# Patient Record
Sex: Female | Born: 1940 | Race: Black or African American | Hispanic: No | Smoking: Current every day smoker
Health system: Southern US, Community
[De-identification: ages and names within clinical notes are randomized; demographics above are authoritative.]

## PROBLEM LIST (undated history)

## (undated) DIAGNOSIS — K219 Gastro-esophageal reflux disease without esophagitis: Secondary | ICD-10-CM

## (undated) DIAGNOSIS — F32A Depression, unspecified: Secondary | ICD-10-CM

## (undated) DIAGNOSIS — G5600 Carpal tunnel syndrome, unspecified upper limb: Secondary | ICD-10-CM

## (undated) DIAGNOSIS — I10 Essential (primary) hypertension: Secondary | ICD-10-CM

## (undated) DIAGNOSIS — M4800 Spinal stenosis, site unspecified: Secondary | ICD-10-CM

## (undated) DIAGNOSIS — F329 Major depressive disorder, single episode, unspecified: Secondary | ICD-10-CM

## (undated) DIAGNOSIS — D649 Anemia, unspecified: Secondary | ICD-10-CM

## (undated) DIAGNOSIS — M1990 Unspecified osteoarthritis, unspecified site: Secondary | ICD-10-CM

## (undated) DIAGNOSIS — E119 Type 2 diabetes mellitus without complications: Secondary | ICD-10-CM

## (undated) HISTORY — DX: Carpal tunnel syndrome, unspecified upper limb: G56.00

## (undated) HISTORY — PX: BACK SURGERY: SHX140

---

## 2009-09-19 ENCOUNTER — Encounter: Admission: RE | Admit: 2009-09-19 | Discharge: 2009-09-19 | Payer: Self-pay | Admitting: Neurosurgery

## 2009-10-14 DIAGNOSIS — E113299 Type 2 diabetes mellitus with mild nonproliferative diabetic retinopathy without macular edema, unspecified eye: Secondary | ICD-10-CM | POA: Insufficient documentation

## 2009-10-15 ENCOUNTER — Encounter: Admission: RE | Admit: 2009-10-15 | Discharge: 2009-10-15 | Payer: Self-pay | Admitting: Neurosurgery

## 2010-12-02 ENCOUNTER — Other Ambulatory Visit: Payer: Self-pay | Admitting: Obstetrics

## 2010-12-02 DIAGNOSIS — N6310 Unspecified lump in the right breast, unspecified quadrant: Secondary | ICD-10-CM

## 2017-02-16 ENCOUNTER — Encounter (HOSPITAL_COMMUNITY): Payer: Self-pay | Admitting: Emergency Medicine

## 2017-02-16 ENCOUNTER — Emergency Department (HOSPITAL_COMMUNITY)
Admission: EM | Admit: 2017-02-16 | Discharge: 2017-02-16 | Disposition: A | Discharge status: Home / Self Care | Payer: Medicare HMO | Attending: Emergency Medicine | Admitting: Emergency Medicine

## 2017-02-16 DIAGNOSIS — M5432 Sciatica, left side: Secondary | ICD-10-CM | POA: Diagnosis not present

## 2017-02-16 DIAGNOSIS — M545 Low back pain: Secondary | ICD-10-CM | POA: Diagnosis present

## 2017-02-16 HISTORY — DX: Spinal stenosis, site unspecified: M48.00

## 2017-02-16 HISTORY — DX: Essential (primary) hypertension: I10

## 2017-02-16 HISTORY — DX: Gastro-esophageal reflux disease without esophagitis: K21.9

## 2017-02-16 HISTORY — DX: Unspecified osteoarthritis, unspecified site: M19.90

## 2017-02-16 HISTORY — DX: Depression, unspecified: F32.A

## 2017-02-16 HISTORY — DX: Anemia, unspecified: D64.9

## 2017-02-16 HISTORY — DX: Major depressive disorder, single episode, unspecified: F32.9

## 2017-02-16 HISTORY — DX: Type 2 diabetes mellitus without complications: E11.9

## 2017-02-16 MED ORDER — METHOCARBAMOL 1000 MG/10ML IJ SOLN
1000.0000 mg | Freq: Once | INTRAVENOUS | Status: AC
  Administered 2017-02-16: 1000 mg via INTRAVENOUS
  Filled 2017-02-16 (×2): qty 10

## 2017-02-16 MED ORDER — METHOCARBAMOL 500 MG PO TABS: 500.0000 mg | ORAL_TABLET | Freq: Three times a day (TID) | ORAL | 0 refills | Status: DC | PRN

## 2017-02-16 MED ORDER — ONDANSETRON HCL 4 MG/2ML IJ SOLN
4.0000 mg | Freq: Once | INTRAMUSCULAR | Status: AC
  Administered 2017-02-16: 4 mg via INTRAVENOUS
  Filled 2017-02-16: qty 2

## 2017-02-16 MED ORDER — HYDROCODONE-ACETAMINOPHEN 5-325 MG PO TABS: 1.0000 | ORAL_TABLET | ORAL | 0 refills | Status: DC | PRN

## 2017-02-16 MED ORDER — MORPHINE SULFATE (PF) 4 MG/ML IV SOLN
4.0000 mg | INTRAVENOUS | Status: DC | PRN
  Administered 2017-02-16 (×2): 4 mg via INTRAVENOUS
  Filled 2017-02-16 (×2): qty 1

## 2017-02-16 NOTE — ED Triage Notes (Addendum)
Pt was at an adult daycare today when she started having lower back pain that radiates down her L leg. States she has a hx of spinal stenosis. Pt was given hydrocodone and gabapentin PTA.  Alert and oriented.

## 2017-02-16 NOTE — ED Provider Notes (Signed)
[facility]-EMERGENCY DEPT Provider Note   CSN: [id number] Arrival date & time: 02/16/17  1257     History   Chief Complaint Chief Complaint  Patient presents with  . Back Pain    HPI Sydney Gilbert is a 76 y.o. female.  Patient has PMH of arthritis, spinal stenosis, and DM who presents for evaluation of acute onset lower back pain which radiates to her leg. She states that she wasn't doing anything specific when the pain came, it just started all of the sudden when she woke up this AM. She states that the pain is so severe that she cant stand up on her own and can't walk. The sharp pain, rated as a 10/10, starts in her lower back and travels all the way down her leg to her toes. This pain is constant and severe. She endorses some numbness in her LLE. She denies loss of continence, lower extremity weakness, fevers, chills, cold symptoms, personal history of cancer, and IV drug use. She states that her oral medication has done nothing to help her pain.     Per chart review over the past month the patient has been seen in the clinic and in the emergency room for evaluation of her chronic back pain. Her symptoms from those visits were very similar to today's presentation. At her clinic visit on 12/14/16 the patient's physician prescribed Norco 10-325 to take 4 times a day and a week long steroid taper. At her last ED visit on 01/18/17 the patient had imaging which showed a broken pedical screw, which was not thought to contribute to her sciatic pain. She was discharged with robaxin and week long prednisone course to augment her home pain regimen.   Past Medical History:  Diagnosis Date  . Anemia   . Arthritis   . Depression   . Diabetes mellitus without complication (HCC)   . GERD (gastroesophageal reflux disease)   . Hypertension   . Spinal stenosis     There are no active problems to display for this patient.   No past surgical history on file.  OB History    No data available       Home Medications    Prior to Admission medications   Not on File    Family History History reviewed. No pertinent family history.  Social History Social History  Substance Use Topics  . Smoking status: Not on file  . Smokeless tobacco: Not on file  . Alcohol use Not on file     Allergies   Metformin and related and Penicillins   Review of Systems Review of Systems  Constitutional: Negative for chills and fever.  HENT: Negative for congestion, sinus pain and sinus pressure.   Respiratory: Negative for cough and shortness of breath.   Cardiovascular: Negative for chest pain, palpitations and leg swelling.  Gastrointestinal: Negative for abdominal distention, anal bleeding, blood in stool, diarrhea, nausea and vomiting.  Genitourinary: Negative for decreased urine volume, difficulty urinating, dyspareunia, frequency and urgency.  Musculoskeletal: Positive for arthralgias, back pain, gait problem (walks with walker, difficulty with ambulation 2/2 pain today) and myalgias. Negative for neck stiffness.  Skin: Negative for rash and wound.  Neurological: Positive for numbness (LLE). Negative for facial asymmetry, weakness, light-headedness and headaches.   Physical Exam Updated Vital Signs BP 132/68   Pulse 81   Temp 98.1 F (36.7 C)   Resp 20   SpO2 99%   Physical Exam  Constitutional: She appears well-developed and well-nourished. No distress.  HENT:  Mouth/Throat: Oropharynx is clear and moist.  Cardiovascular: Normal rate, regular rhythm and intact distal pulses.  Exam reveals no friction rub.   No murmur heard. Pulmonary/Chest: Effort normal. No respiratory distress. She has no wheezes.  Abdominal: Soft. She exhibits no distension and no mass. There is no tenderness. There is no guarding.  Musculoskeletal: She exhibits tenderness (extreme tenderness with palpation of paraspinous muslces in lower back, L buttock and down L leg). She exhibits no edema (of bilateral  lower extremities).  Neurological:  Patient would not cooperate with formal strength testing 2/2. Patient's upper and lower extremity strength grossly intact, as patient was observed ambulating to bathroom. Sensation to light touch intact in bilateral lower extremities.   Skin: Skin is warm and dry. Capillary refill takes less than 2 seconds. No rash noted. No erythema.   ED Treatments / Results  Labs (all labs ordered are listed, but only abnormal results are displayed) Labs Reviewed - No data to display  EKG  EKG Interpretation None      Radiology No results found.  Procedures Procedures (including critical care time)  Medications Ordered in ED Medications  ondansetron (ZOFRAN) injection 4 mg (not administered)  morphine 4 MG/ML injection 4 mg (not administered)  methocarbamol (ROBAXIN) 1,000 mg in dextrose 5 % 50 mL IVPB (not administered)    Initial Impression / Assessment and Plan / ED Course  I have reviewed the triage vital signs and the nursing notes.  Pertinent labs & imaging results that were available during my care of the patient were reviewed by me and considered in my medical decision making (see chart for details).  Patient has history of chronic back pain which has gotten acutely worse all of the sudden. She does not endorse red flag symptoms concerning for cauda equina, including loss of continence, saddle paraesthesia, personal history of cancer, personal history of IV drug use, and fevers/chills. Given this, imaging is not indicated at this time. Her description of her pain and physical exam is most consistent with sciatica. The patient will be given IV morphine, robaxin, and zofran to help control her pain.   She will be encouraged to follow up with her [facility] PM[facility] for management of her acute exacerbations which appear to have increased in frequency and intensity since her last clinic visit on 12/14/16.   Patient was discussed with Dr. Jeraldine Loots at  signout for transfer of care.  Final Clinical Impressions(s) / ED Diagnoses   Final diagnoses:  Sciatica of left side    New Prescriptions New Prescriptions   No medications on file     [name], MD 02/16/17 1555    Rolland Porter, MD 02/26/17 [phone]

## 2017-02-16 NOTE — ED Notes (Signed)
Patient was alert, oriented and stable upon discharge. RN went over AVS and patient had no further questions.  Patient was instructed not to drive or operate heavy machinery on narcotic pain medication. Patient was advised to avoid driving while taking prescribed muscle relaxers.   

## 2017-02-16 NOTE — ED Provider Notes (Signed)
Patient seen and evaluated with resident. History of chronic low back pain. Multiple previous surgeries. Hardware fixation with fracture pedicle screw. Chronic spinal stenosis. Occasional episodes of acute exacerbation. Has pain today left back, left buttock, left hamstring. Episodic in paroxysmal and seems consistent with muscular spasm. No radicular pattern. No neurological loss. She is ambulatory although in pain. Plan is pink and symptom control and outpatient treatment. No indication for studies at this time.  Plan IV pain medication, Robaxin.   Rolland PorterJames, Bion Todorov, MD 02/16/17 [phone]

## 2017-02-16 NOTE — Discharge Instructions (Addendum)
Medications as prescribed.  Follow up with your primary care physician regarding any ongoing symptoms

## 2017-04-19 ENCOUNTER — Emergency Department (HOSPITAL_COMMUNITY)
Admission: EM | Admit: 2017-04-19 | Discharge: 2017-04-19 | Disposition: A | Discharge status: Home / Self Care | Payer: Medicare HMO | Attending: Emergency Medicine | Admitting: Emergency Medicine

## 2017-04-19 ENCOUNTER — Emergency Department (HOSPITAL_COMMUNITY): Payer: Medicare HMO

## 2017-04-19 DIAGNOSIS — M5442 Lumbago with sciatica, left side: Secondary | ICD-10-CM | POA: Insufficient documentation

## 2017-04-19 DIAGNOSIS — G8929 Other chronic pain: Secondary | ICD-10-CM

## 2017-04-19 DIAGNOSIS — Z7984 Long term (current) use of oral hypoglycemic drugs: Secondary | ICD-10-CM | POA: Diagnosis not present

## 2017-04-19 DIAGNOSIS — I10 Essential (primary) hypertension: Secondary | ICD-10-CM | POA: Diagnosis not present

## 2017-04-19 DIAGNOSIS — M79605 Pain in left leg: Secondary | ICD-10-CM | POA: Diagnosis present

## 2017-04-19 DIAGNOSIS — E119 Type 2 diabetes mellitus without complications: Secondary | ICD-10-CM | POA: Insufficient documentation

## 2017-04-19 DIAGNOSIS — M5432 Sciatica, left side: Secondary | ICD-10-CM

## 2017-04-19 LAB — BASIC METABOLIC PANEL
Anion gap: 7 (ref 5–15)
BUN: 20 mg/dL (ref 6–20)
CHLORIDE: 107 mmol/L (ref 101–111)
CO2: 26 mmol/L (ref 22–32)
Calcium: 9.5 mg/dL (ref 8.9–10.3)
Creatinine, Ser: 0.71 mg/dL (ref 0.44–1.00)
GFR calc non Af Amer: >60 mL/min (ref >60)
Glucose, Bld: 84 mg/dL (ref 65–99)
POTASSIUM: 4.1 mmol/L (ref 3.5–5.1)
SODIUM: 140 mmol/L (ref 135–145)

## 2017-04-19 LAB — CBC WITH DIFFERENTIAL/PLATELET
BASOS PCT: 0 %
Basophils Absolute: 0 10*3/uL (ref 0.0–0.1)
EOS ABS: 0.3 10*3/uL (ref 0.0–0.7)
Eosinophils Relative: 4 %
HCT: 33.9 % — ABNORMAL LOW (ref 36.0–46.0)
HEMOGLOBIN: 10.8 g/dL — AB (ref 12.0–15.0)
LYMPHS ABS: 3.1 10*3/uL (ref 0.7–4.0)
Lymphocytes Relative: 45 %
MCH: 27.3 pg (ref 26.0–34.0)
MCHC: 31.9 g/dL (ref 30.0–36.0)
MCV: 85.6 fL (ref 78.0–100.0)
Monocytes Absolute: 0.3 10*3/uL (ref 0.1–1.0)
Monocytes Relative: 5 %
NEUTROS PCT: 46 %
Neutro Abs: 3.1 10*3/uL (ref 1.7–7.7)
Platelets: 176 10*3/uL (ref 150–400)
RBC: 3.96 MIL/uL (ref 3.87–5.11)
RDW: 14.7 % (ref 11.5–15.5)
WBC: 6.7 10*3/uL (ref 4.0–10.5)

## 2017-04-19 LAB — URINALYSIS, ROUTINE W REFLEX MICROSCOPIC
Bilirubin Urine: NEGATIVE
Glucose, UA: NEGATIVE mg/dL
Hgb urine dipstick: NEGATIVE
KETONES UR: NEGATIVE mg/dL
LEUKOCYTES UA: NEGATIVE
NITRITE: NEGATIVE
PROTEIN: NEGATIVE mg/dL
Specific Gravity, Urine: 1.009 (ref 1.005–1.030)
pH: 5 (ref 5.0–8.0)

## 2017-04-19 LAB — PROTIME-INR
INR: 0.85
PROTHROMBIN TIME: 11.6 s (ref 11.4–15.2)

## 2017-04-19 MED ORDER — MORPHINE SULFATE (PF) 4 MG/ML IV SOLN
4.0000 mg | Freq: Once | INTRAVENOUS | Status: AC
  Administered 2017-04-19: 4 mg via INTRAVENOUS
  Filled 2017-04-19: qty 1

## 2017-04-19 NOTE — ED Notes (Signed)
[facility] contacted regarding transport.

## 2017-04-19 NOTE — ED Notes (Signed)
[facility] Expected date:  Expected time:  Means of arrival:  Comments: 76 yo f back pain

## 2017-04-19 NOTE — ED Triage Notes (Signed)
[facility] from [place]. Reports of left leg pain that radiates into left flank. Described as spasms. Hx of same.

## 2017-04-19 NOTE — ED Notes (Signed)
ED Provider at bedside. 

## 2017-04-19 NOTE — Consult Note (Signed)
Reason for Consult:back and left lower extremity pain, chronic with recent exacerbation Referring Physician: tegeler, c  Sydney Gilbert is an 76 y.o. female.  HPI: with a long history of back and lower extremity pain, especially on the left. Was seen in the [facility] ED on 04/03/17 for back and left lower extremity pain, she underwent a lumbar injection on 03/21/2017, for lower extremity pain by Dr. Wilford Grist, she was in the [facility] ED on 01/18/2017 for left lower extremity pain and back pain. She states in these encounters and today that she has pain radiating down the left leg, feels she has a catch in her when changing positions. Today she states her muscle spasms are causing great pain and that is new. She has been told that if her latest injection done in October by her spine physician that she would need surgery. She reports weakness in the left plantar flexors, dorsiflexors, and thigh for ~two months.   Past Medical History:  Diagnosis Date  . Anemia   . Arthritis   . Depression   . Diabetes mellitus without complication ([place])   . GERD (gastroesophageal reflux disease)   . Hypertension   . Spinal stenosis     No past surgical history on file.  No family history on file.  Social History:  has no tobacco, alcohol, and drug history on file.  Allergies:  Allergies  Allergen Reactions  . Metformin And Related   . Penicillins Rash    Has patient had a PCN reaction causing immediate rash, facial/tongue/throat swelling, SOB or lightheadedness with hypotension: Yes Has patient had a PCN reaction causing severe rash involving mucus membranes or skin necrosis: No Has patient had a PCN reaction that required hospitalization: NO Has patient had a PCN reaction occurring within the last 10 years: No If all of the above answers are "NO", then may proceed with Cephalosporin use.   . Varenicline Itching    Medications: I have reviewed the patient's current medications.  Results for orders placed  or performed during the hospital encounter of 04/19/17 (from the past 48 hour(s))  CBC with Differential     Status: Abnormal   Collection Time: 04/19/17 11:22 AM  Result Value Ref Range   WBC 6.7 4.0 - 10.5 K/uL   RBC 3.96 3.87 - 5.11 MIL/uL   Hemoglobin 10.8 (L) 12.0 - 15.0 g/dL   HCT 33.9 (L) 36.0 - 46.0 %   MCV 85.6 78.0 - 100.0 fL   MCH 27.3 26.0 - 34.0 pg   MCHC 31.9 30.0 - 36.0 g/dL   RDW 14.7 11.5 - 15.5 %   Platelets 176 150 - 400 K/uL   Neutrophils Relative % 46 %   Neutro Abs 3.1 1.7 - 7.7 K/uL   Lymphocytes Relative 45 %   Lymphs Abs 3.1 0.7 - 4.0 K/uL   Monocytes Relative 5 %   Monocytes Absolute 0.3 0.1 - 1.0 K/uL   Eosinophils Relative 4 %   Eosinophils Absolute 0.3 0.0 - 0.7 K/uL   Basophils Relative 0 %   Basophils Absolute 0.0 0.0 - 0.1 K/uL  Basic metabolic panel     Status: None   Collection Time: 04/19/17 11:22 AM  Result Value Ref Range   Sodium 140 135 - 145 mmol/L   Potassium 4.1 3.5 - 5.1 mmol/L   Chloride 107 101 - 111 mmol/L   CO2 26 22 - 32 mmol/L   Glucose, Bld 84 65 - 99 mg/dL   BUN 20 6 - 20 mg/dL  Creatinine, Ser 0.71 0.44 - 1.00 mg/dL   Calcium 9.5 8.9 - 10.3 mg/dL   GFR calc non Af Amer >60 >60 mL/min   GFR calc Af Amer >60 >60 mL/min    Comment: (NOTE) The eGFR has been calculated using the CKD EPI equation. This calculation has not been validated in all clinical situations. eGFR's persistently <60 mL/min signify possible Chronic Kidney Disease.    Anion gap 7 5 - 15  Protime-INR     Status: None   Collection Time: 04/19/17 11:22 AM  Result Value Ref Range   Prothrombin Time 11.6 11.4 - 15.2 seconds   INR 0.85   Urinalysis, Routine w reflex microscopic     Status: Abnormal   Collection Time: 04/19/17 12:00 PM  Result Value Ref Range   Color, Urine STRAW (A) YELLOW   APPearance CLEAR CLEAR   Specific Gravity, Urine 1.009 1.005 - 1.030   pH 5.0 5.0 - 8.0   Glucose, UA NEGATIVE NEGATIVE mg/dL   Hgb urine dipstick NEGATIVE  NEGATIVE   Bilirubin Urine NEGATIVE NEGATIVE   Ketones, ur NEGATIVE NEGATIVE mg/dL   Protein, ur NEGATIVE NEGATIVE mg/dL   Nitrite NEGATIVE NEGATIVE   Leukocytes, UA NEGATIVE NEGATIVE    Mr Lumbar Spine Wo Contrast  Result Date: 04/19/2017 CLINICAL DATA:  Severe low back pain radiating into the left leg, with new subjective weakness. Occasional overnight urinary incontinence. EXAM: MRI LUMBAR SPINE WITHOUT CONTRAST TECHNIQUE: Multiplanar, multisequence MR imaging of the lumbar spine was performed. No intravenous contrast was administered. COMPARISON:  Lumbar spine x-rays dated April 03, 2017. MRI lumbar spine dated April 08, 2016. FINDINGS: Segmentation:  Standard. Alignment: Unchanged straightening of the normal lumbar lordosis. 3 mm anterolisthesis of T12 on L1, not significantly changed. Vertebrae: Prior L4-S1 posterior fusion. No fracture, evidence of discitis, or focal bone lesion. Mild chronic vertebral body height loss at L1 and L2 is unchanged. Conus medullaris: Extends to the T12-L1 level and appears normal. Paraspinal and other soft tissues: Negative. Disc levels: T11-T12: Only seen on the sagittal images. Small diffuse disc bulge with mild bilateral facet arthropathy and ligamentum flavum hypertrophy. No significant central spinal canal or neuroforaminal stenosis. T12-L1: Anterolisthesis and diffuse disc bulge with bilateral facet arthropathy and ligamentum flavum hypertrophy resulting in moderate central spinal canal stenosis and moderate left and mild right neuroforaminal stenosis, similar to prior study. L1-L2: Diffuse disc bulge and moderate bilateral facet arthropathy with facet joint effusions and severe ligamentum flavum hypertrophy resulting in severe central spinal canal stenosis, similar to prior study. Mild bilateral neuroforaminal stenosis is also unchanged. L2-L3: Small diffuse disc bulge, asymmetric to the right, and moderate bilateral facet arthropathy with mild ligamentum  flavum hypertrophy resulting in moderate to severe central spinal canal stenosis, similar to prior study. Mild bilateral neuroforaminal stenosis is unchanged. L3-L4: Susceptibility artifact from nearby in posterior fusion hardware somewhat limits evaluation. No high-grade spinal canal or neuroforaminal stenosis. L4-L5: Prior posterior fusion. The spinal canal appears patent. The neural foramina are not well evaluated due to susceptibility artifact. L5-S1: Prior posterior fusion. The spinal canal appears patent. The neural foramina are not well evaluated due to susceptibility artifact. IMPRESSION: 1. Postsurgical changes related to prior L4-S1 posterior fusion with susceptibility artifact limiting evaluation at these levels. The spinal canal appears patent at L3-L4 and L4-L5. 2. Multilevel degenerative changes above the fusion with resultant severe central spinal canal stenosis at L1-L2 and moderate to severe central spinal canal stenosis at L2-L3. There is crowding and probable compression of  the cauda equina nerve roots at these levels. findings are overall similar to prior study. Electronically Signed   By: Titus Dubin M.D.   On: 04/19/2017 13:19    Review of Systems  HENT: Negative.   Eyes: Negative.   Musculoskeletal: Positive for back pain, falls and joint pain.  Neurological: Positive for focal weakness and weakness.   Blood pressure (!) 122/54, pulse 92, temperature 98.8 F (37.1 C), temperature source Oral, resp. rate 17, SpO2 95 %. Physical Exam  Constitutional: She is oriented to person, place, and time. She appears well-developed and well-nourished. She appears distressed.  HENT:  Head: Normocephalic and atraumatic.  Right Ear: External ear normal.  Left Ear: External ear normal.  Eyes: Conjunctivae and EOM are normal. Pupils are equal, round, and reactive to light.  Neck: Normal range of motion. Neck supple.  Cardiovascular: Normal rate and regular rhythm.  GI: Soft. Bowel sounds  are normal.  Neurological: She is alert and oriented to person, place, and time. A sensory deficit is present. No cranial nerve deficit. Coordination normal. She displays no Babinski's sign on the right side. She displays no Babinski's sign on the left side.  Decreased light touch left foot Decreased proprioception left foot Weakness 4/5 dorsiflexors, plantar flexors, hamstrings. Give way weakness ~5-/5 hip flexors Normal strength right lower extremity Gait not assessed, normal coordination lower extremities, upper extremities Shoes and socks removed to perform sensory exam  Skin: Skin is warm and dry.    Assessment/Plan: Ms. Inabinet presents with long standing back and left lower extremity pain. She has a physician whom treats her back pain. There does not appear to be a neurologic crisis at this time. Her bladder scan is not consistent with overflow incontinence with low less than 100cc volumes. Her complaints today are very similar to her complaints at [facility] on 04/03/2017. Her MRI scan shows severe stenosis, which is unchanged since last year, and little changed since 03/2014. I recommend that Ms. Janeway be seen by her physician Dr. Wilford Grist, she does have an appt with Louann Liv, PA-C on 05/02/2017 No indications for emergent treatment, certainly no surgery indicated emergently at this time. Again she reports weakness in the left lower extremity for the last two months.  Would aggressively treat her spasms.  Tahirih Lair L 04/19/2017, 4:23 PM

## 2017-04-19 NOTE — ED Provider Notes (Signed)
[facility]-EMERGENCY DEPT Provider Note   CSN: [id number] Arrival date & time: 04/19/17  1046     History   Chief Complaint Chief Complaint  Patient presents with  . Leg Pain    HPI Adelin Ventrella is a 76 y.o. female.  The history is provided by the patient and medical records. No language interpreter was used.  Back Pain   This is a recurrent problem. The current episode started yesterday. The problem occurs constantly. The problem has been rapidly worsening. The pain is present in the lumbar spine. The quality of the pain is described as stabbing. The pain radiates to the left thigh. The pain is at a severity of 10/10. The pain is severe. The symptoms are aggravated by bending and certain positions. The pain is the same all the time. Associated symptoms include numbness, bladder incontinence (at night), leg pain and weakness. Pertinent negatives include no chest pain, no fever, no headaches, no abdominal pain, no bowel incontinence, no perianal numbness, no dysuria, no pelvic pain and no tingling. She has tried muscle relaxants and analgesics for the symptoms. The treatment provided no relief.    Past Medical History:  Diagnosis Date  . Anemia   . Arthritis   . Depression   . Diabetes mellitus without complication (HCC)   . GERD (gastroesophageal reflux disease)   . Hypertension   . Spinal stenosis     There are no active problems to display for this patient.   No past surgical history on file.  OB History    No data available       Home Medications    Prior to Admission medications   Medication Sig Start Date End Date Taking? Authorizing Provider  acetaminophen (TYLENOL) 500 MG tablet Take 1,000 mg by mouth every 6 (six) hours as needed for moderate pain.     [provider]  amLODipine (NORVASC) 10 MG tablet Frequency:   Dosage:0   MG  Instructions:  Note:TAKE 1 TABLET BY MOUTH EVERY DAY FOR HYPERTENSION 09/04/10   [provider]  Calcium Carb-Cholecalciferol (CALCIUM-VITAMIN D) 500-200 MG-UNIT tablet Take 1 tablet by mouth daily.     [provider]  gabapentin (NEURONTIN) 600 MG tablet TAKE 1 TABLET FOUR TIMES DAILY 03/15/16   [provider]  glipiZIDE (GLUCOTROL) 10 MG tablet TK 1 T PO ONCE D B A MEAL FOR 30 DAYS 05/11/15   [provider]  HYDROcodone-acetaminophen (NORCO) 10-325 MG tablet TK 1 T PO QID 01/17/17   [provider]  HYDROcodone-acetaminophen (NORCO/VICODIN) 5-325 MG tablet Take 1 tablet by mouth every 4 (four) hours as needed. 02/16/17   Rolland Porter, MD  hydrOXYzine (VISTARIL) 25 MG capsule TK 1 C PO QID PRN FOR ANIXIETY 08/03/16   [provider]  lisinopril (PRINIVIL,ZESTRIL) 20 MG tablet TK 1 T PO QD 12/26/14   [provider]  methocarbamol (ROBAXIN) 500 MG tablet Take 1 tablet (500 mg total) by mouth 3 (three) times daily between meals as needed. 02/16/17   Rolland Porter, MD  Multiple Vitamins-Minerals (MULTIVITAMIN ADULTS) TABS Take 1 tablet by mouth daily.    [provider]  omeprazole (PRILOSEC) 40 MG capsule TK 1 C PO BID 01/17/17   [provider]  polyethylene glycol powder (GLYCOLAX/MIRALAX) powder Take 1 Container by mouth every other day.  12/05/13   [provider]  potassium chloride (MICRO-K) 10 MEQ CR capsule Frequency:Twice daily   Dosage:2   MEQ  Instructions:  Note:TAKE  1 CAPSULE TWICE DAILY WITH MEALS. 01/22/11   [provider]  sertraline (ZOLOFT) 100 MG tablet Take 100 mg by mouth daily.     [provider]  simvastatin (ZOCOR) 10 MG tablet Frequency:   Dosage:0   MG  Instructions:  Note:TAKE 1 TABLET BY MOUTH DAILY 10/01/10   [provider]    Family History No family history on file.  Social History Social History   Tobacco Use  . Smoking status: Not on file  Substance Use Topics  . Alcohol use: Not on file  . Drug use: Not on file     Allergies     Metformin and related and Penicillins   Review of Systems Review of Systems  Constitutional: Negative for chills, diaphoresis, fatigue and fever.  HENT: Negative for congestion.   Respiratory: Negative for apnea, chest tightness, shortness of breath and wheezing.   Cardiovascular: Negative for chest pain.  Gastrointestinal: Negative for abdominal pain, bowel incontinence, constipation, diarrhea, nausea and vomiting.  Genitourinary: Positive for bladder incontinence (at night). Negative for dysuria, flank pain and pelvic pain.  Musculoskeletal: Positive for back pain. Negative for neck pain and neck stiffness.  Neurological: Positive for weakness and numbness. Negative for tingling, tremors, light-headedness and headaches.  Psychiatric/Behavioral: Positive for agitation.  All other systems reviewed and are negative.    Physical Exam Updated Vital Signs BP (!) 117/93 (BP Location: Left Arm)   Pulse 89   Temp 98.8 F (37.1 C) (Oral)   Resp 17   SpO2 100%   Physical Exam  Constitutional: She is oriented to person, place, and time. She appears well-developed and well-nourished. No distress.  HENT:  Head: Normocephalic.  Mouth/Throat: Oropharynx is clear and moist. No oropharyngeal exudate.  Eyes: EOM are normal. Pupils are equal, round, and reactive to light.  Neck: Normal range of motion.  Cardiovascular: Normal rate.  No murmur heard. Pulmonary/Chest: Effort normal. No respiratory distress. She has no wheezes. She has no rales. She exhibits no tenderness.  Abdominal: Soft. There is no tenderness.  Musculoskeletal: She exhibits tenderness. She exhibits no edema.       Lumbar back: She exhibits tenderness and pain.       Back:  Neurological: She is alert and oriented to person, place, and time. She displays no tremor. A sensory deficit is present. No cranial nerve deficit. She exhibits normal muscle tone. GCS eye subscore is 4. GCS verbal subscore is 5. GCS motor subscore is 6.   Subjective numbness and left leg.  No weakness on my exam but she reports subjective weakness in left leg compared to right.  Skin: Capillary refill takes less than 2 seconds. No rash noted. She is not diaphoretic. No erythema.  Nursing note and vitals reviewed.    ED Treatments / Results  Labs (all labs ordered are listed, but only abnormal results are displayed) Labs Reviewed  CBC WITH DIFFERENTIAL/PLATELET - Abnormal; Notable for the following components:      Result Value   Hemoglobin 10.8 (*)    HCT 33.9 (*)    All other components within normal limits  URINALYSIS, ROUTINE W REFLEX MICROSCOPIC - Abnormal; Notable for the following components:   Color, Urine STRAW (*)    All other components within normal limits  BASIC METABOLIC PANEL  PROTIME-INR    EKG  EKG Interpretation None       Radiology Mr Lumbar Spine Wo Contrast  Result Date: 04/19/2017 CLINICAL DATA:  Severe low back pain radiating into  the left leg, with new subjective weakness. Occasional overnight urinary incontinence. EXAM: MRI LUMBAR SPINE WITHOUT CONTRAST TECHNIQUE: Multiplanar, multisequence MR imaging of the lumbar spine was performed. No intravenous contrast was administered. COMPARISON:  Lumbar spine x-rays dated April 03, 2017. MRI lumbar spine dated April 08, 2016. FINDINGS: Segmentation:  Standard. Alignment: Unchanged straightening of the normal lumbar lordosis. 3 mm anterolisthesis of T12 on L1, not significantly changed. Vertebrae: Prior L4-S1 posterior fusion. No fracture, evidence of discitis, or focal bone lesion. Mild chronic vertebral body height loss at L1 and L2 is unchanged. Conus medullaris: Extends to the T12-L1 level and appears normal. Paraspinal and other soft tissues: Negative. Disc levels: T11-T12: Only seen on the sagittal images. Small diffuse disc bulge with mild bilateral facet arthropathy and ligamentum flavum hypertrophy. No significant central spinal canal or neuroforaminal  stenosis. T12-L1: Anterolisthesis and diffuse disc bulge with bilateral facet arthropathy and ligamentum flavum hypertrophy resulting in moderate central spinal canal stenosis and moderate left and mild right neuroforaminal stenosis, similar to prior study. L1-L2: Diffuse disc bulge and moderate bilateral facet arthropathy with facet joint effusions and severe ligamentum flavum hypertrophy resulting in severe central spinal canal stenosis, similar to prior study. Mild bilateral neuroforaminal stenosis is also unchanged. L2-L3: Small diffuse disc bulge, asymmetric to the right, and moderate bilateral facet arthropathy with mild ligamentum flavum hypertrophy resulting in moderate to severe central spinal canal stenosis, similar to prior study. Mild bilateral neuroforaminal stenosis is unchanged. L3-L4: Susceptibility artifact from nearby in posterior fusion hardware somewhat limits evaluation. No high-grade spinal canal or neuroforaminal stenosis. L4-L5: Prior posterior fusion. The spinal canal appears patent. The neural foramina are not well evaluated due to susceptibility artifact. L5-S1: Prior posterior fusion. The spinal canal appears patent. The neural foramina are not well evaluated due to susceptibility artifact. IMPRESSION: 1. Postsurgical changes related to prior L4-S1 posterior fusion with susceptibility artifact limiting evaluation at these levels. The spinal canal appears patent at L3-L4 and L4-L5. 2. Multilevel degenerative changes above the fusion with resultant severe central spinal canal stenosis at L1-L2 and moderate to severe central spinal canal stenosis at L2-L3. There is crowding and probable compression of the cauda equina nerve roots at these levels. findings are overall similar to prior study. Electronically Signed   By: Obie DredgeWilliam T Derry M.D.   On: 04/19/2017 13:19    Procedures Procedures (including critical care time)  Medications Ordered in ED Medications  morphine 4 MG/ML injection 4  mg (4 mg Intravenous Given 04/19/17 1215)  morphine 4 MG/ML injection 4 mg (4 mg Intravenous Given 04/19/17 1527)     Initial Impression / Assessment and Plan / ED Course  I have reviewed the triage vital signs and the nursing notes.  Pertinent labs & imaging results that were available during my care of the patient were reviewed by me and considered in my medical decision making (see chart for details).     Nicholaus Bloomatricia Eifert is a 76 y.o. female with a past medical history significant for hypertension, diabetes, spinal stenosis status post 4 lumbar surgeries in the 90s, depression, and arthritis who presents with left back pain and left leg pain.  Patient reports that she has had a history of chronic low back pain but says it is worsened over the last few days.  She says that despite taking her home medications of pain medicine and muscle relaxants, the symptoms have worsened.  She denies any new falls or injuries but says that she does have a new weakness and  numbness in the left leg.  She denies right leg troubles.  She says that she does have occasional urinary incontinence at night.  She denies any bowel incontinence.  She denies any fevers, chills, nausea, vomiting, chest pain or abdominal pain.  She denies any other complaints.  She describes her pain as 10 out of 10 and radiating down her left leg.  She says it is worse with palpation or movement.  She denies other complaints.  On exam, patient has tenderness across her low back.  Patient has pain with any manipulation of the left leg.  Patient described numbness in the left leg compared to the right.  Patient had symmetric plantarflexion and dorsiflexion on my exam.  Palpable pulses present and DP arteries.  Abdomen and chest nontender.  Lungs clear.  Based on patient's history of spinal surgeries and the new numbness and weakness, will order MRI to further evaluate.  Patient will be given pain medicine during initial workup.  Will have  laboratory testing and urinalysis to look for hematuria or other lab abnormalities.    Anticipate reassessment after pain medications and imaging workup.     Laboratory testing was reassuring.  MRI revealed no significant changes but there was evidence of possible cauda equina compression.  Given this imaging finding, the patient's primary spine/pain management team was called.  Due to the patient's nighttime urinary incontinence, left leg weakness, and left leg numbness, the outpatient team recommended neurosurgery consultation.  Neurosurgery was called who came to see the patient.  After neurosurgery evaluation, they did not feel patient had an acute neurosurgical emergency.  They felt patient was stable for discharge home to continue her outpatient management plan.  Patient's pain had improved after medications.  Given reassuring recommendations by neurosurgery and urged home with follow-up instructions.  Return precautions were also given.  Patient voiced understanding of the plan of care and was discharged in good condition.  Final Clinical Impressions(s) / ED Diagnoses   Final diagnoses:  Left leg pain  Sciatica of left side  Chronic left-sided low back pain with left-sided sciatica    ED Discharge Orders    None     Clinical Impression: 1. Left leg pain   2. Sciatica of left side   3. Chronic left-sided low back pain with left-sided sciatica     Disposition: Discharge  Condition: Good  I have discussed the results, Dx and Tx plan with the pt(& family if present). He/she/they expressed understanding and agree(s) with the plan. Discharge instructions discussed at great length. Strict return precautions discussed and pt &/or family have verbalized understanding of the instructions. No further questions at time of discharge.    This SmartLink is deprecated. Use AVSMEDLIST instead to display the medication list for a patient.  Follow Up: Camie PatienceHayes, Romana S, FNP [address] [place] [address] [phone]     Dawayne CirriSpillane, William, MD [address] [facility] [id number] [phone]        Keziyah Kneale, Canary Brimhristopher J, MD 04/19/17 2031

## 2017-04-19 NOTE — Discharge Instructions (Signed)
Your imaging today was concerning for worsened spine injury however, after our neurosurgery team evaluated you, we feel you are safe for discharge home to follow-up with your spine provider, Dr. Loletta ParishSpillane.  Please call his office to schedule appointment in the next few days.  Please continue your outpatient pain regimen.  If any symptoms change or worsen, please return to the nearest emergency department.

## 2017-04-19 NOTE — ED Notes (Signed)
Bladder scan completed. 3 scans, all center 29ml, 26ml, 33ml

## 2017-04-19 NOTE — ED Notes (Signed)
Patient reports taking 300 mg of Gabapentin and a Hydrocodone pill PTA.

## 2017-04-19 NOTE — ED Notes (Signed)
Patient transported to MRI 

## 2017-06-24 DIAGNOSIS — M419 Scoliosis, unspecified: Secondary | ICD-10-CM | POA: Insufficient documentation

## 2018-01-26 ENCOUNTER — Other Ambulatory Visit: Payer: Self-pay | Admitting: Nurse Practitioner

## 2018-01-26 ENCOUNTER — Ambulatory Visit
Admission: RE | Admit: 2018-01-26 | Discharge: 2018-01-26 | Disposition: A | Discharge status: Home / Self Care | Payer: No Typology Code available for payment source | Source: Ambulatory Visit | Attending: Nurse Practitioner | Admitting: Nurse Practitioner

## 2018-01-26 DIAGNOSIS — R52 Pain, unspecified: Secondary | ICD-10-CM

## 2018-03-30 ENCOUNTER — Other Ambulatory Visit: Payer: Self-pay | Admitting: Physical Medicine and Rehabilitation

## 2018-03-30 DIAGNOSIS — M48061 Spinal stenosis, lumbar region without neurogenic claudication: Secondary | ICD-10-CM

## 2018-04-10 ENCOUNTER — Inpatient Hospital Stay
Admission: RE | Admit: 2018-04-10 | Discharge: 2018-04-10 | Disposition: A | Discharge status: Home / Self Care | Payer: PRIVATE HEALTH INSURANCE | Source: Ambulatory Visit | Attending: Physical Medicine and Rehabilitation | Admitting: Physical Medicine and Rehabilitation

## 2018-04-19 ENCOUNTER — Other Ambulatory Visit: Payer: Self-pay

## 2018-04-19 ENCOUNTER — Emergency Department (HOSPITAL_COMMUNITY)
Admission: EM | Admit: 2018-04-19 | Discharge: 2018-04-19 | Disposition: A | Discharge status: Home / Self Care | Payer: Medicare (Managed Care) | Attending: Emergency Medicine | Admitting: Emergency Medicine

## 2018-04-19 ENCOUNTER — Emergency Department (HOSPITAL_COMMUNITY): Payer: Medicare (Managed Care)

## 2018-04-19 DIAGNOSIS — M545 Low back pain: Secondary | ICD-10-CM | POA: Diagnosis present

## 2018-04-19 DIAGNOSIS — I10 Essential (primary) hypertension: Secondary | ICD-10-CM | POA: Insufficient documentation

## 2018-04-19 DIAGNOSIS — E119 Type 2 diabetes mellitus without complications: Secondary | ICD-10-CM | POA: Diagnosis not present

## 2018-04-19 DIAGNOSIS — M5410 Radiculopathy, site unspecified: Secondary | ICD-10-CM | POA: Diagnosis not present

## 2018-04-19 DIAGNOSIS — Z79899 Other long term (current) drug therapy: Secondary | ICD-10-CM | POA: Diagnosis not present

## 2018-04-19 LAB — BASIC METABOLIC PANEL
Anion gap: 5 (ref 5–15)
BUN: 18 mg/dL (ref 8–23)
CALCIUM: 9.3 mg/dL (ref 8.9–10.3)
CHLORIDE: 111 mmol/L (ref 98–111)
CO2: 24 mmol/L (ref 22–32)
CREATININE: 0.78 mg/dL (ref 0.44–1.00)
Glucose, Bld: 78 mg/dL (ref 70–99)
Potassium: 3.9 mmol/L (ref 3.5–5.1)
SODIUM: 140 mmol/L (ref 135–145)

## 2018-04-19 LAB — URINALYSIS, ROUTINE W REFLEX MICROSCOPIC
Bilirubin Urine: NEGATIVE
Glucose, UA: NEGATIVE mg/dL
Hgb urine dipstick: NEGATIVE
Ketones, ur: NEGATIVE mg/dL
Leukocytes, UA: NEGATIVE
Nitrite: NEGATIVE
PROTEIN: NEGATIVE mg/dL
SPECIFIC GRAVITY, URINE: 1.009 (ref 1.005–1.030)
pH: 5 (ref 5.0–8.0)

## 2018-04-19 LAB — CBC WITH DIFFERENTIAL/PLATELET
Abs Immature Granulocytes: 0.01 10*3/uL (ref 0.00–0.07)
BASOS PCT: 1 %
Basophils Absolute: 0 10*3/uL (ref 0.0–0.1)
EOS PCT: 3 %
Eosinophils Absolute: 0.2 10*3/uL (ref 0.0–0.5)
HCT: 31.4 % — ABNORMAL LOW (ref 36.0–46.0)
HEMOGLOBIN: 9.2 g/dL — AB (ref 12.0–15.0)
Immature Granulocytes: 0 %
LYMPHS PCT: 53 %
Lymphs Abs: 2.9 10*3/uL (ref 0.7–4.0)
MCH: 25.7 pg — ABNORMAL LOW (ref 26.0–34.0)
MCHC: 29.3 g/dL — AB (ref 30.0–36.0)
MCV: 87.7 fL (ref 80.0–100.0)
Monocytes Absolute: 0.4 10*3/uL (ref 0.1–1.0)
Monocytes Relative: 8 %
NRBC: 0 % (ref 0.0–0.2)
Neutro Abs: 2 10*3/uL (ref 1.7–7.7)
Neutrophils Relative %: 35 %
PLATELETS: 162 10*3/uL (ref 150–400)
RBC: 3.58 MIL/uL — AB (ref 3.87–5.11)
RDW: 13.8 % (ref 11.5–15.5)
WBC: 5.5 10*3/uL (ref 4.0–10.5)

## 2018-04-19 MED ORDER — CYCLOBENZAPRINE HCL 10 MG PO TABS
5.0000 mg | ORAL_TABLET | Freq: Once | ORAL | Status: AC
  Administered 2018-04-19: 5 mg via ORAL
  Filled 2018-04-19: qty 1

## 2018-04-19 MED ORDER — MORPHINE SULFATE (PF) 4 MG/ML IV SOLN
6.0000 mg | Freq: Once | INTRAVENOUS | Status: AC
  Administered 2018-04-19: 6 mg via INTRAVENOUS
  Filled 2018-04-19: qty 2

## 2018-04-19 MED ORDER — METHOCARBAMOL 500 MG PO TABS: 500.0000 mg | ORAL_TABLET | Freq: Three times a day (TID) | ORAL | 0 refills | Status: DC | PRN

## 2018-04-19 MED ORDER — HYDROMORPHONE HCL 1 MG/ML IJ SOLN
1.0000 mg | Freq: Once | INTRAMUSCULAR | Status: AC
  Administered 2018-04-19: 1 mg via INTRAVENOUS
  Filled 2018-04-19: qty 1

## 2018-04-19 NOTE — ED Triage Notes (Signed)
Pt to ED from [facility] for evaluation of worsening chronic back pain x1 year. Is awaiting surgery for lumbar compression fractures. [facility] gave her 2 Vicodin at 1330 with no relief. Pain is left sided, travels down left leg. Pt had a fall yesterday but no injuries sustain and this pain is per EMS unrelated.

## 2018-04-19 NOTE — ED Notes (Signed)
ED Provider at bedside. 

## 2018-04-19 NOTE — ED Notes (Signed)
Pt left for MRI.

## 2018-04-19 NOTE — Discharge Instructions (Signed)
Your back pain is due to spinal stenosis.  No evidence of cord compression.  Please take your usual medication along with robaxin as needed for pain.  [facility] for further management of your condition.  Return if you have any concerns.

## 2018-04-19 NOTE — ED Notes (Signed)
Patient transported to X-ray 

## 2018-04-19 NOTE — ED Provider Notes (Signed)
[facility] Provider Note   CSN: [id number] Arrival date & time: 04/19/18  1414     History   Chief Complaint Chief Complaint  Patient presents with  . Back Pain    HPI Sydney Gilbert is a 77 y.o. female.  The history is provided by the patient, the nursing home and medical records. No language interpreter was used.    77 year old female with history of postlaminectomy syndrome of the lumbar region, sacroiliac pain, spinal stenosis, chronic back pain sent here from [facility] for evaluation of worsening chronic back pain.  History obtained through patient physicians, Dr. Mayford KnifeWilliams via phone.  Patient has chronic back pain, she has spinal stenosis which will likely benefit from spinal decompression surgery in which she is currently [facility]  She has been complaining of worsening back pain since yesterday and into the interim she had a fall from wheelchair down to her knees 2 days prior   She normally does not complain of pain so her discomfort today is new.  Pain is sharp, shooting down to both her legs and very uncomfortable.  No report of any fever or chills or dysuria.  Patient denies hitting her head or loss of consciousness.  She did receive her usual pain medication including 10 mg of Vicodin, and Neurontin earlier today without adequate relief.  Her PCP is concerning for potential cord injury and request to have MRI of her lumbar spine.  Otherwise no report of any fever or chills.      Past Medical History:  Diagnosis Date  . Anemia   . Arthritis   . Depression   . Diabetes mellitus without complication (HCC)   . GERD (gastroesophageal reflux disease)   . Hypertension   . Spinal stenosis     There are no active problems to display for this patient.   The histories are not reviewed yet. Please review them in the "History" navigator section and refresh this SmartLink.   OB  History   None      Home Medications    Prior to Admission medications   Medication Sig Start Date End Date Taking? Authorizing Provider  acetaminophen (TYLENOL) 500 MG tablet Take 1,000 mg by mouth every 6 (six) hours as needed for moderate pain.     [provider]  amLODipine (NORVASC) 10 MG tablet Frequency:   Dosage:0   MG  Instructions:  Note:TAKE 1 TABLET BY MOUTH EVERY DAY FOR HYPERTENSION 09/04/10   [provider]  Calcium Carb-Cholecalciferol (CALCIUM-VITAMIN D) 500-200 MG-UNIT tablet Take 1 tablet by mouth daily.     [provider]  cyclobenzaprine (FLEXERIL) 5 MG tablet Take 5 mg 3 (three) times daily as needed by mouth for muscle spasms.    [provider]  ferrous sulfate 325 (65 FE) MG tablet Take 325 mg daily by mouth. 03/05/15   [provider]  gabapentin (NEURONTIN) 600 MG tablet TAKE 1 TABLET FOUR TIMES DAILY 03/15/16   [provider]  glipiZIDE (GLUCOTROL) 10 MG tablet TK 1 T PO ONCE D B A MEAL FOR 30 DAYS 05/11/15   [provider]  HYDROcodone-acetaminophen (NORCO) 10-325 MG tablet TK 1 T PO QID 01/17/17   [provider]  HYDROcodone-acetaminophen (NORCO/VICODIN) 5-325 MG tablet Take 1 tablet by mouth every 4 (four) hours as needed. Patient not taking: Reported on 04/19/2017 02/16/17   Rolland PorterJames, Mark, MD  hydrOXYzine (VISTARIL) 25 MG  capsule TK 1 C PO QID PRN FOR ANIXIETY 08/03/16   [provider]  lisinopril (PRINIVIL,ZESTRIL) 20 MG tablet TK 1 T PO QD 12/26/14   [provider]  methocarbamol (ROBAXIN) 500 MG tablet Take 1 tablet (500 mg total) by mouth 3 (three) times daily between meals as needed. 02/16/17   Rolland Porter, MD  Multiple Vitamins-Minerals (MULTIVITAMIN ADULTS) TABS Take 1 tablet by mouth daily.    [provider]  omeprazole (PRILOSEC) 40 MG capsule TK 1 C PO BID 01/17/17   [provider]  polyethylene glycol powder (GLYCOLAX/MIRALAX) powder Take 1  Container by mouth every other day.  12/05/13   [provider]  potassium chloride (MICRO-K) 10 MEQ CR capsule Frequency:Twice daily   Dosage:2   MEQ  Instructions:  Note:TAKE 1 CAPSULE TWICE DAILY WITH MEALS. 01/22/11   [provider]  sertraline (ZOLOFT) 100 MG tablet Take 100 mg by mouth daily.     [provider]  simvastatin (ZOCOR) 10 MG tablet Frequency:   Dosage:0   MG  Instructions:  Note:TAKE 1 TABLET BY MOUTH DAILY 10/01/10   [provider]    Family History No family history on file.  Social History Social History   Tobacco Use  . Smoking status: Not on file  Substance Use Topics  . Alcohol use: Not on file  . Drug use: Not on file     Allergies   Metformin and related; Penicillins; and Varenicline   Review of Systems Review of Systems  All other systems reviewed and are negative.    Physical Exam Updated Vital Signs BP 134/83 (BP Location: Left Arm)   Pulse 89   Temp 99 F (37.2 C) (Oral)   Resp (!) 22   SpO2 99%   Physical Exam  Constitutional: She appears well-developed and well-nourished.  Elderly female, laying in fetal position, moaning and appears uncomfortable.  HENT:  Head: Atraumatic.  Eyes: Conjunctivae are normal.  Neck: Neck supple.  Cardiovascular: Normal rate, regular rhythm and intact distal pulses.  Pulmonary/Chest: Effort normal and breath sounds normal.  Abdominal: Soft. Bowel sounds are normal. She exhibits no distension. There is no tenderness.  Genitourinary:  Genitourinary Comments: Chaperone present during exam.  Normal rectal tone.   Musculoskeletal: She exhibits tenderness (Tenderness along lumbar spine on palpation.  Well-appearing old surgical scar without signs of infection.  Increasing pain with bilateral leg raise.).  Neurological: She is alert.  Patellar deep tendon reflex intact, no foot drops bilaterally  Skin: No rash noted.  Psychiatric: She has a normal mood and affect.  Nursing  note and vitals reviewed.    ED Treatments / Results  Labs (all labs ordered are listed, but only abnormal results are displayed) Labs Reviewed  URINALYSIS, ROUTINE W REFLEX MICROSCOPIC - Abnormal; Notable for the following components:      Result Value   Color, Urine STRAW (*)    All other components within normal limits  CBC WITH DIFFERENTIAL/PLATELET - Abnormal; Notable for the following components:   RBC 3.58 (*)    Hemoglobin 9.2 (*)    HCT 31.4 (*)    MCH 25.7 (*)    MCHC 29.3 (*)    All other components within normal limits  BASIC METABOLIC PANEL    EKG None  Radiology Mr Lumbar Spine Wo Contrast  Result Date: 04/19/2018 CLINICAL DATA:  Back pain.  Prior lumbar fusion. EXAM: MRI LUMBAR SPINE WITHOUT CONTRAST TECHNIQUE: Multiplanar, multisequence MR imaging of the lumbar spine  was performed. No intravenous contrast was administered. COMPARISON:  Lumbar spine MRI 04/19/2017 FINDINGS: Segmentation: Normal. The lowest disc space is considered to be L5-S1. Alignment:  Normal Vertebrae: L4-S1 posterior instrumented spinal fusion. There is extensive susceptibility artifact from the fusion hardware. Conus medullaris and cauda equina: The conus medullaris terminates at the L1 level. The cauda equina and conus medullaris are both normal. Paraspinal and other soft tissues: The visualized aorta, IVC and iliac vessels are normal. The visualized retroperitoneal organs and paraspinal soft tissues are normal. Disc levels: Sagittal plane imaging includes the T11-12 disc level through the upper sacrum, with axial imaging of the L1-2 to L5-S1 disc levels. T11-12: Small disc bulge without spinal canal stenosis. T12-L1: Disc space narrowing with endplate spurring and small bulge. There is mild spinal canal stenosis with moderate right and severe left neural foraminal stenosis. L1-2: Disc space narrowing with mild diffuse bulge. Ligamentum flavum redundancy. There is severe spinal canal stenosis,  unchanged. Moderate right and severe left neural foraminal stenosis is also unchanged. L2-L3: Unchanged severe spinal canal stenosis due to facet and ligamentum flavum hypertrophy and diffuse disc bulge. Moderate right and mild left neural foraminal stenosis, unchanged. L3-4: Widely patent spinal canal. No neural impingement. The level is partially obscured by susceptibility artifact. L4-5: Largely obscured by susceptibility artifact. The spinal canal is widely patent centrally. L5-S1: Largely obscured by susceptibility artifact. The spinal canal and neural foramina are inadequately visualized. The visualized portion of the sacrum is normal. IMPRESSION: 1. L4-S1 posterior spinal fusion with susceptibility FX limiting assessment of the disc spaces at this level. The spinal canal appears widely patent. 2. Unchanged severe spinal canal stenosis at L1-2 and L2-3 with moderate-to-severe neural foraminal stenosis also at these levels. 3. Severe left T12-L1 neural foraminal stenosis with mild spinal canal stenosis. Electronically Signed   By: Deatra Robinson M.D.   On: 04/19/2018 19:04    Procedures Procedures (including critical care time)  Medications Ordered in ED Medications  morphine 4 MG/ML injection 6 mg (6 mg Intravenous Given 04/19/18 1601)  cyclobenzaprine (FLEXERIL) tablet 5 mg (5 mg Oral Given 04/19/18 1601)  HYDROmorphone (DILAUDID) injection 1 mg (1 mg Intravenous Given 04/19/18 1717)  morphine 4 MG/ML injection 6 mg (6 mg Intravenous Given 04/19/18 1942)     Initial Impression / Assessment and Plan / ED Course  I have reviewed the triage vital signs and the nursing notes.  Pertinent labs & imaging results that were available during my care of the patient were reviewed by me and considered in my medical decision making (see chart for details).    BP (!) 155/65   Pulse 87   Temp 99 F (37.2 C) (Oral)   Resp (!) 22   SpO2 98%    Final Clinical Impressions(s) / ED Diagnoses   Final  diagnoses:  Radicular low back pain    ED Discharge Orders         Ordered    methocarbamol (ROBAXIN) 500 MG tablet  3 times daily between meals PRN     04/19/18 1948         3:38 PM Patient here with acute on chronic low back pain which radiates down to her legs.  Patient is a patient of [facility].  I have reached out and spoke with patient's physician, Dr. Mayford Knife, who request a lumbar MRI for further evaluation of back pain.  Will provide pain management.  Given not adequate control of her pain currently, she may need  to be admitted for pain management.  7:15 PM Labs at baseline, evidence of anemia with hemoglobin of 9.2 near baseline, electro light panels are reassuring, urinalysis show no evidence of urinary tract infection, lumbar spine MRI shows L4/S1 posterior spinal fusion, unchanged severe spinal canal stenosis at L1/2 and L2/3 with moderate to severe neuroforaminal stenosis.  Severe left T12/L1 neuroforaminal still stenosis with mild spinal canal stenosis.  No documented cord compression.  At this time pain is improving but additional pain medication is beneficial.  Patient feels comfortable to return back to her facility.  Will discharge back to her facility.  Pt d/c with robaxin.     Fayrene Helper, PA-C 04/19/18 1950    Eber Hong, MD 04/19/18 2002

## 2018-06-23 ENCOUNTER — Other Ambulatory Visit: Payer: Self-pay | Admitting: Internal Medicine

## 2018-06-23 DIAGNOSIS — M40209 Unspecified kyphosis, site unspecified: Secondary | ICD-10-CM

## 2018-06-27 ENCOUNTER — Other Ambulatory Visit: Payer: Self-pay | Admitting: Nurse Practitioner

## 2018-06-27 ENCOUNTER — Ambulatory Visit
Admission: RE | Admit: 2018-06-27 | Discharge: 2018-06-27 | Disposition: A | Discharge status: Home / Self Care | Payer: Medicare (Managed Care) | Source: Ambulatory Visit | Attending: Nurse Practitioner | Admitting: Nurse Practitioner

## 2018-06-27 DIAGNOSIS — R52 Pain, unspecified: Secondary | ICD-10-CM

## 2018-07-04 ENCOUNTER — Ambulatory Visit
Admission: RE | Admit: 2018-07-04 | Discharge: 2018-07-04 | Disposition: A | Discharge status: Home / Self Care | Payer: Medicare (Managed Care) | Source: Ambulatory Visit | Attending: Internal Medicine | Admitting: Internal Medicine

## 2018-07-04 DIAGNOSIS — M40209 Unspecified kyphosis, site unspecified: Secondary | ICD-10-CM

## 2018-10-17 ENCOUNTER — Other Ambulatory Visit: Payer: Self-pay | Admitting: Internal Medicine

## 2018-10-17 ENCOUNTER — Ambulatory Visit
Admission: RE | Admit: 2018-10-17 | Discharge: 2018-10-17 | Disposition: A | Discharge status: Home / Self Care | Payer: Medicare (Managed Care) | Source: Ambulatory Visit | Attending: Internal Medicine | Admitting: Internal Medicine

## 2018-10-17 ENCOUNTER — Other Ambulatory Visit: Payer: Self-pay

## 2018-10-17 DIAGNOSIS — M25552 Pain in left hip: Secondary | ICD-10-CM

## 2018-11-16 ENCOUNTER — Encounter: Payer: Self-pay | Admitting: Internal Medicine

## 2018-11-16 NOTE — Progress Notes (Signed)
: Provider:  Noah Delaine. Alexander MD. Location:  [place] Room Number: 511-P Place of Service:  SNF (31)  PCP: [facility] [place] Patient Care Team: [facility] as PCP - General  Extended Emergency Contact Information Primary Emergency Contact: [facility] Address: [address], [place] [address] [place] Phone: [phone] Relation: Sister     Allergies: Metformin and related, Penicillins, and Varenicline  Chief Complaint  Patient presents with  . New Admit To SNF    New Admission Visit    HPI: Patient is 78 y.o. female who  Past Medical History:  Diagnosis Date  . Anemia   . Arthritis   . Depression   . Diabetes mellitus without complication ([place])   . GERD (gastroesophageal reflux disease)   . Hypertension   . Spinal stenosis     History reviewed. No pertinent surgical history.  Allergies as of 11/16/2018      Reactions   Metformin And Related    Penicillins Rash   Has patient had a PCN reaction causing immediate rash, facial/tongue/throat swelling, SOB or lightheadedness with hypotension: Yes Has patient had a PCN reaction causing severe rash involving mucus membranes or skin necrosis: No Has patient had a PCN reaction that required hospitalization: NO Has patient had a PCN reaction occurring within the last 10 years: No If all of the above answers are "NO", then may proceed with Cephalosporin use.   Varenicline Itching      Medication List       Accurate as of November 16, 2018 10:31 AM. If you have any questions, ask your nurse or doctor.        STOP taking these medications   amLODipine 10 MG tablet Commonly known as: NORVASC Stopped by: Inocencio Homes, MD   calcium-vitamin D 500-200 MG-UNIT tablet Stopped by: Inocencio Homes, MD   cyclobenzaprine 5 MG tablet Commonly known as: FLEXERIL Stopped by: Inocencio Homes, MD    ferrous sulfate 325 (65 FE) MG tablet Stopped by: Inocencio Homes, MD   HYDROcodone-acetaminophen 10-325 MG tablet Commonly known as: NORCO Stopped by: Inocencio Homes, MD   HYDROcodone-acetaminophen 5-325 MG tablet Commonly known as: NORCO/VICODIN Stopped by: Inocencio Homes, MD   hydrOXYzine 25 MG capsule Commonly known as: VISTARIL Stopped by: Inocencio Homes, MD   methocarbamol 500 MG tablet Commonly known as: ROBAXIN Stopped by: Inocencio Homes, MD   Multivitamin Adults Tabs Stopped by: Inocencio Homes, MD   omeprazole 40 MG capsule Commonly known as: PRILOSEC Stopped by: Inocencio Homes, MD   polyethylene glycol powder 17 GM/SCOOP powder Commonly known as: GLYCOLAX/MIRALAX Stopped by: Inocencio Homes, MD   potassium chloride 10 MEQ CR capsule Commonly known as: MICRO-K Stopped by: Inocencio Homes, MD   sertraline 100 MG tablet Commonly known as: ZOLOFT Stopped by: Inocencio Homes, MD     TAKE these medications   acetaminophen 500 MG tablet Commonly known as: TYLENOL Take 1,000 mg by mouth 3 (three) times daily. For Pain   baclofen 10 MG tablet Commonly known as: LIORESAL TAKE 1/2 TABLET (5MG ) BY MOUTH THREE TIMES A DAY FOR MUSCLE SPASM   bisacodyl 10 MG suppository Commonly known as: DULCOLAX If not relieved by MOM, give 10 mg Bisacodyl suppositiory rectally X 1 dose in 24 hours as needed (Do not use constipation standing orders for residents with renal failure/CFR less than 30. Contact  MD for orders) (Physician Order)   carvedilol 12.5 MG tablet Commonly known as: COREG 12.5 mg. Give 1 tablet by mouth every 12 hours for HTN   DULoxetine 60 MG capsule Commonly known as: CYMBALTA Take 60 mg by mouth daily. (DO NOT CRUSH)   ENSURE COMPLETE PO Take by mouth. Ensure Complete BID d/t poor PO Intake.   famotidine 20 MG tablet Commonly known as: PEPCID Take 20 mg by mouth 2 (two) times daily. AS NEEDED FOR HEARTBURN   gabapentin 300 MG capsule Commonly known  as: NEURONTIN Take 300 mg by mouth 3 (three) times daily. (DO NOT CRUSH) What changed: Another medication with the same name was removed. Continue taking this medication, and follow the directions you see here. Changed by: Merrilee SeashoreAnne Alexander, MD   glipiZIDE 2.5 MG 24 hr tablet Commonly known as: GLUCOTROL XL 2.5 mg. TABLET TAKE 1 TABLET BY MOUTH ONCE DAILY FOR DM What changed: Another medication with the same name was removed. Continue taking this medication, and follow the directions you see here. Changed by: Merrilee SeashoreAnne Alexander, MD   lisinopril 40 MG tablet Commonly known as: ZESTRIL Take 40 mg by mouth daily. FOR HTN What changed: Another medication with the same name was removed. Continue taking this medication, and follow the directions you see here. Changed by: Merrilee SeashoreAnne Alexander, MD   magnesium hydroxide 400 MG/5ML suspension Commonly known as: MILK OF MAGNESIA If no BM in 3 days, give 30 cc Milk of Magnesium p.o. x 1 dose in 24 hours as needed (Do not use standing constipation orders for residents with renal failure CFR less than 30. Contact MD for orders) (Physician Order)   oxyCODONE 10 mg 12 hr tablet Commonly known as: OXYCONTIN 10 mg. Give 1 tablet by mouth daily for post op cervical laminectomy   oxycodone 5 MG capsule Commonly known as: OXY-IR Give 0.5 tablets ( 2.5 mg total ) by mouth every 3 hours as needed for up to 7 days ( moderate to severe post op pain )   RA SALINE ENEMA RE If not relieved by Biscodyl suppository, give disposable Saline Enema rectally X 1 dose/24 hrs as needed (Do not use constipation standing orders for residents with renal failure/CFR less than 30. Contact MD for orders)(Physician Or   senna-docusate 8.6-50 MG tablet Commonly known as: Senokot-S 2 tablets. TAKE 2 TABLETS (17.2-100MG ) BY MOUTH AT BEDTIME FOR CONSTIPATION   simvastatin 10 MG tablet Commonly known as: ZOCOR TAKE 1 TABLET BY MOUTH AT BEDTIME FOR HYPERLIPIDEMIA       No orders of the  defined types were placed in this encounter.    There is no immunization history on file for this patient.  Social History   Tobacco Use  . Smoking status: Unknown If Ever Smoked  Substance Use Topics  . Alcohol use: Not Currently    Frequency: Never    Family history is   History reviewed. No pertinent family history.    Review of Systems  DATA OBTAINED: from patient, nurse, medical record, family member GENERAL:  no fevers, fatigue, appetite changes SKIN: No itching, or rash EYES: No eye pain, redness, discharge EARS: No earache, tinnitus, change in hearing NOSE: No congestion, drainage or bleeding  MOUTH/THROAT: No mouth or tooth pain, No sore throat RESPIRATORY: No cough, wheezing, SOB CARDIAC: No chest pain, palpitations, lower extremity edema  GI: No abdominal pain, No N/V/D or constipation, No heartburn or reflux  GU: No dysuria, frequency or urgency, or incontinence  MUSCULOSKELETAL: No unrelieved  bone/joint pain NEUROLOGIC: No headache, dizziness or focal weakness PSYCHIATRIC: No c/o anxiety or sadness   Vitals:   11/16/18 0957  BP: 134/83  Pulse: 95  Resp: 18  Temp: 97.7 F (36.5 C)    SpO2 Readings from Last 1 Encounters:  04/19/18 99%   There is no height or weight on file to calculate BMI.     Physical Exam  GENERAL APPEARANCE: Alert, conversant,  No acute distress.  SKIN: No diaphoresis rash HEAD: Normocephalic, atraumatic  EYES: Conjunctiva/lids clear. Pupils round, reactive. EOMs intact.  EARS: External exam WNL, canals clear. Hearing grossly normal.  NOSE: No deformity or discharge.  MOUTH/THROAT: Lips w/o lesions  RESPIRATORY: Breathing is even, unlabored. Lung sounds are clear   CARDIOVASCULAR: Heart RRR no murmurs, rubs or gallops. No peripheral edema.   GASTROINTESTINAL: Abdomen is soft, non-tender, not distended w/ normal bowel sounds. GENITOURINARY: Bladder non tender, not distended  MUSCULOSKELETAL: No abnormal joints or  musculature NEUROLOGIC:  Cranial nerves 2-12 grossly intact. Moves all extremities  PSYCHIATRIC: Mood and affect appropriate to situation, no behavioral issues  There are no active problems to display for this patient.     Labs reviewed: Basic Metabolic Panel:    Component Value Date/Time   NA 140 04/19/2018 1600   K 3.9 04/19/2018 1600   CL 111 04/19/2018 1600   CO2 24 04/19/2018 1600   GLUCOSE 78 04/19/2018 1600   BUN 18 04/19/2018 1600   CREATININE 0.78 04/19/2018 1600   CALCIUM 9.3 04/19/2018 1600   GFRNONAA >60 04/19/2018 1600   GFRAA >60 04/19/2018 1600    Recent Labs    04/19/18 1600  NA 140  K 3.9  CL 111  CO2 24  GLUCOSE 78  BUN 18  CREATININE 0.78  CALCIUM 9.3   Liver Function Tests: No results for input(s): AST, ALT, ALKPHOS, BILITOT, PROT, ALBUMIN in the last 8760 hours. No results for input(s): LIPASE, AMYLASE in the last 8760 hours. No results for input(s): AMMONIA in the last 8760 hours. CBC: Recent Labs    04/19/18 1600  WBC 5.5  NEUTROABS 2.0  HGB 9.2*  HCT 31.4*  MCV 87.7  PLT 162   Lipid No results for input(s): CHOL, HDL, LDLCALC, TRIG in the last 8760 hours.  Cardiac Enzymes: No results for input(s): CKTOTAL, CKMB, CKMBINDEX, TROPONINI in the last 8760 hours. BNP: No results for input(s): BNP in the last 8760 hours. No results found for: MICROALBUR No results found for: HGBA1C No results found for: TSH No results found for: VITAMINB12 No results found for: FOLATE No results found for: IRON, TIBC, FERRITIN  Imaging and Procedures obtained prior to SNF admission: Dg Hip Unilat With Pelvis 2-3 Views Left  Result Date: 10/17/2018 CLINICAL DATA:  Recent fall 2 weeks ago, persistent left hip pain, difficulty bearing weight EXAM: DG HIP (WITH OR WITHOUT PELVIS) 2-3V LEFT COMPARISON:  01/26/2018 FINDINGS: Remote lumbosacral fusion hardware noted. Bones are osteopenic. Bilateral SI joint arthropathy. Mild degenerative changes of the  left hip with joint space loss, sclerosis, and femoral head collar like osteophytes. No gross malalignment or displaced fracture. Similar mild degenerative changes of the right hip. Bony pelvis intact. Upper lumbar spondylosis also noted. IMPRESSION: Degenerative changes as above and osteopenia. No acute finding by plain radiography. Electronically Signed   By: Judie PetitM.  Shick M.D.   On: 10/17/2018 13:48     Not all labs, radiology exams or other studies done during hospitalization come through on my EPIC note; however they  are reviewed by me.    Assessment and Plan  No problem-specific Assessment & Plan notes found for this encounter.   Margit HanksAnne D Alexander, MD   This encounter was created in error - please disregard.

## 2019-03-08 ENCOUNTER — Ambulatory Visit (HOSPITAL_COMMUNITY)
Admission: RE | Admit: 2019-03-08 | Discharge: 2019-03-08 | Disposition: A | Discharge status: Home / Self Care | Payer: Medicare (Managed Care) | Source: Ambulatory Visit | Attending: Family | Admitting: Family

## 2019-03-08 ENCOUNTER — Other Ambulatory Visit (HOSPITAL_COMMUNITY): Payer: Self-pay | Admitting: Internal Medicine

## 2019-03-08 ENCOUNTER — Other Ambulatory Visit (HOSPITAL_COMMUNITY): Payer: Self-pay | Admitting: Family Medicine

## 2019-03-08 ENCOUNTER — Other Ambulatory Visit: Payer: Self-pay

## 2019-03-08 DIAGNOSIS — M7989 Other specified soft tissue disorders: Secondary | ICD-10-CM

## 2019-09-03 ENCOUNTER — Emergency Department (HOSPITAL_COMMUNITY): Payer: Medicare (Managed Care)

## 2019-09-03 ENCOUNTER — Other Ambulatory Visit: Payer: Self-pay

## 2019-09-03 ENCOUNTER — Inpatient Hospital Stay (HOSPITAL_COMMUNITY)
Admission: EM | Admit: 2019-09-03 | Discharge: 2019-09-06 | DRG: 603 | Disposition: A | Discharge status: Home Health | Payer: Medicare (Managed Care) | Attending: Internal Medicine | Admitting: Internal Medicine

## 2019-09-03 ENCOUNTER — Encounter (HOSPITAL_COMMUNITY): Payer: Self-pay | Admitting: Emergency Medicine

## 2019-09-03 DIAGNOSIS — I10 Essential (primary) hypertension: Secondary | ICD-10-CM | POA: Diagnosis present

## 2019-09-03 DIAGNOSIS — D649 Anemia, unspecified: Secondary | ICD-10-CM | POA: Diagnosis present

## 2019-09-03 DIAGNOSIS — E785 Hyperlipidemia, unspecified: Secondary | ICD-10-CM | POA: Diagnosis present

## 2019-09-03 DIAGNOSIS — Z880 Allergy status to penicillin: Secondary | ICD-10-CM

## 2019-09-03 DIAGNOSIS — Z79899 Other long term (current) drug therapy: Secondary | ICD-10-CM

## 2019-09-03 DIAGNOSIS — E1159 Type 2 diabetes mellitus with other circulatory complications: Secondary | ICD-10-CM | POA: Diagnosis present

## 2019-09-03 DIAGNOSIS — E1140 Type 2 diabetes mellitus with diabetic neuropathy, unspecified: Secondary | ICD-10-CM | POA: Diagnosis present

## 2019-09-03 DIAGNOSIS — K219 Gastro-esophageal reflux disease without esophagitis: Secondary | ICD-10-CM | POA: Diagnosis present

## 2019-09-03 DIAGNOSIS — E119 Type 2 diabetes mellitus without complications: Secondary | ICD-10-CM | POA: Diagnosis present

## 2019-09-03 DIAGNOSIS — K5900 Constipation, unspecified: Secondary | ICD-10-CM | POA: Diagnosis present

## 2019-09-03 DIAGNOSIS — F329 Major depressive disorder, single episode, unspecified: Secondary | ICD-10-CM | POA: Diagnosis present

## 2019-09-03 DIAGNOSIS — Z20822 Contact with and (suspected) exposure to covid-19: Secondary | ICD-10-CM | POA: Diagnosis present

## 2019-09-03 DIAGNOSIS — L03211 Cellulitis of face: Principal | ICD-10-CM | POA: Diagnosis present

## 2019-09-03 DIAGNOSIS — M1990 Unspecified osteoarthritis, unspecified site: Secondary | ICD-10-CM | POA: Diagnosis present

## 2019-09-03 DIAGNOSIS — M4800 Spinal stenosis, site unspecified: Secondary | ICD-10-CM | POA: Diagnosis present

## 2019-09-03 DIAGNOSIS — E1165 Type 2 diabetes mellitus with hyperglycemia: Secondary | ICD-10-CM | POA: Diagnosis present

## 2019-09-03 DIAGNOSIS — Z888 Allergy status to other drugs, medicaments and biological substances status: Secondary | ICD-10-CM | POA: Diagnosis not present

## 2019-09-03 DIAGNOSIS — I152 Hypertension secondary to endocrine disorders: Secondary | ICD-10-CM | POA: Diagnosis present

## 2019-09-03 HISTORY — DX: Type 2 diabetes mellitus without complications: E11.9

## 2019-09-03 LAB — COMPREHENSIVE METABOLIC PANEL
ALT: 13 U/L (ref 0–44)
AST: 16 U/L (ref 15–41)
Albumin: 4.3 g/dL (ref 3.5–5.0)
Alkaline Phosphatase: 44 U/L (ref 38–126)
Anion gap: 8 (ref 5–15)
BUN: 16 mg/dL (ref 8–23)
CO2: 28 mmol/L (ref 22–32)
Calcium: 10 mg/dL (ref 8.9–10.3)
Chloride: 107 mmol/L (ref 98–111)
Creatinine, Ser: 0.71 mg/dL (ref 0.44–1.00)
GFR calc Af Amer: >60 mL/min (ref >60)
GFR calc non Af Amer: >60 mL/min (ref >60)
Glucose, Bld: 238 mg/dL — ABNORMAL HIGH (ref 70–99)
Potassium: 3.6 mmol/L (ref 3.5–5.1)
Sodium: 143 mmol/L (ref 135–145)
Total Bilirubin: 0.7 mg/dL (ref 0.3–1.2)
Total Protein: 7.8 g/dL (ref 6.5–8.1)

## 2019-09-03 LAB — CBC WITH DIFFERENTIAL/PLATELET
Abs Immature Granulocytes: 0 10*3/uL (ref 0.00–0.07)
Basophils Absolute: 0 10*3/uL (ref 0.0–0.1)
Basophils Relative: 1 %
Eosinophils Absolute: 0.2 10*3/uL (ref 0.0–0.5)
Eosinophils Relative: 4 %
HCT: 36.3 % (ref 36.0–46.0)
Hemoglobin: 11.2 g/dL — ABNORMAL LOW (ref 12.0–15.0)
Immature Granulocytes: 0 %
Lymphocytes Relative: 44 %
Lymphs Abs: 2.7 10*3/uL (ref 0.7–4.0)
MCH: 27.5 pg (ref 26.0–34.0)
MCHC: 30.9 g/dL (ref 30.0–36.0)
MCV: 89 fL (ref 80.0–100.0)
Monocytes Absolute: 0.4 10*3/uL (ref 0.1–1.0)
Monocytes Relative: 7 %
Neutro Abs: 2.7 10*3/uL (ref 1.7–7.7)
Neutrophils Relative %: 44 %
Platelets: 189 10*3/uL (ref 150–400)
RBC: 4.08 MIL/uL (ref 3.87–5.11)
RDW: 14.3 % (ref 11.5–15.5)
WBC: 6.2 10*3/uL (ref 4.0–10.5)
nRBC: 0 % (ref 0.0–0.2)

## 2019-09-03 LAB — LACTIC ACID, PLASMA: Lactic Acid, Venous: 1 mmol/L (ref 0.5–1.9)

## 2019-09-03 LAB — CBG MONITORING, ED: Glucose-Capillary: 127 mg/dL — ABNORMAL HIGH (ref 70–99)

## 2019-09-03 MED ORDER — DULOXETINE HCL 30 MG PO CPEP
60.0000 mg | ORAL_CAPSULE | Freq: Every day | ORAL | Status: DC
  Administered 2019-09-04 – 2019-09-06 (×3): 60 mg via ORAL
  Filled 2019-09-03 (×3): qty 2

## 2019-09-03 MED ORDER — IOHEXOL 300 MG/ML  SOLN
75.0000 mL | Freq: Once | INTRAMUSCULAR | Status: AC | PRN
  Administered 2019-09-03: 75 mL via INTRAVENOUS

## 2019-09-03 MED ORDER — SENNOSIDES-DOCUSATE SODIUM 8.6-50 MG PO TABS
2.0000 | ORAL_TABLET | Freq: Every day | ORAL | Status: DC
  Administered 2019-09-03 – 2019-09-05 (×3): 2 via ORAL
  Filled 2019-09-03 (×3): qty 2

## 2019-09-03 MED ORDER — FAMOTIDINE 20 MG PO TABS
20.0000 mg | ORAL_TABLET | Freq: Two times a day (BID) | ORAL | Status: DC
  Administered 2019-09-04 – 2019-09-06 (×5): 20 mg via ORAL
  Filled 2019-09-03 (×5): qty 1

## 2019-09-03 MED ORDER — ACETAMINOPHEN 650 MG RE SUPP: 650.0000 mg | Freq: Four times a day (QID) | RECTAL | Status: DC | PRN

## 2019-09-03 MED ORDER — SODIUM CHLORIDE 0.9 % IV SOLN
2.0000 g | Freq: Once | INTRAVENOUS | Status: AC
  Administered 2019-09-03: 2 g via INTRAVENOUS
  Filled 2019-09-03: qty 2

## 2019-09-03 MED ORDER — SODIUM CHLORIDE (PF) 0.9 % IJ SOLN
INTRAMUSCULAR | Status: AC
  Filled 2019-09-03: qty 50

## 2019-09-03 MED ORDER — LISINOPRIL 20 MG PO TABS
40.0000 mg | ORAL_TABLET | Freq: Every day | ORAL | Status: DC
  Administered 2019-09-04 – 2019-09-06 (×3): 40 mg via ORAL
  Filled 2019-09-03 (×3): qty 2

## 2019-09-03 MED ORDER — OXYCODONE HCL ER 10 MG PO T12A
10.0000 mg | EXTENDED_RELEASE_TABLET | Freq: Two times a day (BID) | ORAL | Status: DC
  Administered 2019-09-03 – 2019-09-06 (×6): 10 mg via ORAL
  Filled 2019-09-03 (×6): qty 1

## 2019-09-03 MED ORDER — SODIUM CHLORIDE 0.9 % IV BOLUS
500.0000 mL | Freq: Once | INTRAVENOUS | Status: AC
  Administered 2019-09-03: 18:00:00 500 mL via INTRAVENOUS

## 2019-09-03 MED ORDER — SODIUM CHLORIDE 0.9 % IV SOLN
2.0000 g | Freq: Three times a day (TID) | INTRAVENOUS | Status: DC
  Administered 2019-09-04 (×2): 2 g via INTRAVENOUS
  Filled 2019-09-03 (×3): qty 2

## 2019-09-03 MED ORDER — HEPARIN SODIUM (PORCINE) 5000 UNIT/ML IJ SOLN
5000.0000 [IU] | Freq: Three times a day (TID) | INTRAMUSCULAR | Status: DC
  Administered 2019-09-03 – 2019-09-06 (×8): 5000 [IU] via SUBCUTANEOUS
  Filled 2019-09-03 (×8): qty 1

## 2019-09-03 MED ORDER — VANCOMYCIN HCL IN DEXTROSE 1-5 GM/200ML-% IV SOLN
1000.0000 mg | INTRAVENOUS | Status: DC
  Administered 2019-09-04: 1000 mg via INTRAVENOUS
  Filled 2019-09-03: qty 200

## 2019-09-03 MED ORDER — CALCIUM CARBONATE-VITAMIN D 500-200 MG-UNIT PO TABS
1.0000 | ORAL_TABLET | Freq: Every day | ORAL | Status: DC
  Administered 2019-09-03 – 2019-09-06 (×4): 1 via ORAL
  Filled 2019-09-03 (×5): qty 1

## 2019-09-03 MED ORDER — GABAPENTIN 300 MG PO CAPS
300.0000 mg | ORAL_CAPSULE | Freq: Three times a day (TID) | ORAL | Status: DC
  Administered 2019-09-03 – 2019-09-06 (×8): 300 mg via ORAL
  Filled 2019-09-03 (×8): qty 1

## 2019-09-03 MED ORDER — ONDANSETRON HCL 4 MG PO TABS: 4.0000 mg | ORAL_TABLET | Freq: Four times a day (QID) | ORAL | Status: DC | PRN

## 2019-09-03 MED ORDER — BACLOFEN 10 MG PO TABS
5.0000 mg | ORAL_TABLET | Freq: Three times a day (TID) | ORAL | Status: DC
  Administered 2019-09-03 – 2019-09-04 (×2): 5 mg via ORAL
  Filled 2019-09-03 (×2): qty 1

## 2019-09-03 MED ORDER — ONDANSETRON HCL 4 MG/2ML IJ SOLN: 4.0000 mg | Freq: Four times a day (QID) | INTRAMUSCULAR | Status: DC | PRN

## 2019-09-03 MED ORDER — CARVEDILOL 12.5 MG PO TABS
12.5000 mg | ORAL_TABLET | Freq: Two times a day (BID) | ORAL | Status: DC
  Administered 2019-09-04 – 2019-09-06 (×5): 12.5 mg via ORAL
  Filled 2019-09-03 (×6): qty 1

## 2019-09-03 MED ORDER — INSULIN ASPART 100 UNIT/ML ~~LOC~~ SOLN
0.0000 [IU] | Freq: Three times a day (TID) | SUBCUTANEOUS | Status: DC
  Administered 2019-09-05 (×2): 1 [IU] via SUBCUTANEOUS
  Administered 2019-09-06: 12:00:00 2 [IU] via SUBCUTANEOUS
  Filled 2019-09-03: qty 0.09

## 2019-09-03 MED ORDER — SIMVASTATIN 10 MG PO TABS
10.0000 mg | ORAL_TABLET | Freq: Every evening | ORAL | Status: DC
  Administered 2019-09-03 – 2019-09-05 (×3): 10 mg via ORAL
  Filled 2019-09-03 (×3): qty 1

## 2019-09-03 MED ORDER — VANCOMYCIN HCL IN DEXTROSE 1-5 GM/200ML-% IV SOLN
1000.0000 mg | Freq: Once | INTRAVENOUS | Status: AC
  Administered 2019-09-03: 1000 mg via INTRAVENOUS
  Filled 2019-09-03: qty 200

## 2019-09-03 MED ORDER — ACETAMINOPHEN 325 MG PO TABS
650.0000 mg | ORAL_TABLET | Freq: Four times a day (QID) | ORAL | Status: DC | PRN
  Administered 2019-09-06: 09:00:00 650 mg via ORAL
  Filled 2019-09-03: qty 2

## 2019-09-03 NOTE — ED Notes (Signed)
Dr Mayford Knife called to update writer, pt has Cellulitis of the face, started on Doxy Friday. The swelling is increasing, she also has spinal stenosis. She is concerned about her having to sit so long due to ED Wait time. Also if you have questions you may contact her at [phone] [facility]

## 2019-09-03 NOTE — ED Notes (Signed)
Orthostatic vitals done in error

## 2019-09-03 NOTE — Progress Notes (Signed)
Pharmacy Antibiotic Note  Sydney Gilbert is a 79 y.o. female admitted on 09/03/2019 with cellulitis.  Pharmacy has been consulted for vancomycin + aztreonam dosing.  Today, 09/03/19 WBC WNL SCr WNL, CrCl ~56 mL/min Afebrile  Plan:  Aztreonam 2 g IV q8h  Vancomycin 1000 mg IV q24h  Goal vancomycin AUC 400-550  Follow renal function, check vancomyicn levels once at steady state if indicated  Height: 5\' 7"  (170.2 cm) Weight: 148 lb (67.1 kg) IBW/kg (Calculated) : 61.6  Temp (24hrs), Avg:98.6 F (37 C), Min:98.6 F (37 C), Max:98.6 F (37 C)  Recent Labs  Lab 09/03/19 1559  WBC 6.2  CREATININE 0.71  LATICACIDVEN 1.0    Estimated Creatinine Clearance: 56.4 mL/min (by C-G formula based on SCr of 0.71 mg/dL).    Allergies  Allergen Reactions  . Metformin And Related   . Penicillins Rash    Has patient had a PCN reaction causing immediate rash, facial/tongue/throat swelling, SOB or lightheadedness with hypotension: Yes Has patient had a PCN reaction causing severe rash involving mucus membranes or skin necrosis: No Has patient had a PCN reaction that required hospitalization: NO Has patient had a PCN reaction occurring within the last 10 years: No If all of the above answers are "NO", then may proceed with Cephalosporin use.   . Varenicline Itching    09/05/19, PharmD 09/03/2019 10:18 PM

## 2019-09-03 NOTE — ED Provider Notes (Signed)
[facility]-EMERGENCY DEPT Provider Note   CSN: [id number] Arrival date & time: 09/03/19  1314     History Chief Complaint  Patient presents with  . Facial Swelling    Marvella Jenning is a 79 y.o. female.  HPI     79 year old female comes in a chief complaint of facial swelling. Patient has history of diabetes, GERD, arthritis.  Patient reports that she started noticing some rash over her nose along with discomfort about a week ago.  She saw her doctor who started her on antibiotics on Friday.  Despite taking the doxycycline her swelling has gotten worse, therefore she was sent to the ER.  Patient denies any fevers, chills.  She has no focal numbness, weakness, vision change.  She is unsure of what triggered the symptoms.  She has no history of allergies or skin disease.  Past Medical History:  Diagnosis Date  . Anemia   . Arthritis   . Depression   . Diabetes mellitus without complication (HCC)   . GERD (gastroesophageal reflux disease)   . Hypertension   . Spinal stenosis     There are no problems to display for this patient.   History reviewed. No pertinent surgical history.   OB History   No obstetric history on file.     No family history on file.  Social History   Tobacco Use  . Smoking status: Unknown If Ever Smoked  Substance Use Topics  . Alcohol use: Not Currently  . Drug use: Never    Home Medications Prior to Admission medications   Medication Sig Start Date End Date Taking? Authorizing Provider  acetaminophen (TYLENOL) 500 MG tablet Take 1,000 mg by mouth 3 (three) times daily. For Pain   Yes [provider]  baclofen (LIORESAL) 10 MG tablet Take 5 mg by mouth in the morning, at noon, and at bedtime.    Yes [provider]  calcium-vitamin D (OSCAL WITH D) 500-200 MG-UNIT tablet Take 1 tablet by mouth daily.   Yes [provider]  carvedilol (COREG) 12.5 MG tablet Take 12.5 mg by mouth 2 (two)  times daily with a meal.    Yes [provider]  DULoxetine (CYMBALTA) 60 MG capsule Take 60 mg by mouth daily. (DO NOT CRUSH)   Yes [provider]  famotidine (PEPCID) 20 MG tablet Take 20 mg by mouth 2 (two) times daily. AS NEEDED FOR HEARTBURN   Yes [provider]  gabapentin (NEURONTIN) 300 MG capsule Take 300 mg by mouth 3 (three) times daily. (DO NOT CRUSH)   Yes [provider]  glipiZIDE (GLUCOTROL XL) 2.5 MG 24 hr tablet 2.5 mg. TABLET TAKE 1 TABLET BY MOUTH ONCE DAILY FOR DM   Yes [provider]  lisinopril (ZESTRIL) 40 MG tablet Take 40 mg by mouth daily. FOR HTN   Yes [provider]  oxyCODONE (OXYCONTIN) 10 mg 12 hr tablet 10 mg. Give 1 tablet by mouth daily for post op cervical laminectomy   Yes [provider]  senna-docusate (SENOKOT-S) 8.6-50 MG tablet Take 2 tablets by mouth at bedtime.    Yes [provider]  simvastatin (ZOCOR) 10 MG tablet Take 10 mg by mouth every evening.  11/16/18  Yes [provider]    Allergies    Metformin and related, Penicillins, and Varenicline  Review of Systems   Review of Systems  Constitutional: Positive for activity change. Negative for fever.  HENT: Positive for facial swelling and sinus  pain. Negative for mouth sores, nosebleeds, postnasal drip, tinnitus, trouble swallowing and voice change.   Skin: Positive for rash.  Allergic/Immunologic: Negative for immunocompromised state.  Hematological: Does not bruise/bleed easily.  All other systems reviewed and are negative.   Physical Exam Updated Vital Signs BP (!) 151/83 (BP Location: Right Arm)   Pulse 85   Temp 98.6 F (37 C) (Oral)   Resp 16   SpO2 100%   Physical Exam Vitals and nursing note reviewed.  Constitutional:      Appearance: She is well-developed.  HENT:     Head: Normocephalic and atraumatic.  Eyes:     Extraocular Movements: Extraocular movements intact.     Pupils: Pupils are  equal, round, and reactive to light.     Comments: No Nystagmus  Cardiovascular:     Rate and Rhythm: Normal rate.  Pulmonary:     Effort: Pulmonary effort is normal.  Abdominal:     General: Bowel sounds are normal.  Musculoskeletal:     Cervical back: Normal range of motion and neck supple.  Skin:    General: Skin is warm and dry.     Comments: Pt has swelling to the right side of the midface and her entire nose. Oral mucosa and nasal mucosa appears normal color.  The mucosa is slightly edematous in her nares, but no pustules or signs of necrosis or drainage appreciated  Neurological:     Mental Status: She is alert and oriented to person, place, and time.     Cranial Nerves: No cranial nerve deficit.     Sensory: No sensory deficit.     ED Results / Procedures / Treatments   Labs (all labs ordered are listed, but only abnormal results are displayed) Labs Reviewed  COMPREHENSIVE METABOLIC PANEL - Abnormal; Notable for the following components:      Result Value   Glucose, Bld 238 (*)    All other components within normal limits  CBC WITH DIFFERENTIAL/PLATELET - Abnormal; Notable for the following components:   Hemoglobin 11.2 (*)    All other components within normal limits  SARS CORONAVIRUS 2 (TAT 6-24 HRS)  LACTIC ACID, PLASMA    EKG None  Radiology CT Maxillofacial W Contrast  Result Date: 09/03/2019 CLINICAL DATA:  Right-sided facial swelling for 1 week. EXAM: CT MAXILLOFACIAL WITH CONTRAST TECHNIQUE: Multidetector CT imaging of the maxillofacial structures was performed with intravenous contrast. Multiplanar CT image reconstructions were also generated. CONTRAST:  68mL OMNIPAQUE IOHEXOL 300 MG/ML  SOLN COMPARISON:  None. FINDINGS: Osseous: The patient is edentulous. No acute or healing fractures are present. The mandible is intact and located. Remote nasal bone fractures are evident. Skull base is unremarkable. Orbits: Left infraorbital soft tissue swelling present.  Bilateral lens replacements are present. No postseptal inflammatory changes are present. Left orbit is within normal limits. Sinuses: Fluid is present at the inferior left mastoid air cells. No obstructing nasopharyngeal lesion is present. The paranasal sinuses and mastoid air cells are otherwise clear. Soft tissues: Inflammatory changes appear centered in the lateral right upper lip. No discrete collection is present. Diffuse subcutaneous inflammatory changes are present over the right side of the face the inferior orbital rim and inferior to the mandible. No other focal etiology is evident. Muscles of mastication are within normal limits. No significant cervical adenopathy is present. Limited intracranial: Atherosclerotic calcifications are present within the cavernous internal carotid arteries and at the dural margin of the left vertebral artery. The intracranial vessels normally opacified. IMPRESSION:  1. Diffuse inflammatory changes over the right side of the face appears centered at the right upper lobe. No discrete lesion is evident. 2. Inflammatory changes extend from the right inferior orbital rim to the mandible. 3. No postseptal inflammatory changes of the right orbit. 4. Atherosclerosis. Electronically Signed   By: Marin Roberts M.D.   On: 09/03/2019 18:33    Procedures Procedures (including critical care time)  Medications Ordered in ED Medications  sodium chloride (PF) 0.9 % injection (has no administration in time range)  vancomycin (VANCOCIN) IVPB 1000 mg/200 mL premix (has no administration in time range)  sodium chloride 0.9 % bolus 500 mL (500 mLs Intravenous New Bag/Given 09/03/19 1741)  iohexol (OMNIPAQUE) 300 MG/ML solution 75 mL (75 mLs Intravenous Contrast Given 09/03/19 1809)    ED Course  I have reviewed the triage vital signs and the nursing notes.  Pertinent labs & imaging results that were available during my care of the patient were reviewed by me and considered in  my medical decision making (see chart for details).    MDM Rules/Calculators/A&P                       Patient comes in a chief complaint of facial swelling. She has history of diabetes.  The swelling is not responding to antibiotics. Likely she is having facial cellulitis.  Concerns for deep space infection -therefore CT ordered.  She is diabetic therefore she is at risk for ongoing infection as well.  Neuro exam is benign therefore we do not think she has dural venous thrombosis.  Patient does have some headaches but there is no meningismus.  7:29 PM CT scan results do not reveal any abscess.  We will admit her to the hospitalist service.  I will start IV vancomycin. Clinically patient is not septic. No signs of bony destructions or fungal infection on CT.  Final Clinical Impression(s) / ED Diagnoses Final diagnoses:  Facial cellulitis    Rx / DC Orders ED Discharge Orders    None       Derwood Kaplan, MD 09/03/19 1930

## 2019-09-03 NOTE — H&P (Signed)
History and Physical    Sydney Gilbert AVW:[id number] DOB: [birth year] DOA: 09/03/2019  PCP: [facility]  Patient coming from: Home.  Chief Complaint: Swelling of the right side of face.  HPI: Sydney Gilbert is a 79 y.o. female with history of diabetes mellitus type 2, hypertension, hyperlipidemia, depression presents to the ER with complaints of having increasing swelling of the right side of the face.  Patient symptoms started a week ago initially starting over the nose area which is gradually involving the right side of the face over the last 1 week.  Has some purulent discharge from the nose.  Denies fever chills.  Had been to her primary care physician and has been on antibiotics for the last 3 days.  Despite which the swelling has got worse.  No difficulty to open the eyes or opening the mouth or difficulty speaking or breathing.  No subjective feeling of fever chills.  ED Course: In the ER CT maxillofacial shows features concerning for inflammatory changes involving the right side of the face from the inferior margin of the right orbit to the mandible.  No discrete lesions.  Covid test is negative patient started on antibiotics admitted for further management.  No post septal infection seen in the CAT scan.  Labs show blood glucose of 238 hemoglobin 11.2.  Review of Systems: As per HPI, rest all negative.   Past Medical History:  Diagnosis Date  . Anemia   . Arthritis   . Depression   . Diabetes mellitus without complication (HCC)   . GERD (gastroesophageal reflux disease)   . Hypertension   . Spinal stenosis     History reviewed. No pertinent surgical history.   has an unknown smoking status. She has never used smokeless tobacco. She reports previous alcohol use. She reports that she does not use drugs.  Allergies  Allergen Reactions  . Metformin And Related   . Penicillins Rash    Has patient had a PCN reaction causing immediate rash,  facial/tongue/throat swelling, SOB or lightheadedness with hypotension: Yes Has patient had a PCN reaction causing severe rash involving mucus membranes or skin necrosis: No Has patient had a PCN reaction that required hospitalization: NO Has patient had a PCN reaction occurring within the last 10 years: No If all of the above answers are "NO", then may proceed with Cephalosporin use.   . Varenicline Itching    Family History  Family history unknown: Yes    Prior to Admission medications   Medication Sig Start Date End Date Taking? Authorizing Provider  acetaminophen (TYLENOL) 500 MG tablet Take 1,000 mg by mouth 3 (three) times daily. For Pain   Yes [provider]  baclofen (LIORESAL) 10 MG tablet Take 5 mg by mouth in the morning, at noon, and at bedtime.    Yes [provider]  calcium-vitamin D (OSCAL WITH D) 500-200 MG-UNIT tablet Take 1 tablet by mouth daily.   Yes [provider]  carvedilol (COREG) 12.5 MG tablet Take 12.5 mg by mouth 2 (two) times daily with a meal.    Yes [provider]  DULoxetine (CYMBALTA) 60 MG capsule Take 60 mg by mouth daily. (DO NOT CRUSH)   Yes [provider]  famotidine (PEPCID) 20 MG tablet Take 20 mg by mouth 2 (two) times daily. AS NEEDED FOR HEARTBURN   Yes [provider]  gabapentin (NEURONTIN) 300 MG capsule Take 300 mg by mouth 3 (three) times daily. (DO NOT  CRUSH)   Yes [provider]  glipiZIDE (GLUCOTROL XL) 2.5 MG 24 hr tablet 2.5 mg. TABLET TAKE 1 TABLET BY MOUTH ONCE DAILY FOR DM   Yes [provider]  lisinopril (ZESTRIL) 40 MG tablet Take 40 mg by mouth daily. FOR HTN   Yes [provider]  oxyCODONE (OXYCONTIN) 10 mg 12 hr tablet 10 mg. Give 1 tablet by mouth daily for post op cervical laminectomy   Yes [provider]  senna-docusate (SENOKOT-S) 8.6-50 MG tablet Take 2 tablets by mouth at bedtime.    Yes [provider]  simvastatin  (ZOCOR) 10 MG tablet Take 10 mg by mouth every evening.  11/16/18  Yes [provider]    Physical Exam: Constitutional: Moderately built and nourished. Vitals:   09/03/19 1325 09/03/19 1620 09/03/19 1830  BP: (!) 158/78 (!) 180/82 (!) 151/83  Pulse: 81 83 85  Resp: 18 16 16   Temp: 98.6 F (37 C)    TempSrc: Oral    SpO2: 100% 100% 100%   Eyes: Anicteric no pallor. ENMT: Right facial swelling involving the nose and inferior or bite to the mandible.  Indurated.  Mildly tender. Neck: No neck mass and no JVD appreciated. Respiratory: No rhonchi no crepitations. Cardiovascular: S1-S2 heard. Abdomen: Soft nontender bowel sounds present. Musculoskeletal: No edema. Skin: Right facial rash. Neurologic: Alert awake oriented time place and person.  Moves all extremities. Psychiatric: Appears normal per normal affect.   Labs on Admission: I have personally reviewed following labs and imaging studies  CBC: Recent Labs  Lab 09/03/19 1559  WBC 6.2  NEUTROABS 2.7  HGB 11.2*  HCT 36.3  MCV 89.0  PLT 189   Basic Metabolic Panel: Recent Labs  Lab 09/03/19 1559  NA 143  K 3.6  CL 107  CO2 28  GLUCOSE 238*  BUN 16  CREATININE 0.71  CALCIUM 10.0   GFR: CrCl cannot be calculated (Unknown ideal weight.). Liver Function Tests: Recent Labs  Lab 09/03/19 1559  AST 16  ALT 13  ALKPHOS 44  BILITOT 0.7  PROT 7.8  ALBUMIN 4.3   No results for input(s): LIPASE, AMYLASE in the last 168 hours. No results for input(s): AMMONIA in the last 168 hours. Coagulation Profile: No results for input(s): INR, PROTIME in the last 168 hours. Cardiac Enzymes: No results for input(s): CKTOTAL, CKMB, CKMBINDEX, TROPONINI in the last 168 hours. BNP (last 3 results) No results for input(s): PROBNP in the last 8760 hours. HbA1C: No results for input(s): HGBA1C in the last 72 hours. CBG: No results for input(s): GLUCAP in the last 168 hours. Lipid Profile: No results for input(s):  CHOL, HDL, LDLCALC, TRIG, CHOLHDL, LDLDIRECT in the last 72 hours. Thyroid Function Tests: No results for input(s): TSH, T4TOTAL, FREET4, T3FREE, THYROIDAB in the last 72 hours. Anemia Panel: No results for input(s): VITAMINB12, FOLATE, FERRITIN, TIBC, IRON, RETICCTPCT in the last 72 hours. Urine analysis:    Component Value Date/Time   COLORURINE STRAW (A) 04/19/2018 1512   APPEARANCEUR CLEAR 04/19/2018 1512   LABSPEC 1.009 04/19/2018 1512   PHURINE 5.0 04/19/2018 1512   GLUCOSEU NEGATIVE 04/19/2018 1512   HGBUR NEGATIVE 04/19/2018 1512   BILIRUBINUR NEGATIVE 04/19/2018 1512   KETONESUR NEGATIVE 04/19/2018 1512   PROTEINUR NEGATIVE 04/19/2018 1512   NITRITE NEGATIVE 04/19/2018 1512   LEUKOCYTESUR NEGATIVE 04/19/2018 1512   Sepsis Labs: @LABRCNTIP (procalcitonin:4,lacticidven:4) )No results found for this or any previous visit (from the past 240 hour(s)).   Radiological Exams on Admission:  CT Maxillofacial W Contrast  Result Date: 09/03/2019 CLINICAL DATA:  Right-sided facial swelling for 1 week. EXAM: CT MAXILLOFACIAL WITH CONTRAST TECHNIQUE: Multidetector CT imaging of the maxillofacial structures was performed with intravenous contrast. Multiplanar CT image reconstructions were also generated. CONTRAST:  17mL OMNIPAQUE IOHEXOL 300 MG/ML  SOLN COMPARISON:  None. FINDINGS: Osseous: The patient is edentulous. No acute or healing fractures are present. The mandible is intact and located. Remote nasal bone fractures are evident. Skull base is unremarkable. Orbits: Left infraorbital soft tissue swelling present. Bilateral lens replacements are present. No postseptal inflammatory changes are present. Left orbit is within normal limits. Sinuses: Fluid is present at the inferior left mastoid air cells. No obstructing nasopharyngeal lesion is present. The paranasal sinuses and mastoid air cells are otherwise clear. Soft tissues: Inflammatory changes appear centered in the lateral right upper lip.  No discrete collection is present. Diffuse subcutaneous inflammatory changes are present over the right side of the face the inferior orbital rim and inferior to the mandible. No other focal etiology is evident. Muscles of mastication are within normal limits. No significant cervical adenopathy is present. Limited intracranial: Atherosclerotic calcifications are present within the cavernous internal carotid arteries and at the dural margin of the left vertebral artery. The intracranial vessels normally opacified. IMPRESSION: 1. Diffuse inflammatory changes over the right side of the face appears centered at the right upper lobe. No discrete lesion is evident. 2. Inflammatory changes extend from the right inferior orbital rim to the mandible. 3. No postseptal inflammatory changes of the right orbit. 4. Atherosclerosis. Electronically Signed   By: San Morelle M.D.   On: 09/03/2019 18:33    Assessment/Plan Principal Problem:   Facial cellulitis Active Problems:   Type 2 diabetes mellitus with hyperglycemia (HCC)   Essential hypertension   HLD (hyperlipidemia)    1. Right facial cellulitis with CAT scan showing no definite lesions or abscess at this time.  We will keep patient on empiric antibiotics.  Closely monitor clinically. 2. Diabetes mellitus type 2 -patient does not recall her last hemoglobin A1c.  We will keep patient on sliding scale coverage for now.  Closely follow CBGs. 3. Hypertension on beta-blockers and lisinopril. 4. Hyperlipidemia on statins. 5. Depression on Cymbalta. 6. Anemia appears to be chronic follow CBC.  Since patient is diabetic and has significant cellulitis involving the right face which is worsening despite being on oral antibiotics will need close monitoring for any further worsening and will need inpatient status.   DVT prophylaxis: Heparin. Code Status: Full code. Family Communication: Discussed with patient. Disposition Plan: Home. Consults called:  None. Admission status: Inpatient.   Rise Patience MD [facility] Pager [phone].  If 7PM-7AM, please contact night-coverage [email or web address] Password [facility]  09/03/2019, 8:34 PM

## 2019-09-03 NOTE — ED Notes (Signed)
Sent down rainbow and 1 set of cultures

## 2019-09-03 NOTE — ED Triage Notes (Signed)
Patient reports pain and swelling to right face x1 week. Reports taking abx as prescribed by PCP without relief.

## 2019-09-04 ENCOUNTER — Encounter (HOSPITAL_COMMUNITY): Payer: Self-pay | Admitting: Internal Medicine

## 2019-09-04 DIAGNOSIS — L03211 Cellulitis of face: Principal | ICD-10-CM

## 2019-09-04 LAB — MRSA PCR SCREENING: MRSA by PCR: NEGATIVE

## 2019-09-04 LAB — BASIC METABOLIC PANEL
Anion gap: 8 (ref 5–15)
BUN: 12 mg/dL (ref 8–23)
CO2: 27 mmol/L (ref 22–32)
Calcium: 9.8 mg/dL (ref 8.9–10.3)
Chloride: 108 mmol/L (ref 98–111)
Creatinine, Ser: 0.59 mg/dL (ref 0.44–1.00)
GFR calc Af Amer: >60 mL/min (ref >60)
GFR calc non Af Amer: >60 mL/min (ref >60)
Glucose, Bld: 167 mg/dL — ABNORMAL HIGH (ref 70–99)
Potassium: 3.7 mmol/L (ref 3.5–5.1)
Sodium: 143 mmol/L (ref 135–145)

## 2019-09-04 LAB — CBC
HCT: 34.2 % — ABNORMAL LOW (ref 36.0–46.0)
Hemoglobin: 10.5 g/dL — ABNORMAL LOW (ref 12.0–15.0)
MCH: 27.1 pg (ref 26.0–34.0)
MCHC: 30.7 g/dL (ref 30.0–36.0)
MCV: 88.1 fL (ref 80.0–100.0)
Platelets: 177 10*3/uL (ref 150–400)
RBC: 3.88 MIL/uL (ref 3.87–5.11)
RDW: 14.3 % (ref 11.5–15.5)
WBC: 5.9 10*3/uL (ref 4.0–10.5)
nRBC: 0 % (ref 0.0–0.2)

## 2019-09-04 LAB — GLUCOSE, CAPILLARY
Glucose-Capillary: 118 mg/dL — ABNORMAL HIGH (ref 70–99)
Glucose-Capillary: 120 mg/dL — ABNORMAL HIGH (ref 70–99)
Glucose-Capillary: 97 mg/dL (ref 70–99)
Glucose-Capillary: 182 mg/dL — ABNORMAL HIGH (ref 70–99)

## 2019-09-04 LAB — SARS CORONAVIRUS 2 (TAT 6-24 HRS): SARS Coronavirus 2: NEGATIVE

## 2019-09-04 MED ORDER — BACLOFEN 10 MG PO TABS
5.0000 mg | ORAL_TABLET | Freq: Three times a day (TID) | ORAL | Status: DC
  Administered 2019-09-04 – 2019-09-06 (×6): 5 mg via ORAL
  Filled 2019-09-04 (×6): qty 1

## 2019-09-04 MED ORDER — SODIUM CHLORIDE 0.9 % IV SOLN
2.0000 g | INTRAVENOUS | Status: DC
  Administered 2019-09-04 – 2019-09-05 (×2): 2 g via INTRAVENOUS
  Filled 2019-09-04: qty 20
  Filled 2019-09-04 (×2): qty 2

## 2019-09-04 NOTE — Evaluation (Signed)
Physical Therapy Evaluation Patient Details Name: Sydney Gilbert MRN: [id number] DOB: [birth year] Today's Date: 09/04/2019   History of Present Illness  79 y.o. female with history of cervical surgery, diabetes mellitus type 2, hypertension, hyperlipidemia, depression presents to the ER with complaints of having increasing swelling of the right side of the face.    CT maxillofacial shows  features concerning for inflammatory changes involving the right side of the face from the inferior margin of the right orbit to the mandible.  No discrete lesions  Clinical Impression  Pt admitted with above diagnosis.  Pt amb short distance in room, requiring min assist,  May benefit from SNF however she is currently declining a rehab stay. therefore recommend HHPT/OT.   Pt currently with functional limitations due to the deficits listed below (see PT Problem List). Pt will benefit from skilled PT to increase their independence and safety with mobility to allow discharge to the venue listed below.       Follow Up Recommendations Home health PT(may benefit from SNF but currently declines)    Equipment Recommendations  None recommended by PT    Recommendations for Other Services       Precautions / Restrictions Precautions Precautions: Fall Restrictions Weight Bearing Restrictions: No      Mobility  Bed Mobility Overal bed mobility: Needs Assistance Bed Mobility: Supine to Sit     Supine to sit: Min assist;Min guard     General bed mobility comments: assist with  trunk to upright, min /guard to return to supine, incr time needed  Transfers Overall transfer level: Needs assistance   Transfers: Sit to/from Stand Sit to Stand: Min assist         General transfer comment: repeated x3 for activity tolerance. decr assist on 3rd trial, cues for hand placement  Ambulation/Gait Ambulation/Gait assistance: Min assist Gait Distance (Feet): 18 Feet(in room, pt declined further) Assistive  device: Rolling walker (2 wheeled) Gait Pattern/deviations: Step-through pattern;Decreased stride length;Trunk flexed Gait velocity: decr   General Gait Details: cues for posture, RW position, assist to balance and maneuver RW, maintains knee flexion throughout gait cycle, no buckling . pt is unsteady however without overt LOB. fatigues quickly  Stairs            Wheelchair Mobility    Modified Rankin (Stroke Patients Only)       Balance Overall balance assessment: Needs assistance Sitting-balance support: No upper extremity supported;Feet supported Sitting balance-Leahy Scale: Fair       Standing balance-Leahy Scale: Poor Standing balance comment: reliant on UEs for static stand                             Pertinent Vitals/Pain Pain Assessment: No/denies pain    Home Living Family/patient expects to be discharged to:: Private residence Living Arrangements: Other relatives(sister) Available Help at Discharge: Family Type of Home: House Home Access: Ramped entrance     Home Layout: One level Home Equipment: Environmental consultant - 2 wheels;Wheelchair - manual      Prior Function Level of Independence: Independent with assistive device(s);Needs assistance   Gait / Transfers Assistance Needed: amb with RW in the home, uses w/c longer distances  ADL's / Homemaking Assistance Needed: sister assist with ADLs prn. sister cooks/manages meals as needed        Hand Dominance        Extremity/Trunk Assessment   Upper Extremity Assessment Upper Extremity Assessment: Generalized weakness    Lower  Extremity Assessment Lower Extremity Assessment: Generalized weakness    Cervical / Trunk Assessment Cervical / Trunk Assessment: Kyphotic  Communication   Communication: No difficulties  Cognition Arousal/Alertness: Awake/alert Behavior During Therapy: WFL for tasks assessed/performed Overall Cognitive Status: Within Functional Limits for tasks assessed                                         General Comments      Exercises     Assessment/Plan    PT Assessment Patient needs continued PT services  PT Problem List Decreased strength;Decreased activity tolerance;Decreased balance;Decreased knowledge of use of DME;Decreased mobility       PT Treatment Interventions DME instruction;Therapeutic exercise;Gait training;Functional mobility training;Therapeutic activities;Patient/family education    PT Goals (Current goals can be found in the Care Plan section)  Acute Rehab PT Goals Patient Stated Goal: get stronger PT Goal Formulation: With patient Time For Goal Achievement: 09/17/19 Potential to Achieve Goals: Good    Frequency Min 3X/week   Barriers to discharge        Co-evaluation               AM-PAC PT "6 Clicks" Mobility  Outcome Measure Help needed turning from your back to your side while in a flat bed without using bedrails?: A Little Help needed moving from lying on your back to sitting on the side of a flat bed without using bedrails?: A Little Help needed moving to and from a bed to a chair (including a wheelchair)?: A Little Help needed standing up from a chair using your arms (e.g., wheelchair or bedside chair)?: A Little Help needed to walk in hospital room?: A Little Help needed climbing 3-5 steps with a railing? : A Lot 6 Click Score: 17    End of Session Equipment Utilized During Treatment: Gait belt Activity Tolerance: Patient limited by fatigue Patient left: in bed;with call bell/phone within reach;with bed alarm set   PT Visit Diagnosis: Unsteadiness on feet (R26.81);Other abnormalities of gait and mobility (R26.89)    Time: 9935-7017 PT Time Calculation (min) (ACUTE ONLY): 17 min   Charges:   PT Evaluation $PT Eval Low Complexity: 1 Low          Taylyn Brame, PT   Acute Rehab Dept [facility]): [phone]   09/04/2019   [facility] 09/04/2019, 4:06 PM

## 2019-09-04 NOTE — Progress Notes (Signed)
PROGRESS NOTE    Sydney Gilbert  INO:[id number] DOB: 18-Aug-1940 DOA: 09/03/2019 PCP: [facility]  Brief Narrative:79 y.o. female with history of diabetes mellitus type 2, hypertension, hyperlipidemia, depression presents to the ER with complaints of having increasing swelling of the right side of the face.  Patient symptoms started a week ago initially starting over the nose area which is gradually involving the right side of the face over the last 1 week.  Has some purulent discharge from the nose.  Denies fever chills.  Had been to her primary care physician and has been on antibiotics for the last 3 days.  Despite which the swelling has got worse.  No difficulty to open the eyes or opening the mouth or difficulty speaking or breathing.  No subjective feeling of fever chills.  ED Course: In the ER CT maxillofacial shows features concerning for inflammatory changes involving the right side of the face from the inferior margin of the right orbit to the mandible.  No discrete lesions.  Covid test is negative patient started on antibiotics admitted for further management.  No post septal infection seen in the CAT scan.  Labs show blood glucose of 238 hemoglobin 11.2.  Assessment & Plan:   Principal Problem:   Facial cellulitis Active Problems:   Type 2 diabetes mellitus with hyperglycemia (HCC)   Essential hypertension   HLD (hyperlipidemia)  #1 right facial cellulitis failed outpatient treatment with antibiotics.  Patient admitted with right facial pain and swelling.  CT scan showed --Diffuse inflammatory changes over the right side of the face appears centered at the right upper lobe. No discrete lesion is evident. Inflammatory changes extend from the right inferior orbital rim to the mandibe. No postseptal inflammatory changes of the right orbit On vancomycin and aztreonam. Check MRSA PCR  #2 type 2 diabetes-on oral agents continue ssi CBG (last 3)    Recent Labs    09/03/19 2210 09/04/19 0823  GLUCAP 127* 118*     #3 history of essential hypertension on lisinopril 40 mg daily and coreg  Blood pressure 174/76 prior to medications.  She is not orthostatic.  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   Weight as of this encounter: 67.1 kg.  DVT prophylaxis: Heparin  code Status: Full code Family Communication: Discussed with sister patient lives at home with sister Disposition Plan: Patient came from home Plan is to discharge her home PT consult pending Barrier to discharge admitted with facial cellulitis on IV antibiotics  Consultants:   None  Procedures: None Antimicrobials vancomycin and aztreonam  Subjective: Patient is eating breakfast in bed On a soft diet Pain better than yesterday No nausea vomiting  Objective: Vitals:   09/04/19 0400 09/04/19 0500 09/04/19 0700 09/04/19 0805  BP: (!) 136/54 (!) 152/69 (!) 170/82 (!) 174/76  Pulse: 87 80 78 68  Resp: 18 19 10 15   Temp:      TempSrc:      SpO2: 95% 97% 99% 100%  Weight:      Height:        Intake/Output Summary (Last 24 hours) at 09/04/2019 1225 Last data filed at 09/03/2019 2026 Gross per 24 hour  Intake 500 ml  Output --  Net 500 ml   Filed Weights   09/03/19 2131  Weight: 67.1 kg    Examination: Right side of the face swollen and tender Denies any difficulty swallowing or breathing General exam: Appears in mild distress due to pain and swelling Respiratory system: Clear to auscultation. Respiratory effort normal. Cardiovascular system: S1 & S2 heard, RRR. No JVD, murmurs, rubs, gallops or clicks. No pedal edema. Gastrointestinal system: Abdomen is nondistended, soft and nontender. No  organomegaly or masses felt. Normal bowel sounds heard. Central nervous system: Alert and oriented. No focal neurological deficits. Extremities: Symmetric 5 x 5 power. Skin: No rashes, lesions or ulcers Psychiatry: Judgement and insight appear normal. Mood & affect appropriate.     Data Reviewed: I have personally reviewed following labs and imaging studies  CBC: Recent Labs  Lab 09/03/19 1559 09/04/19 0451  WBC 6.2 5.9  NEUTROABS 2.7  --   HGB 11.2* 10.5*  HCT 36.3 34.2*  MCV 89.0 88.1  PLT 189 177   Basic Metabolic Panel: Recent Labs  Lab 09/03/19 1559 09/04/19 0451  NA 143 143  K 3.6 3.7  CL 107 108  CO2 28 27  GLUCOSE 238* 167*  BUN 16 12  CREATININE 0.71 0.59  CALCIUM 10.0 9.8   GFR: Estimated Creatinine Clearance: 56.4 mL/min (by C-G formula based on SCr of 0.59 mg/dL). Liver Function Tests: Recent Labs  Lab 09/03/19 1559  AST 16  ALT 13  ALKPHOS 44  BILITOT 0.7  PROT 7.8  ALBUMIN 4.3   No results for input(s): LIPASE, AMYLASE in the last 168 hours. No results for input(s): AMMONIA in the last 168 hours. Coagulation Profile: No results for input(s): INR, PROTIME in the last 168 hours. Cardiac Enzymes: No results for input(s): CKTOTAL, CKMB, CKMBINDEX, TROPONINI in the last 168 hours. BNP (last 3 results) No results for input(s): PROBNP in the last 8760 hours. HbA1C: No results for input(s): HGBA1C in the last 72 hours. CBG: Recent Labs  Lab 09/03/19 2210 09/04/19 0823  GLUCAP 127* 118*   Lipid Profile: No results for input(s): CHOL, HDL, LDLCALC, TRIG, CHOLHDL, LDLDIRECT in the last 72 hours. Thyroid Function Tests: No results for input(s): TSH, T4TOTAL, FREET4, T3FREE, THYROIDAB in the last 72 hours. Anemia Panel: No results for input(s): VITAMINB12, FOLATE, FERRITIN, TIBC, IRON, RETICCTPCT in the last 72 hours. Sepsis Labs: Recent Labs  Lab 09/03/19 1559  LATICACIDVEN 1.0    Recent Results (from the past 240 hour(s))  SARS  CORONAVIRUS 2 (TAT 6-24 HRS) Nasopharyngeal Nasopharyngeal Swab     Status: None   Collection Time: 09/03/19  6:32 PM   Specimen: Nasopharyngeal Swab  Result Value Ref Range Status   SARS Coronavirus 2 NEGATIVE NEGATIVE Final    Comment: (NOTE) SARS-CoV-2 target nucleic acids are NOT DETECTED. The SARS-CoV-2 RNA is generally detectable in upper and lower respiratory specimens during the acute phase of infection. Negative results do not preclude SARS-CoV-2 infection, do not rule out co-infections with other pathogens, and should not be used as the sole basis for treatment or other patient management decisions. Negative results must be combined with clinical observations, patient history, and epidemiological information. The expected result is Negative. Fact Sheet for Patients: HairSlick.no Fact Sheet for Healthcare Providers: quierodirigir.com This test is not yet approved or cleared by the [place] FDA and  has been authorized for detection and/or diagnosis of SARS-CoV-2 by FDA under an Emergency Use Authorization (EUA). This EUA will remain  in effect (meaning this  test can be used) for the duration of the COVID-19 declaration under Section 56 4(b)(1) of the Act, 21 U.S.C. section 360bbb-3(b)(1), unless the authorization is terminated or revoked sooner. Performed at [facility], [address] [place], [place] [address]          Radiology Studies: CT Maxillofacial W Contrast  Result Date: 09/03/2019 CLINICAL DATA:  Right-sided facial swelling for 1 week. EXAM: CT MAXILLOFACIAL WITH CONTRAST TECHNIQUE: Multidetector CT imaging of the maxillofacial structures was performed with intravenous contrast. Multiplanar CT image reconstructions were also generated. CONTRAST:  47mL OMNIPAQUE IOHEXOL 300 MG/ML  SOLN COMPARISON:  None. FINDINGS: Osseous: The patient is edentulous. No acute or healing fractures are present. The  mandible is intact and located. Remote nasal bone fractures are evident. Skull base is unremarkable. Orbits: Left infraorbital soft tissue swelling present. Bilateral lens replacements are present. No postseptal inflammatory changes are present. Left orbit is within normal limits. Sinuses: Fluid is present at the inferior left mastoid air cells. No obstructing nasopharyngeal lesion is present. The paranasal sinuses and mastoid air cells are otherwise clear. Soft tissues: Inflammatory changes appear centered in the lateral right upper lip. No discrete collection is present. Diffuse subcutaneous inflammatory changes are present over the right side of the face the inferior orbital rim and inferior to the mandible. No other focal etiology is evident. Muscles of mastication are within normal limits. No significant cervical adenopathy is present. Limited intracranial: Atherosclerotic calcifications are present within the cavernous internal carotid arteries and at the dural margin of the left vertebral artery. The intracranial vessels normally opacified. IMPRESSION: 1. Diffuse inflammatory changes over the right side of the face appears centered at the right upper lobe. No discrete lesion is evident. 2. Inflammatory changes extend from the right inferior orbital rim to the mandible. 3. No postseptal inflammatory changes of the right orbit. 4. Atherosclerosis. Electronically Signed   By: Marin Roberts M.D.   On: 09/03/2019 18:33        Scheduled Meds: . baclofen  5 mg Oral TID  . calcium-vitamin D  1 tablet Oral Daily  . carvedilol  12.5 mg Oral BID WC  . DULoxetine  60 mg Oral Daily  . famotidine  20 mg Oral BID  . gabapentin  300 mg Oral TID  . heparin  5,000 Units Subcutaneous Q8H  . insulin aspart  0-9 Units Subcutaneous TID WC  . lisinopril  40 mg Oral Daily  . oxyCODONE  10 mg Oral Q12H  . senna-docusate  2 tablet Oral QHS  . simvastatin  10 mg Oral QPM   Continuous Infusions: . aztreonam  2 g (09/04/19 0552)  . vancomycin       LOS: 1 day     Alwyn Ren, MD  09/04/2019, 12:25 PM

## 2019-09-05 LAB — GLUCOSE, CAPILLARY
Glucose-Capillary: 105 mg/dL — ABNORMAL HIGH (ref 70–99)
Glucose-Capillary: 149 mg/dL — ABNORMAL HIGH (ref 70–99)
Glucose-Capillary: 145 mg/dL — ABNORMAL HIGH (ref 70–99)
Glucose-Capillary: 235 mg/dL — ABNORMAL HIGH (ref 70–99)

## 2019-09-05 LAB — HEMOGLOBIN A1C
Hgb A1c MFr Bld: 6.5 % — ABNORMAL HIGH (ref 4.8–5.6)
Mean Plasma Glucose: 139.85 mg/dL

## 2019-09-05 MED ORDER — BISACODYL 5 MG PO TBEC
10.0000 mg | DELAYED_RELEASE_TABLET | Freq: Every day | ORAL | Status: DC
  Administered 2019-09-05 – 2019-09-06 (×2): 10 mg via ORAL
  Filled 2019-09-05 (×2): qty 2

## 2019-09-05 MED ORDER — VITAMINS A & D EX OINT
TOPICAL_OINTMENT | Freq: Three times a day (TID) | CUTANEOUS | Status: DC
  Administered 2019-09-05 – 2019-09-06 (×4): 42.5 via TOPICAL
  Filled 2019-09-05 (×6): qty 56

## 2019-09-05 NOTE — Progress Notes (Signed)
PROGRESS NOTE    Sydney Gilbert  WTU:[id number] DOB: 1941/02/09 DOA: 09/03/2019 PCP: [facility]  Brief Narrative: 79 y.o.femalewithhistory of diabetes mellitus type 2, hypertension, hyperlipidemia, depression presents to the ER with complaints of having increasing swelling of the right side of the face. Patient symptoms started a week ago initially starting over the nose area which is gradually involving the right side of the face over the last 1 week. Has some purulent discharge from the nose. Denies fever chills. Had been to her primary care physician and has been on antibiotics for the last 3 days. Despite which the swelling has got worse. No difficulty to open the eyes or opening the mouth or difficulty speaking or breathing. No subjective feeling of fever chills.  ED Course:In the ER CT maxillofacial shows features concerning for inflammatory changes involving the right side of the face from the inferior margin of the right orbit to the mandible. No discrete lesions. Covid test is negative patient started on antibiotics admitted for further management. No post septal infection seen in the CAT scan. Labs show blood glucose of 238 hemoglobin 11.2.  09/05/2019 patient continues to have severe right sided facial pain.  Unable to chew food due to pain on soft diet.  Assessment & Plan:   Principal Problem:   Facial cellulitis Active Problems:   Type 2 diabetes mellitus with hyperglycemia (HCC)   Essential hypertension   HLD (hyperlipidemia)   #1 right facial cellulitis failed outpatient treatment with antibiotics.  Patient admitted with right facial pain and swelling.   CT scan showed --Diffuse inflammatory changes over the right side of the face appears centered at the right upper lobe. No discrete lesion is evident. Inflammatory changes extend from the right inferior orbital rim to the mandibe. No postseptal inflammatory changes of the right  orbit Continue rocephin for atleast another 24 hrs  MRSA PCR negative DC vancomycin.  #2 type 2 diabetes-on oral agents continue ssi CBG (last 3)  Recent Labs    09/04/19 2131 09/05/19 0807 09/05/19 1144  GLUCAP 182* 105* 149*     #3 history of essential hypertension blood pressure 121/53 on lisinopril 40 mg daily and coreg .  Monitor closely may need to decrease the dose of antihypertensives.   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   Estimated body mass index is 23.18 kg/m as calculated from the following:   Height as of this encounter: 5\' 7"  (1.702 m).   Weight as of this encounter: 67.1 kg.  DVT prophylaxis: Heparin  code Status: Full code Family Communication: Discussed with sister patient lives at home with sister Disposition Plan: Patient came from home Plan is to discharge her home with home health PT. Barrier to discharge admitted with facial cellulitis on IV antibiotics, needs IV antibiotics for at least another 24 hours patient still with severe acute pain to the right side of the face.  Consultants:   None  Procedures: None Antimicrobials vancomycin and aztreonam   Subjective: Patient resting in bed complaining of severe right facial pain unable to chew food trying to eat a soft diet  Objective: Vitals:   09/04/19 1253 09/04/19 1711 09/04/19 2132 09/05/19 0447  BP: (!) 154/64 (!) 163/68 (!) 129/49 (!) 121/53  Pulse: 70 72 72   Resp: 16  17 18  Temp: 98.1 F (36.7 C) 97.7 F (36.5 C) 98.3 F (36.8 C) 97.9 F (36.6 C)  TempSrc: Oral Oral Oral Oral  SpO2: 99% 100% 98% 97%  Weight:      Height:        Intake/Output Summary (Last 24 hours) at 09/05/2019 1244 Last data filed at 09/05/2019 1222 Gross per 24 hour  Intake 664 ml  Output --  Net 664 ml   Filed Weights    09/03/19 2131  Weight: 67.1 kg    Examination: Right facial tenderness erythema edema present skin extremely dry  General exam: Appears calm and comfortable  Respiratory system: Clear to auscultation. Respiratory effort normal. Cardiovascular system: S1 & S2 heard, RRR. No JVD, murmurs, rubs, gallops or clicks. No pedal edema. Gastrointestinal system: Abdomen is nondistended, soft and nontender. No organomegaly or masses felt. Normal bowel sounds heard. Central nervous system: Alert and oriented. No focal neurological deficits. Extremities: Symmetric 5 x 5 power. Skin: No rashes, lesions or ulcers Psychiatry: Judgement and insight appear normal. Mood & affect appropriate.     Data Reviewed: I have personally reviewed following labs and imaging studies  CBC: Recent Labs  Lab 09/03/19 1559 09/04/19 0451  WBC 6.2 5.9  NEUTROABS 2.7  --   HGB 11.2* 10.5*  HCT 36.3 34.2*  MCV 89.0 88.1  PLT 189 643   Basic Metabolic Panel: Recent Labs  Lab 09/03/19 1559 09/04/19 0451  NA 143 143  K 3.6 3.7  CL 107 108  CO2 28 27  GLUCOSE 238* 167*  BUN 16 12  CREATININE 0.71 0.59  CALCIUM 10.0 9.8   GFR: Estimated Creatinine Clearance: 56.4 mL/min (by C-G formula based on SCr of 0.59 mg/dL). Liver Function Tests: Recent Labs  Lab 09/03/19 1559  AST 16  ALT 13  ALKPHOS 44  BILITOT 0.7  PROT 7.8  ALBUMIN 4.3   No results for input(s): LIPASE, AMYLASE in the last 168 hours. No results for input(s): AMMONIA in the last 168 hours. Coagulation Profile: No results for input(s): INR, PROTIME in the last 168 hours. Cardiac Enzymes: No results for input(s): CKTOTAL, CKMB, CKMBINDEX, TROPONINI in the last 168 hours. BNP (last 3 results) No results for input(s): PROBNP in the last 8760 hours. HbA1C: Recent Labs    09/05/19 0519  HGBA1C 6.5*   CBG: Recent Labs  Lab 09/04/19 1310 09/04/19 1712 09/04/19 2131 09/05/19 0807 09/05/19 1144  GLUCAP 120* 97 182* 105* 149*    Lipid Profile: No results for input(s): CHOL, HDL, LDLCALC, TRIG, CHOLHDL, LDLDIRECT in the last 72 hours. Thyroid Function Tests: No results for input(s): TSH, T4TOTAL, FREET4, T3FREE, THYROIDAB in the last 72 hours. Anemia Panel: No results for input(s): VITAMINB12, FOLATE, FERRITIN, TIBC, IRON, RETICCTPCT in the last 72 hours. Sepsis Labs: Recent Labs  Lab 09/03/19 1559  LATICACIDVEN 1.0    Recent Results (from the past 240 hour(s))  SARS CORONAVIRUS 2 (TAT 6-24 HRS) Nasopharyngeal Nasopharyngeal Swab     Status: None   Collection Time: 09/03/19  6:32 PM   Specimen: Nasopharyngeal Swab  Result Value Ref Range Status   SARS Coronavirus 2 NEGATIVE NEGATIVE Final    Comment: (NOTE) SARS-CoV-2 target nucleic acids are NOT DETECTED. The SARS-CoV-2 RNA is generally detectable in upper and lower respiratory specimens during the acute phase of infection. Negative results do not preclude SARS-CoV-2 infection, do not rule out co-infections with other pathogens, and should not be used as the sole basis for treatment or other patient management decisions.  Negative results must be combined with clinical observations, patient history, and epidemiological information. The expected result is Negative. Fact Sheet for Patients: HairSlick.no Fact Sheet for Healthcare Providers: quierodirigir.com This test is not yet approved or cleared by the [place] FDA and  has been authorized for detection and/or diagnosis of SARS-CoV-2 by FDA under an Emergency Use Authorization (EUA). This EUA will remain  in effect (meaning this test can be used) for the duration of the COVID-19 declaration under Section 56 4(b)(1) of the Act, 21 U.S.C. section 360bbb-3(b)(1), unless the authorization is terminated or revoked sooner. Performed at [facility], [address], [place], [place] [address]   MRSA PCR Screening     Status: None    Collection Time: 09/04/19  4:53 PM   Specimen: Nasopharyngeal  Result Value Ref Range Status   MRSA by PCR NEGATIVE NEGATIVE Final    Comment:        The GeneXpert MRSA Assay (FDA approved for NASAL specimens only), is one component of a comprehensive MRSA colonization surveillance program. It is not intended to diagnose MRSA infection nor to guide or monitor treatment for MRSA infections. Performed at [facility], [address] [place], [place] [address]          Radiology Studies: CT Maxillofacial W Contrast  Result Date: 09/03/2019 CLINICAL DATA:  Right-sided facial swelling for 1 week. EXAM: CT MAXILLOFACIAL WITH CONTRAST TECHNIQUE: Multidetector CT imaging of the maxillofacial structures was performed with intravenous contrast. Multiplanar CT image reconstructions were also generated. CONTRAST:  65mL OMNIPAQUE IOHEXOL 300 MG/ML  SOLN COMPARISON:  None. FINDINGS: Osseous: The patient is edentulous. No acute or healing fractures are present. The mandible is intact and located. Remote nasal bone fractures are evident. Skull base is unremarkable. Orbits: Left infraorbital soft tissue swelling present. Bilateral lens replacements are present. No postseptal inflammatory changes are present. Left orbit is within normal limits. Sinuses: Fluid is present at the inferior left mastoid air cells. No obstructing nasopharyngeal lesion is present. The paranasal sinuses and mastoid air cells are otherwise clear. Soft tissues: Inflammatory changes appear centered in the lateral right upper lip. No discrete collection is present. Diffuse subcutaneous inflammatory changes are present over the right side of the face the inferior orbital rim and inferior to the mandible. No other focal etiology is evident. Muscles of mastication are within normal limits. No significant cervical adenopathy is present. Limited intracranial: Atherosclerotic calcifications are present within the cavernous  internal carotid arteries and at the dural margin of the left vertebral artery. The intracranial vessels normally opacified. IMPRESSION: 1. Diffuse inflammatory changes over the right side of the face appears centered at the right upper lobe. No discrete lesion is evident. 2. Inflammatory changes extend from the right inferior orbital rim to the mandible. 3. No postseptal inflammatory changes of the right orbit. 4. Atherosclerosis. Electronically Signed   By: Marin Roberts M.D.   On: 09/03/2019 18:33        Scheduled Meds: . baclofen  5 mg Oral TID  . calcium-vitamin D  1 tablet Oral Daily  . carvedilol  12.5 mg Oral BID WC  . DULoxetine  60 mg Oral Daily  . famotidine  20 mg Oral BID  . gabapentin  300 mg Oral TID  . heparin  5,000 Units Subcutaneous Q8H  . insulin aspart  0-9 Units Subcutaneous TID WC  . lisinopril  40 mg Oral Daily  . oxyCODONE  10 mg Oral Q12H  . senna-docusate  2 tablet Oral QHS  . simvastatin  10 mg Oral QPM  . vitamin A & D   Topical TID   Continuous Infusions: . cefTRIAXone (ROCEPHIN)  IV 2 g (09/04/19 2141)  . vancomycin 1,000 mg (09/04/19 1745)     LOS: 2 days     Alwyn Ren, MD 09/05/2019, 12:44 PM

## 2019-09-05 NOTE — Progress Notes (Signed)
Physical Therapy Treatment Patient Details Name: Sydney Gilbert MRN: [id number] DOB: [birth year] Today's Date: 09/05/2019    History of Present Illness 79 y.o. female with history of cervical surgery, diabetes mellitus type 2, hypertension, hyperlipidemia, depression presents to the ER with complaints of having increasing swelling of the right side of the face.    CT maxillofacial shows  features concerning for inflammatory changes involving the right side of the face from the inferior margin of the right orbit to the mandible.  No discrete lesions    PT Comments    Pt progressing toward PT goals. incr gait distance/tolerance today however continues to require min to min/guard  assist with balance and d/t  intermittent buckling R knee.  Pt states she knows she probably needs rehab but wants to go back home to her sister. Pt Is pleasant and motivated, continue to recommend  HHPT   Follow Up Recommendations  Home health PT(declines SNF )     Equipment Recommendations  None recommended by PT    Recommendations for Other Services       Precautions / Restrictions Precautions Precautions: Fall Restrictions Weight Bearing Restrictions: No    Mobility  Bed Mobility Overal bed mobility: Needs Assistance Bed Mobility: Supine to Sit;Sit to Supine     Supine to sit: Supervision Sit to supine: Supervision   General bed mobility comments: for safety, incr time   Transfers Overall transfer level: Needs assistance Equipment used: Rolling walker (2 wheeled) Transfers: Sit to/from Stand Sit to Stand: Min guard         General transfer comment: cues for hand placement, min/guard to rise and safely transition to RW   Ambulation/Gait Ambulation/Gait assistance: Min assist Gait Distance (Feet): 55 Feet Assistive device: Rolling walker (2 wheeled) Gait Pattern/deviations: Step-through pattern;Decreased stride length;Trunk flexed Gait velocity: decr   General Gait Details: cues for  posture, RW position, assist to balance and maneuver RW, maintains knee flexion throughout giat cycle, some slight intermittent buckling on R. cues fo rincr knee extension and use of RW/UE support    Stairs             Wheelchair Mobility    Modified Rankin (Stroke Patients Only)       Balance     Sitting balance-Leahy Scale: Fair       Standing balance-Leahy Scale: Poor Standing balance comment: reliant on UEs for static stand                            Cognition Arousal/Alertness: Awake/alert Behavior During Therapy: WFL for tasks assessed/performed Overall Cognitive Status: Within Functional Limits for tasks assessed                                        Exercises General Exercises - Lower Extremity Ankle Circles/Pumps: AROM;Both;10 reps Heel Slides: AROM;Both;10 reps;Limitations Heel Slides Limitations: right knee pain with flexion > 65 degrees  Hip ABduction/ADduction: AROM;Both;10 reps;5 reps;Limitations Hip Abduction/Adduction Limitations: limited ROM LLE d/t hip pain Straight Leg Raises: AROM;AAROM;Both;10 reps    General Comments        Pertinent Vitals/Pain Pain Assessment: No/denies pain    Home Living                      Prior Function            PT Goals (  current goals can now be found in the care plan section) Acute Rehab PT Goals Patient Stated Goal: get stronger PT Goal Formulation: With patient Time For Goal Achievement: 09/17/19 Potential to Achieve Goals: Good Progress towards PT goals: Progressing toward goals    Frequency    Min 3X/week      PT Plan Current plan remains appropriate    Co-evaluation              AM-PAC PT "6 Clicks" Mobility   Outcome Measure  Help needed turning from your back to your side while in a flat bed without using bedrails?: A Little Help needed moving from lying on your back to sitting on the side of a flat bed without using bedrails?: A  Little Help needed moving to and from a bed to a chair (including a wheelchair)?: A Little Help needed standing up from a chair using your arms (e.g., wheelchair or bedside chair)?: A Little Help needed to walk in hospital room?: A Little Help needed climbing 3-5 steps with a railing? : A Lot 6 Click Score: 17    End of Session Equipment Utilized During Treatment: Gait belt Activity Tolerance: Patient tolerated treatment well Patient left: in bed;with call bell/phone within reach;with bed alarm set   PT Visit Diagnosis: Unsteadiness on feet (R26.81);Other abnormalities of gait and mobility (R26.89)     Time: 1914-7829 PT Time Calculation (min) (ACUTE ONLY): 20 min  Charges:  $Gait Training: 8-22 mins                     Delice Bison, PT   Acute Rehab Dept [facility]): [phone]   09/05/2019    [facility] 09/05/2019, 3:12 PM

## 2019-09-05 NOTE — TOC Initial Note (Addendum)
Transition of Care [facility]) - Initial/Assessment Note    Patient Details  Name: Sydney Gilbert MRN: [id number] Date of Birth: 09/08/1940  Transition of Care [facility] [place] [facility]) CM/SW Contact:    Sydney Rogue, LCSW Phone Number: 09/05/2019, 1:58 PM  Clinical Narrative:   Sydney Gilbert is here for facial cellulitis, was seen based on PT recommendation of SNF/ Sydney Gilbert PT.  Sydney Gilbert lives with her elder sister, and feels obligated to be at home for her "because sometimes she falls, and if I can't help her get up I call our brother, and he comes to help out."  She acknowledges she could benefit from rehab, but declines that as an option.  She cites having DME at home of RW and shower chair.  She says that her bedroom is not big enough to house a 3 in 1; someone from [facility] already checked.  I called social worker Sydney Gilbert at [employer] to coordinate services-left message for her. TOC will continue to follow during the course of hospitalization.  Addendum:  Spoke with Sydney Gilbert from [facility] at [phone].  She states they provide outpatient PT and would not need an order.  They will also assess for any other needed services in the home, and notes that patient was receiving more services, but declined within the last year due to fear of COVID.  Finally, they will provide transportation home when patient is discharged.              Expected Discharge Plan: Home w Home Health Services Barriers to Discharge: No Barriers Identified   Patient Goals and CMS Choice Patient states their goals for this hospitalization and ongoing recovery are:: "I need help with pain control."      Expected Discharge Plan and Services Expected Discharge Plan: Home w Home Health Services   Discharge Planning Services: CM Consult Post Acute Care Choice: Home Health Living arrangements for the past 2 months: Apartment                                      Prior Living Arrangements/Services Living arrangements for the past 2 months: Apartment Lives  with:: Siblings Patient language and need for interpreter reviewed:: Yes Do you feel safe going back to the place where you live?: Yes      Need for Family Participation in Patient Care: Yes (Comment) Care giver support system in place?: Yes (comment) Current home services: DME Criminal Activity/Legal Involvement Pertinent to Current Situation/Hospitalization: No - Comment as needed  Activities of Daily Living Home Assistive Devices/Equipment: Wheelchair ADL Screening (condition at time of admission) Patient's cognitive ability adequate to safely complete daily activities?: Yes Is the patient deaf or have difficulty hearing?: No Does the patient have difficulty seeing, even when wearing glasses/contacts?: No Does the patient have difficulty concentrating, remembering, or making decisions?: No Patient able to express need for assistance with ADLs?: No Does the patient have difficulty dressing or bathing?: No Independently performs ADLs?: Yes (appropriate for developmental age) Does the patient have difficulty walking or climbing stairs?: Yes Weakness of Legs: Both Weakness of Arms/Hands: None  Permission Sought/Granted Permission sought to share information with : Other (comment) Permission granted to share information with : Yes, Verbal Permission Granted  Share Information with NAME: Sydney Gilbert at [facility]           Emotional Assessment Appearance:: Appears stated age Attitude/Demeanor/Rapport: Engaged Affect (typically observed): Appropriate Orientation: :  Oriented to Self, Oriented to Place, Oriented to  Time, Oriented to Situation Alcohol / Substance Use: Not Applicable Psych Involvement: No (comment)  Admission diagnosis:  Facial cellulitis [L03.211] Patient Active Problem List   Diagnosis Date Noted  . Facial cellulitis 09/03/2019  . Type 2 diabetes mellitus with hyperglycemia ([place]) 09/03/2019  . Essential hypertension 09/03/2019  . HLD (hyperlipidemia)  09/03/2019   PCP:  Inc, [place]:   [id number] - [place], [place] [address]  [address] [place] [address] Phone: [phone] Fax: [id number] - [place], [place] [address] Phone: [phone] Fax: [phone]     Social Determinants of Health (SDOH) Interventions    Readmission Risk Interventions No flowsheet data found.

## 2019-09-06 LAB — GLUCOSE, CAPILLARY
Glucose-Capillary: 119 mg/dL — ABNORMAL HIGH (ref 70–99)
Glucose-Capillary: 151 mg/dL — ABNORMAL HIGH (ref 70–99)

## 2019-09-06 MED ORDER — VITAMINS A & D EX OINT: TOPICAL_OINTMENT | Freq: Three times a day (TID) | CUTANEOUS | 0 refills | Status: DC

## 2019-09-06 MED ORDER — CEPHALEXIN 500 MG PO CAPS: 500.0000 mg | ORAL_CAPSULE | Freq: Four times a day (QID) | ORAL | 0 refills | Status: AC

## 2019-09-06 NOTE — TOC Transition Note (Signed)
Transition of Care [facility]) - CM/SW Discharge Note   Patient Details  Name: Sydney Gilbert MRN: [id number] Date of Birth: Sep 05, 1940  Transition of Care [facility]) CM/SW Contact:  Ida Rogue, LCSW Phone Number: 09/06/2019, 9:20 AM   Clinical Narrative:   Patient to d/c home today.  Contacted Marylu Lund with [facility] [phone] to ask for early PM transportation.  Gave her Maralyn Sago RN's number for coordination purposes.  Confirmed that she has her wheelchair here.  No further needs identified.  TOC sign off.    Final next level of care: Home w Home Health Services Barriers to Discharge: No Barriers Identified   Patient Goals and CMS Choice Patient states their goals for this hospitalization and ongoing recovery are:: "I need help with pain control."      Discharge Placement                       Discharge Plan and Services   Discharge Planning Services: CM Consult Post Acute Care Choice: Home Health                               Social Determinants of Health (SDOH) Interventions     Readmission Risk Interventions No flowsheet data found.

## 2019-09-06 NOTE — Discharge Summary (Signed)
Physician Discharge Summary  Sydney Gilbert ZOX:[id number] DOB: 09/15/1940 DOA: 09/03/2019  [facility]  Admit date: 09/03/2019 Discharge date: 09/06/2019  Admitted From:home Disposition:  home  Recommendations for Outpatient Follow-up:  1. Follow up with PCP in 1-2 weeks 2. Please obtain BMP/CBC in one week   Home Health:pt Equipment/Devicesnone  Discharge Condition:stable CODE STATUS:full Diet recommendation: cardiac  Brief/Interim Summary: 79 y.o.femalewithhistory of diabetes mellitus type 2, hypertension, hyperlipidemia, depression presents to the ER with complaints of having increasing swelling of the right side of the face. Patient symptoms started a week ago initially starting over the nose area which is gradually involving the right side of the face over the last 1 week. Has some purulent discharge from the nose. Denies fever chills. Had been to her primary care physician and has been on antibiotics for the last 3 days. Despite which the swelling has got worse. No difficulty to open the eyes or opening the mouth or difficulty speaking or breathing. No subjective feeling of fever chills.  ED Course:In the ER CT maxillofacial shows features concerning for inflammatory changes involving the right side of the face from the inferior margin of the right orbit to the mandible. No discrete lesions. Covid test is negative patient started on antibiotics admitted for further management. No post septal infection seen in the CAT scan. Labs show blood glucose of 238 hemoglobin 11.2.   Discharge Diagnoses:  Principal Problem:   Facial cellulitis Active Problems:   Type 2 diabetes mellitus with hyperglycemia (HCC)   Essential hypertension   HLD (hyperlipidemia)   #1 right facial cellulitis failed outpatient treatment with antibiotics. Patient admitted with right facial pain and swelling.  CT scan showed --Diffuse inflammatory changes  over the right side of the face appears centered at the right upper lobe. No discrete lesion is evident. Inflammatory changes extend from the right inferior orbital rim to the mandibe. No postseptal inflammatory changes of the right orbit She was treated with Rocephin during the hospital stay and being discharged on Keflex for 7 more days.  MRSA PCR was negative.  #2 type 2 diabetes-continue oral agents CBG (last 3)  Recent Labs (last 2 labs)        Recent Labs    09/04/19 2131 09/05/19 0807 09/05/19 1144  GLUCAP 182* 105* 149*       #3 history of essential hypertension-continue lisinopril and Coreg.  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    Estimated body mass index is 23.18 kg/m as calculated from the following:   Height as of this encounter: 5\' 7"  (1.702 m).   Weight as of this encounter: 67.1 kg.  Discharge Instructions  Discharge Instructions    Diet - low sodium heart healthy   Complete by: As directed    Diet - low sodium heart healthy   Complete by: As directed    Increase activity slowly   Complete by: As directed    Increase activity slowly   Complete by: As directed      Allergies as of 09/06/2019      Reactions   Metformin And Related    Penicillins Rash   Has patient had a PCN reaction causing immediate rash, facial/tongue/throat swelling, SOB or lightheadedness with hypotension: Yes Has patient had a PCN reaction causing severe rash involving mucus membranes or  skin necrosis: No Has patient had a PCN reaction that required hospitalization: NO Has patient had a PCN reaction occurring within the last 10 years: No If all of the above answers are "NO", then may proceed with Cephalosporin use.   Varenicline Itching      Medication List    TAKE these medications    acetaminophen 500 MG tablet Commonly known as: TYLENOL Take 1,000 mg by mouth 3 (three) times daily. For Pain   baclofen 10 MG tablet Commonly known as: LIORESAL Take 5 mg by mouth in the morning, at noon, and at bedtime.   calcium-vitamin D 500-200 MG-UNIT tablet Commonly known as: OSCAL WITH D Take 1 tablet by mouth daily.   carvedilol 12.5 MG tablet Commonly known as: COREG Take 12.5 mg by mouth 2 (two) times daily with a meal.   cephALEXin 500 MG capsule Commonly known as: KEFLEX Take 1 capsule (500 mg total) by mouth 4 (four) times daily for 7 days.   DULoxetine 60 MG capsule Commonly known as: CYMBALTA Take 60 mg by mouth daily. (DO NOT CRUSH)   famotidine 20 MG tablet Commonly known as: PEPCID Take 20 mg by mouth 2 (two) times daily. AS NEEDED FOR HEARTBURN   gabapentin 300 MG capsule Commonly known as: NEURONTIN Take 300 mg by mouth 3 (three) times daily. (DO NOT CRUSH)   glipiZIDE 2.5 MG 24 hr tablet Commonly known as: GLUCOTROL XL 2.5 mg. TABLET TAKE 1 TABLET BY MOUTH ONCE DAILY FOR DM   lisinopril 40 MG tablet Commonly known as: ZESTRIL Take 40 mg by mouth daily. FOR HTN   oxyCODONE 10 mg 12 hr tablet Commonly known as: OXYCONTIN 10 mg. Give 1 tablet by mouth daily for post op cervical laminectomy   senna-docusate 8.6-50 MG tablet Commonly known as: Senokot-S Take 2 tablets by mouth at bedtime.   simvastatin 10 MG tablet Commonly known as: ZOCOR Take 10 mg by mouth every evening.   vitamin A & D ointment Apply topically 3 (three) times daily.      Follow-up [facility] [employer] [facility], [facility] Follow up.   Contact information: [address] [place] [address] [phone]          Allergies  Allergen Reactions  . Metformin And Related   . Penicillins Rash    Has patient had a PCN reaction causing immediate rash, facial/tongue/throat swelling, SOB or lightheadedness with hypotension: Yes Has patient had  a PCN reaction causing severe rash involving mucus membranes or skin necrosis: No Has patient had a PCN reaction that required hospitalization: NO Has patient had a PCN reaction occurring within the last 10 years: No If all of the above answers are "NO", then may proceed with Cephalosporin use.   . Varenicline Itching    Consultations: None  Procedures/Studies: CT Maxillofacial W Contrast  Result Date: 09/03/2019 CLINICAL DATA:  Right-sided facial swelling for 1 week. EXAM: CT MAXILLOFACIAL WITH CONTRAST TECHNIQUE: Multidetector CT imaging of the maxillofacial structures was performed with intravenous contrast. Multiplanar CT image reconstructions were also generated. CONTRAST:  44mL OMNIPAQUE IOHEXOL 300 MG/ML  SOLN COMPARISON:  None. FINDINGS: Osseous: The patient is edentulous. No acute or healing fractures are present. The mandible is intact and located. Remote nasal bone fractures are evident. Skull base is unremarkable. Orbits: Left infraorbital soft tissue swelling present. Bilateral lens replacements are present. No postseptal inflammatory changes are present. Left orbit is within normal limits. Sinuses: Fluid is present at the  inferior left mastoid air cells. No obstructing nasopharyngeal lesion is present. The paranasal sinuses and mastoid air cells are otherwise clear. Soft tissues: Inflammatory changes appear centered in the lateral right upper lip. No discrete collection is present. Diffuse subcutaneous inflammatory changes are present over the right side of the face the inferior orbital rim and inferior to the mandible. No other focal etiology is evident. Muscles of mastication are within normal limits. No significant cervical adenopathy is present. Limited intracranial: Atherosclerotic calcifications are present within the cavernous internal carotid arteries and at the dural margin of the left vertebral artery. The intracranial vessels normally opacified. IMPRESSION: 1. Diffuse  inflammatory changes over the right side of the face appears centered at the right upper lobe. No discrete lesion is evident. 2. Inflammatory changes extend from the right inferior orbital rim to the mandible. 3. No postseptal inflammatory changes of the right orbit. 4. Atherosclerosis. Electronically Signed   By: Marin Roberts M.D.   On: 09/03/2019 18:33    (Echo, Carotid, EGD, Colonoscopy, ERCP)    Subjective:  She is resting in bed feels better able to swallow and chew better no new complaints anxious to go home Discharge Exam: Vitals:   09/05/19 2036 09/06/19 0537  BP: (!) 127/57 137/70  Pulse: 95 95  Resp: 19 19  Temp: 100.1 F (37.8 C) 99.3 F (37.4 C)  SpO2: 95% 97%   Vitals:   09/05/19 0447 09/05/19 1510 09/05/19 2036 09/06/19 0537  BP: (!) 121/53 138/66 (!) 127/57 137/70  Pulse:  81 95 95  Resp: 18 18 19 19   Temp: 97.9 F (36.6 C) 99.3 F (37.4 C) 100.1 F (37.8 C) 99.3 F (37.4 C)  TempSrc: Oral Oral Oral Oral  SpO2: 97% 98% 95% 97%  Weight:      Height:        General: Pt is alert, awake, not in acute distress, decrease edema erythema and tenderness to the right side of the face Cardiovascular: RRR, S1/S2 +, no rubs, no gallops Respiratory: CTA bilaterally, no wheezing, no rhonchi Abdominal: Soft, NT, ND, bowel sounds + Extremities: no edema, no cyanosis    The results of significant diagnostics from this hospitalization (including imaging, microbiology, ancillary and laboratory) are listed below for reference.     Microbiology: Recent Results (from the past 240 hour(s))  SARS CORONAVIRUS 2 (TAT 6-24 HRS) Nasopharyngeal Nasopharyngeal Swab     Status: None   Collection Time: 09/03/19  6:32 PM   Specimen: Nasopharyngeal Swab  Result Value Ref Range Status   SARS Coronavirus 2 NEGATIVE NEGATIVE Final    Comment: (NOTE) SARS-CoV-2 target nucleic acids are NOT DETECTED. The SARS-CoV-2 RNA is generally detectable in upper and lower respiratory  specimens during the acute phase of infection. Negative results do not preclude SARS-CoV-2 infection, do not rule out co-infections with other pathogens, and should not be used as the sole basis for treatment or other patient management decisions. Negative results must be combined with clinical observations, patient history, and epidemiological information. The expected result is Negative. Fact Sheet for Patients: 09/05/19 Fact Sheet for Healthcare Providers: HairSlick.no This test is not yet approved or cleared by the [employer]ir[employer]ir.com FDA and  has been authorized for detection and/or diagnosis of SARS-CoV-2 by FDA under an Emergency Use Authorization (EUA). This EUA will remain  in effect (meaning this test can be used) for the duration of the COVID-19 declaration under Section 56 4(b)(1) of the Act, 21 U.S.C. section 360bbb-3(b)(1), unless the authorization is terminated  or revoked sooner. Performed at [facility], [address], [place], [place] [address]   MRSA PCR Screening     Status: None   Collection Time: 09/04/19  4:53 PM   Specimen: Nasopharyngeal  Result Value Ref Range Status   MRSA by PCR NEGATIVE NEGATIVE Final    Comment:        The GeneXpert MRSA Assay (FDA approved for NASAL specimens only), is one component of a comprehensive MRSA colonization surveillance program. It is not intended to diagnose MRSA infection nor to guide or monitor treatment for MRSA infections. Performed at [facility], [address], [place], [place] [address]      Labs: BNP (last 3 results) No results for input(s): BNP in the last 8760 hours. Basic Metabolic Panel: Recent Labs  Lab 09/03/19 1559 09/04/19 0451  NA 143 143  K 3.6 3.7  CL 107 108  CO2 28 27  GLUCOSE 238* 167*  BUN 16 12  CREATININE 0.71 0.59  CALCIUM 10.0 9.8   Liver Function Tests: Recent Labs  Lab 09/03/19 1559   AST 16  ALT 13  ALKPHOS 44  BILITOT 0.7  PROT 7.8  ALBUMIN 4.3   No results for input(s): LIPASE, AMYLASE in the last 168 hours. No results for input(s): AMMONIA in the last 168 hours. CBC: Recent Labs  Lab 09/03/19 1559 09/04/19 0451  WBC 6.2 5.9  NEUTROABS 2.7  --   HGB 11.2* 10.5*  HCT 36.3 34.2*  MCV 89.0 88.1  PLT 189 177   Cardiac Enzymes: No results for input(s): CKTOTAL, CKMB, CKMBINDEX, TROPONINI in the last 168 hours. BNP: Invalid input(s): POCBNP CBG: Recent Labs  Lab 09/05/19 0807 09/05/19 1144 09/05/19 1638 09/05/19 2150 09/06/19 0755  GLUCAP 105* 149* 145* 235* 119*   D-Dimer No results for input(s): DDIMER in the last 72 hours. Hgb A1c Recent Labs    09/05/19 0519  HGBA1C 6.5*   Lipid Profile No results for input(s): CHOL, HDL, LDLCALC, TRIG, CHOLHDL, LDLDIRECT in the last 72 hours. Thyroid function studies No results for input(s): TSH, T4TOTAL, T3FREE, THYROIDAB in the last 72 hours.  Invalid input(s): FREET3 Anemia work up No results for input(s): VITAMINB12, FOLATE, FERRITIN, TIBC, IRON, RETICCTPCT in the last 72 hours. Urinalysis    Component Value Date/Time   COLORURINE STRAW (A) 04/19/2018 1512   APPEARANCEUR CLEAR 04/19/2018 1512   LABSPEC 1.009 04/19/2018 1512   PHURINE 5.0 04/19/2018 1512   GLUCOSEU NEGATIVE 04/19/2018 1512   HGBUR NEGATIVE 04/19/2018 1512   BILIRUBINUR NEGATIVE 04/19/2018 1512   KETONESUR NEGATIVE 04/19/2018 1512   PROTEINUR NEGATIVE 04/19/2018 1512   NITRITE NEGATIVE 04/19/2018 1512   LEUKOCYTESUR NEGATIVE 04/19/2018 1512   Sepsis Labs Invalid input(s): PROCALCITONIN,  WBC,  LACTICIDVEN Microbiology Recent Results (from the past 240 hour(s))  SARS CORONAVIRUS 2 (TAT 6-24 HRS) Nasopharyngeal Nasopharyngeal Swab     Status: None   Collection Time: 09/03/19  6:32 PM   Specimen: Nasopharyngeal Swab  Result Value Ref Range Status   SARS Coronavirus 2 NEGATIVE NEGATIVE Final    Comment:  (NOTE) SARS-CoV-2 target nucleic acids are NOT DETECTED. The SARS-CoV-2 RNA is generally detectable in upper and lower respiratory specimens during the acute phase of infection. Negative results do not preclude SARS-CoV-2 infection, do not rule out co-infections with other pathogens, and should not be used as the sole basis for treatment or other patient management decisions. Negative results must be combined with clinical observations, patient history, and epidemiological  information. The expected result is Negative. Fact Sheet for Patients: SugarRoll.be Fact Sheet for Healthcare Providers: https://www.woods-Veronique Warga.com/ This test is not yet approved or cleared by the [place] FDA and  has been authorized for detection and/or diagnosis of SARS-CoV-2 by FDA under an Emergency Use Authorization (EUA). This EUA will remain  in effect (meaning this test can be used) for the duration of the COVID-19 declaration under Section 56 4(b)(1) of the Act, 21 U.S.C. section 360bbb-3(b)(1), unless the authorization is terminated or revoked sooner. Performed at [facility], [place] [address] [place], [place] [address]   MRSA PCR Screening     Status: None   Collection Time: 09/04/19  4:53 PM   Specimen: Nasopharyngeal  Result Value Ref Range Status   MRSA by PCR NEGATIVE NEGATIVE Final    Comment:        The GeneXpert MRSA Assay (FDA approved for NASAL specimens only), is one component of a comprehensive MRSA colonization surveillance program. It is not intended to diagnose MRSA infection nor to guide or monitor treatment for MRSA infections. Performed at [facility], [place] [address], [place], [place] [address]      Time coordinating discharge:  39 minutes  SIGNED:   Georgette Shell, MD  [facility] 09/06/2019, 9:58 AM Pager   If 7PM-7AM, please contact night-coverage [email or web address] Password [email or web address]

## 2019-09-06 NOTE — Progress Notes (Signed)
Physical Therapy Treatment Patient Details Name: Sydney Gilbert MRN: [id number] DOB: [birth year] Today's Date: 09/06/2019    History of Present Illness 79 y.o. female with history of cervical surgery, diabetes mellitus type 2, hypertension, hyperlipidemia, depression presents to the ER with complaints of having increasing swelling of the right side of the face.    CT maxillofacial shows  features concerning for inflammatory changes involving the right side of the face from the inferior margin of the right orbit to the mandible.  No discrete lesions    PT Comments    Pt very cooperative and progressing slowly but steadily with mobility.  Pt hopeful for dc home with sister this date.   Follow Up Recommendations  Home health PT     Equipment Recommendations  None recommended by PT    Recommendations for Other Services       Precautions / Restrictions Precautions Precautions: Fall Restrictions Weight Bearing Restrictions: No    Mobility  Bed Mobility Overal bed mobility: Needs Assistance Bed Mobility: Supine to Sit     Supine to sit: Supervision     General bed mobility comments: for safety, incr time   Transfers Overall transfer level: Needs assistance Equipment used: Rolling walker (2 wheeled) Transfers: Sit to/from Stand Sit to Stand: Min guard;Supervision         General transfer comment: pt self cues for hand placement; min guard on first attempt to stand but supervion on second attempt  Ambulation/Gait Ambulation/Gait assistance: Min guard Gait Distance (Feet): 90 Feet Assistive device: Rolling walker (2 wheeled) Gait Pattern/deviations: Step-through pattern;Decreased stride length;Trunk flexed Gait velocity: decr   General Gait Details: cues for posture, RW position, steady assist for balance; pt maintains knee flexion throughout gait cycle, some slight intermittent buckling on R. cues for incr knee extension and use of RW/UE support.  One standing rest  break to complete task   Stairs             Wheelchair Mobility    Modified Rankin (Stroke Patients Only)       Balance Overall balance assessment: Needs assistance Sitting-balance support: No upper extremity supported;Feet supported Sitting balance-Leahy Scale: Good       Standing balance-Leahy Scale: Poor Standing balance comment: reliant on UEs for static stand                            Cognition Arousal/Alertness: Awake/alert Behavior During Therapy: WFL for tasks assessed/performed Overall Cognitive Status: Within Functional Limits for tasks assessed                                        Exercises General Exercises - Lower Extremity Ankle Circles/Pumps: AROM;Both;10 reps Quad Sets: AROM;Both;10 reps;Supine Heel Slides: AROM;Both;10 reps;Limitations Straight Leg Raises: AROM;AAROM;Both;10 reps    General Comments        Pertinent Vitals/Pain Pain Assessment: No/denies pain    Home Living                      Prior Function            PT Goals (current goals can now be found in the care plan section) Acute Rehab PT Goals Patient Stated Goal: get stronger PT Goal Formulation: With patient Time For Goal Achievement: 09/17/19 Potential to Achieve Goals: Good Progress towards PT goals: Progressing toward goals  Frequency    Min 3X/week      PT Plan Current plan remains appropriate    Co-evaluation              AM-PAC PT "6 Clicks" Mobility   Outcome Measure  Help needed turning from your back to your side while in a flat bed without using bedrails?: None Help needed moving from lying on your back to sitting on the side of a flat bed without using bedrails?: None Help needed moving to and from a bed to a chair (including a wheelchair)?: A Little Help needed standing up from a chair using your arms (e.g., wheelchair or bedside chair)?: A Little Help needed to walk in hospital room?: A  Little Help needed climbing 3-5 steps with a railing? : A Lot 6 Click Score: 19    End of Session Equipment Utilized During Treatment: Gait belt       PT Visit Diagnosis: Unsteadiness on feet (R26.81);Other abnormalities of gait and mobility (R26.89)     Time: 2094-7096 PT Time Calculation (min) (ACUTE ONLY): 24 min  Charges:  $Gait Training: 8-22 mins $Therapeutic Exercise: 8-22 mins                     [place] Pager [phone] Office [phone]    Tyheem Boughner 09/06/2019, 9:42 AM

## 2019-09-06 NOTE — Care Management Important Message (Signed)
Important Message  Patient Details IM Letter given to [name] SW Case Manager to present to the [facility] Name: Sydney Gilbert MRN: [id number] Date of Birth: 1940/07/04   Medicare Important Message Given:  Yes     Caren Macadam 09/06/2019, 10:44 AM

## 2019-09-06 NOTE — Progress Notes (Signed)
RN reviewed discharge instructions with patient. All questions answered.   Paperwork given.     NT rolled patient down in personal wheelchair to [facility] transport vehicle.      [place], Charity fundraiser

## 2020-01-03 ENCOUNTER — Emergency Department (HOSPITAL_COMMUNITY)
Admission: EM | Admit: 2020-01-03 | Discharge: 2020-01-03 | Disposition: A | Discharge status: Home / Self Care | Payer: Medicare (Managed Care) | Attending: Emergency Medicine | Admitting: Emergency Medicine

## 2020-01-03 ENCOUNTER — Emergency Department (HOSPITAL_COMMUNITY): Payer: Medicare (Managed Care)

## 2020-01-03 DIAGNOSIS — E119 Type 2 diabetes mellitus without complications: Secondary | ICD-10-CM | POA: Insufficient documentation

## 2020-01-03 DIAGNOSIS — F17210 Nicotine dependence, cigarettes, uncomplicated: Secondary | ICD-10-CM | POA: Insufficient documentation

## 2020-01-03 DIAGNOSIS — S5002XA Contusion of left elbow, initial encounter: Secondary | ICD-10-CM | POA: Diagnosis not present

## 2020-01-03 DIAGNOSIS — S60222A Contusion of left hand, initial encounter: Secondary | ICD-10-CM

## 2020-01-03 DIAGNOSIS — Z7984 Long term (current) use of oral hypoglycemic drugs: Secondary | ICD-10-CM | POA: Insufficient documentation

## 2020-01-03 DIAGNOSIS — Z79899 Other long term (current) drug therapy: Secondary | ICD-10-CM | POA: Diagnosis not present

## 2020-01-03 DIAGNOSIS — S0081XA Abrasion of other part of head, initial encounter: Secondary | ICD-10-CM | POA: Diagnosis not present

## 2020-01-03 DIAGNOSIS — W01198A Fall on same level from slipping, tripping and stumbling with subsequent striking against other object, initial encounter: Secondary | ICD-10-CM | POA: Diagnosis not present

## 2020-01-03 DIAGNOSIS — I10 Essential (primary) hypertension: Secondary | ICD-10-CM | POA: Diagnosis not present

## 2020-01-03 DIAGNOSIS — S0003XA Contusion of scalp, initial encounter: Secondary | ICD-10-CM

## 2020-01-03 DIAGNOSIS — Y92018 Other place in single-family (private) house as the place of occurrence of the external cause: Secondary | ICD-10-CM | POA: Diagnosis not present

## 2020-01-03 DIAGNOSIS — Y999 Unspecified external cause status: Secondary | ICD-10-CM | POA: Diagnosis not present

## 2020-01-03 DIAGNOSIS — S0990XA Unspecified injury of head, initial encounter: Secondary | ICD-10-CM | POA: Diagnosis present

## 2020-01-03 DIAGNOSIS — Y9301 Activity, walking, marching and hiking: Secondary | ICD-10-CM | POA: Diagnosis not present

## 2020-01-03 DIAGNOSIS — S2232XA Fracture of one rib, left side, initial encounter for closed fracture: Secondary | ICD-10-CM

## 2020-01-03 DIAGNOSIS — S40012A Contusion of left shoulder, initial encounter: Secondary | ICD-10-CM

## 2020-01-03 DIAGNOSIS — W19XXXA Unspecified fall, initial encounter: Secondary | ICD-10-CM

## 2020-01-03 LAB — BASIC METABOLIC PANEL
Anion gap: 9 (ref 5–15)
BUN: 11 mg/dL (ref 8–23)
CO2: 26 mmol/L (ref 22–32)
Calcium: 9.5 mg/dL (ref 8.9–10.3)
Chloride: 104 mmol/L (ref 98–111)
Creatinine, Ser: 0.65 mg/dL (ref 0.44–1.00)
GFR calc Af Amer: >60 mL/min (ref >60)
GFR calc non Af Amer: >60 mL/min (ref >60)
Glucose, Bld: 253 mg/dL — ABNORMAL HIGH (ref 70–99)
Potassium: 3.8 mmol/L (ref 3.5–5.1)
Sodium: 139 mmol/L (ref 135–145)

## 2020-01-03 LAB — CBC WITH DIFFERENTIAL/PLATELET
Abs Immature Granulocytes: 0.01 10*3/uL (ref 0.00–0.07)
Basophils Absolute: 0 10*3/uL (ref 0.0–0.1)
Basophils Relative: 1 %
Eosinophils Absolute: 0.2 10*3/uL (ref 0.0–0.5)
Eosinophils Relative: 4 %
HCT: 33.7 % — ABNORMAL LOW (ref 36.0–46.0)
Hemoglobin: 10.7 g/dL — ABNORMAL LOW (ref 12.0–15.0)
Immature Granulocytes: 0 %
Lymphocytes Relative: 39 %
Lymphs Abs: 2.4 10*3/uL (ref 0.7–4.0)
MCH: 27.8 pg (ref 26.0–34.0)
MCHC: 31.8 g/dL (ref 30.0–36.0)
MCV: 87.5 fL (ref 80.0–100.0)
Monocytes Absolute: 0.4 10*3/uL (ref 0.1–1.0)
Monocytes Relative: 6 %
Neutro Abs: 3.2 10*3/uL (ref 1.7–7.7)
Neutrophils Relative %: 50 %
Platelets: 173 10*3/uL (ref 150–400)
RBC: 3.85 MIL/uL — ABNORMAL LOW (ref 3.87–5.11)
RDW: 14.4 % (ref 11.5–15.5)
WBC: 6.3 10*3/uL (ref 4.0–10.5)
nRBC: 0 % (ref 0.0–0.2)

## 2020-01-03 MED ORDER — LIDOCAINE 5 % EX PTCH
1.0000 | MEDICATED_PATCH | CUTANEOUS | 0 refills | Status: DC
Start: 2020-01-03 — End: 2023-04-14

## 2020-01-03 MED ORDER — OXYCODONE-ACETAMINOPHEN 5-325 MG PO TABS
1.0000 | ORAL_TABLET | Freq: Once | ORAL | Status: AC
  Administered 2020-01-03: 1 via ORAL
  Filled 2020-01-03: qty 1

## 2020-01-03 NOTE — ED Notes (Addendum)
Received notification from secretary-Mandy, pt family will be coming to pick pt up for discharge ride home.,

## 2020-01-03 NOTE — ED Provider Notes (Signed)
[facility] EMERGENCY DEPARTMENT Provider Note   CSN: [id number] Arrival date & time: 01/03/20  1414     History Chief Complaint  Patient presents with  . Fall    yesterday    Sydney Gilbert is a 79 y.o. female sent in from her primary care physician's office for evaluation of injury after fall.  Patient states that she got tripped up on her bedroom slippers yesterday and fell against a heat vent.  She hit her head and got a slight burn to her forehead.  She also complains of pain in her left hand and left shoulder.  She states that the pain was severe she was unable to sleep last night.  She denies shortness of breath abdominal pain or inability to ambulate but does have pain in her left rib cage.  Patient does not take blood  HPI     Past Medical History:  Diagnosis Date  . Anemia   . Arthritis   . Depression   . Diabetes mellitus without complication (HCC)   . GERD (gastroesophageal reflux disease)   . Hypertension   . Spinal stenosis     Patient Active Problem List   Diagnosis Date Noted  . Facial cellulitis 09/03/2019  . Type 2 diabetes mellitus with hyperglycemia (HCC) 09/03/2019  . Essential hypertension 09/03/2019  . HLD (hyperlipidemia) 09/03/2019    No past surgical history on file.   OB History   No obstetric history on file.     Family History  Family history unknown: Yes    Social History   Tobacco Use  . Smoking status: Current Every Day Smoker    Types: Cigarettes  . Smokeless tobacco: Never Used  Substance Use Topics  . Alcohol use: Not Currently  . Drug use: Never    Home Medications Prior to Admission medications   Medication Sig Start Date End Date Taking? Authorizing Provider  acetaminophen (TYLENOL) 500 MG tablet Take 1,000 mg by mouth 3 (three) times daily. For Pain    [provider]  baclofen (LIORESAL) 10 MG tablet Take 5 mg by mouth in the morning, at noon, and at bedtime.     [provider]  calcium-vitamin D (OSCAL WITH D) 500-200 MG-UNIT tablet Take 1 tablet by mouth daily.    [provider]  carvedilol (COREG) 12.5 MG tablet Take 12.5 mg by mouth 2 (two) times daily with a meal.     [provider]  DULoxetine (CYMBALTA) 60 MG capsule Take 60 mg by mouth daily. (DO NOT CRUSH)    [provider]  famotidine (PEPCID) 20 MG tablet Take 20 mg by mouth 2 (two) times daily. AS NEEDED FOR HEARTBURN    [provider]  gabapentin (NEURONTIN) 300 MG capsule Take 300 mg by mouth 3 (three) times daily. (DO NOT CRUSH)    [provider]  glipiZIDE (GLUCOTROL XL) 2.5 MG 24 hr tablet 2.5 mg. TABLET TAKE 1 TABLET BY MOUTH ONCE DAILY FOR DM    [provider]  lisinopril (ZESTRIL) 40 MG tablet Take 40 mg by mouth daily. FOR HTN    [provider]  oxyCODONE (OXYCONTIN) 10 mg 12 hr tablet 10 mg. Give 1 tablet by mouth daily for post op cervical laminectomy    [provider]  senna-docusate (SENOKOT-S) 8.6-50 MG tablet Take 2 tablets by mouth at bedtime.     [provider]  simvastatin (ZOCOR) 10 MG tablet Take 10 mg by mouth every evening.  11/16/18   [provider]  Vitamins A & D (VITAMIN A & D) ointment Apply topically 3 (three) times daily. 09/06/19   Alwyn Ren, MD    Allergies    Metformin and related, Penicillins, and Varenicline  Review of Systems   Review of Systems Ten systems reviewed and are negative for acute change, except as noted in the HPI.   Physical Exam Updated Vital Signs Temp 98.7 F (37.1 C) (Oral)   Ht 5\' 7"  (1.702 m)   Wt 65.8 kg   BMI 22.71 kg/m   Physical Exam Vitals and nursing note reviewed.  Constitutional:      General: She is not in acute distress.    Appearance: She is well-developed. She is not diaphoretic.  HENT:     Head: Normocephalic.     Comments:   Abrasion to the left forehead Eyes:     General: No scleral icterus.    Extraocular  Movements: Extraocular movements intact.     Conjunctiva/sclera: Conjunctivae normal.     Pupils: Pupils are equal, round, and reactive to light.  Cardiovascular:     Rate and Rhythm: Normal rate and regular rhythm.     Heart sounds: Normal heart sounds. No murmur heard.  No friction rub. No gallop.   Pulmonary:     Effort: Pulmonary effort is normal. No respiratory distress.     Breath sounds: Normal breath sounds.  Abdominal:     General: Bowel sounds are normal. There is no distension.     Palpations: Abdomen is soft. There is no mass.     Tenderness: There is no abdominal tenderness. There is no guarding.  Musculoskeletal:     Cervical back: Normal range of motion.     Comments: Bruising.  No bony tenderness of the left elbow.  Significant bruising and to TT palpation of the left proximal humerus  Swelling over the dorsum of the left hand particularly over the third MTP   Skin:    General: Skin is warm and dry.  Neurological:     Mental Status: She is alert and oriented to person, place, and time.  Psychiatric:        Behavior: Behavior normal.     ED Results / Procedures / Treatments   Labs (all labs ordered are listed, but only abnormal results are displayed) Labs Reviewed - No data to display  EKG None  Radiology No results found.  Procedures Procedures (including critical care time)  Medications Ordered in ED Medications - No data to display  ED Course  I have reviewed the triage vital signs and the nursing notes.  Pertinent labs & imaging results that were available during my care of the patient were reviewed by me and considered in my medical decision making (see chart for details).    MDM Rules/Calculators/A&P                           Final Clinical Impression(s) / ED Diagnoses Final diagnoses:  None   79 year old female here for evaluation after fall. The emergent differential diagnosis for trauma is extensive and requires complex medical  decision making. The differential includes, but is not limited to traumatic brain injury, Orbital trauma, maxillofacial trauma, skull fracture, blunt/penetrating neck trauma, vertebral artery dissection, whiplash, cervical fracture, neurogenic shock, spinal cord injury, thoracic trauma (blunt/penetrating) cardiac trauma, thoracic and lumbar spine trauma. Abdominal trauma (blunt. Penetrating), genitourinary trauma, extremity fractures, skin lacerations/ abrasions, vascular injuries.  I ordered basic labs including BMP which elevated blood glucose, CBC with mild normocytic anemia.  I personally ordered interpreted and reviewed images of the the left hand, left humerus, left ribs, CT C-spine and CT head.  There is a significant finding of left posterior ninth rib fracture.  The remainder of the patient's imaging is negative for acute abnormality.  Patient is currently prescribed.  Pain medication.  She will be discharged with her current regimen.  Patient is to follow-up with her primary care physician soon as possible.  Rx / DC Orders ED Discharge Orders    None       Arthor Captain, PA-C 01/04/20 1451    Benjiman Core, MD 01/10/20 1451

## 2020-01-03 NOTE — Discharge Instructions (Addendum)
Contact a health care provider if: You have a fever. Get help right away if: You have difficulty breathing or you are short of breath. You develop a cough that does not stop, or you cough up thick or bloody sputum. You have nausea, vomiting, or pain in your abdomen. Your pain gets worse and medicine does not help. 

## 2020-01-03 NOTE — ED Notes (Signed)
Patient verbalizes understanding of discharge instructions. Opportunity for questioning and answers were provided. Armband removed by staff, pt discharged from ED via [facility].  

## 2020-01-03 NOTE — ED Triage Notes (Addendum)
Pt to ED via EMS apparently pt fell yesterday while at home, mechanical fall, tripped and fell on heater, No LOC, EMS noted  bruising to left shoulder, left hand, forehead. Pt went to doctors office for this complaint. PCP called EMS for further assessment. No medications given by EMS. Orientation at baseline .Last VS:144/68, CBG 405- HX DM, p 88, 99%RA, 97.5 temp

## 2020-01-03 NOTE — ED Notes (Signed)
[facility] canceled @ 1832-per Morrie Sheldon, RN called by Marylene Land

## 2020-01-03 NOTE — ED Notes (Signed)
Pt transported to xray 

## 2020-01-03 NOTE — ED Notes (Signed)
[facility] called @ 1830-per Morrie Sheldon, RN called by Marylene Land

## 2020-10-04 IMAGING — MR MR THORACIC SPINE W/O CM
4 of 6 series · 19 of 48 positions shown · non-contrast
Comparison: MRI lumbar spine dated April 19, 2018. Chest x-ray
dated August 21, 2026. MRI cervical spine dated January 13, 2011.

CLINICAL DATA: Chronic mid to low back pain radiating into both
legs. Bilateral leg weakness and muscle spasms.

EXAM:
MRI CERVICAL, THORACIC AND LUMBAR SPINE WITHOUT CONTRAST
TECHNIQUE: Multiplanar and multiecho pulse sequences of the cervical spine, to
include the craniocervical junction and cervicothoracic junction,
and thoracic and lumbar spine, were obtained without intravenous
contrast.

[Series 17: T1 · sagittal · 3.0mm · 0.83mm/px · 3 of 19 slices shown]
[im 1/19]
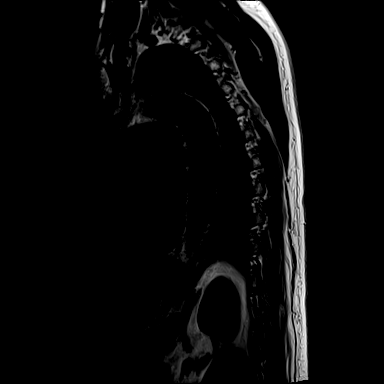
[im 10/19]
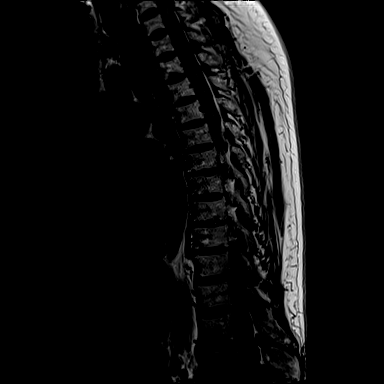
[im 19/19]
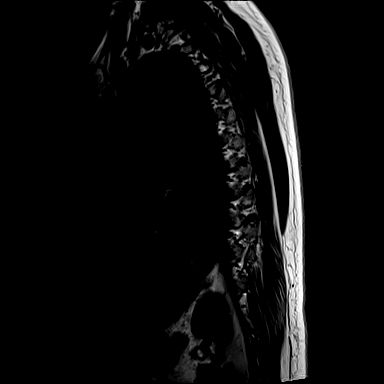

[Series 18: STIR · sagittal · 3.0mm · 1.00mm/px · 3 of 19 slices shown]
[im 4/19]
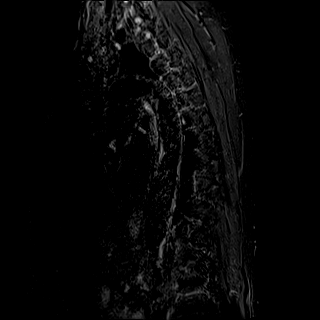
[im 11/19]
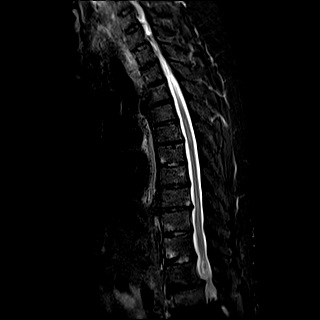
[im 19/19]
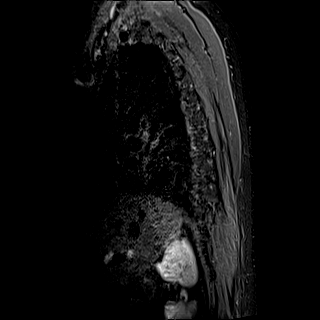

[Series 19: T2 · sagittal · 3.0mm · 0.83mm/px · 6 of 19 slices shown (1 of 2)]
[im 1/19]
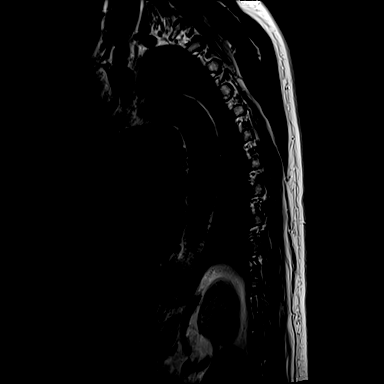
[im 4/19]
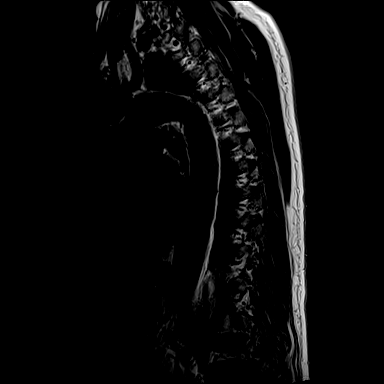
[im 8/19]
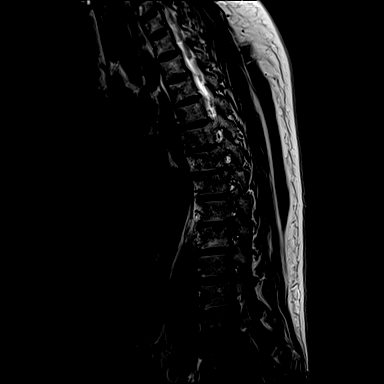
[im 11/19]
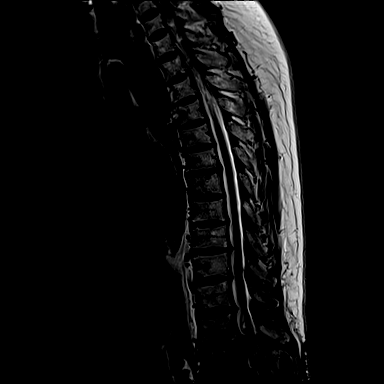
[im 15/19]
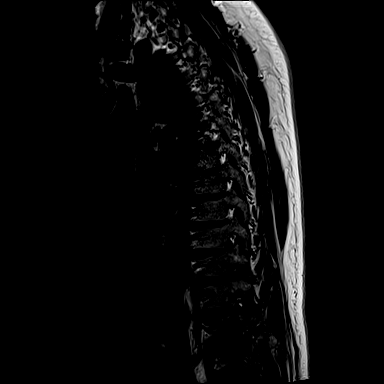
[im 19/19]
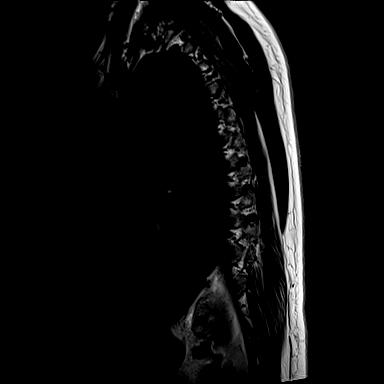

[Series 20: T2 · axial · 4.0mm · 0.28mm/px · z∈[-284,-57]mm · 7 of 46 slices shown (2 of 2)]
[im 1/46]
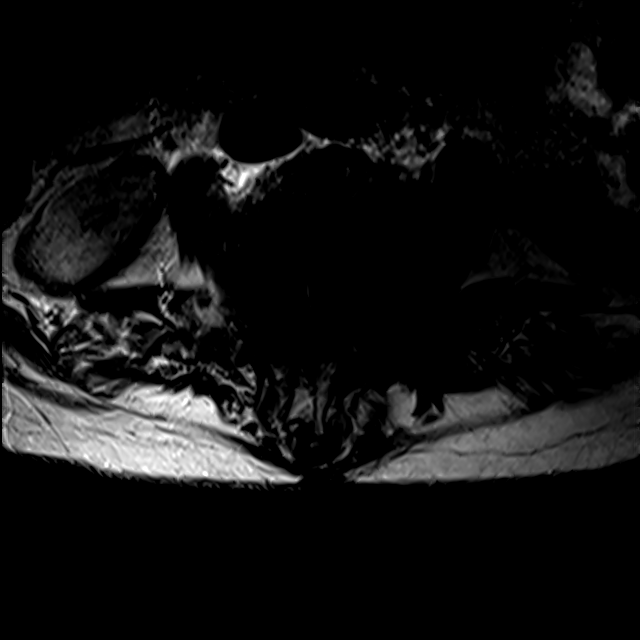
[im 7/46]
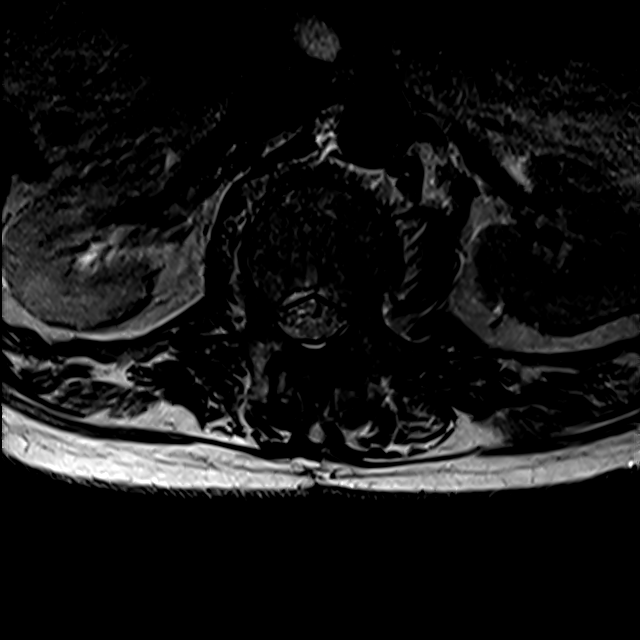
[im 14/46]
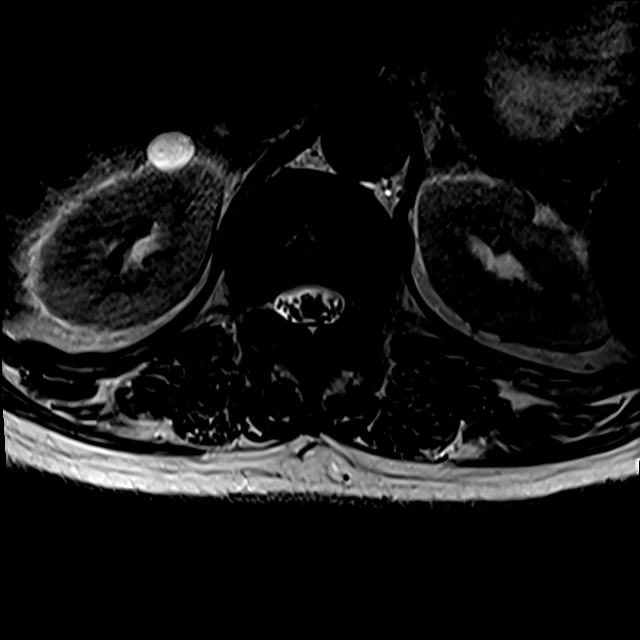
[im 21/46]
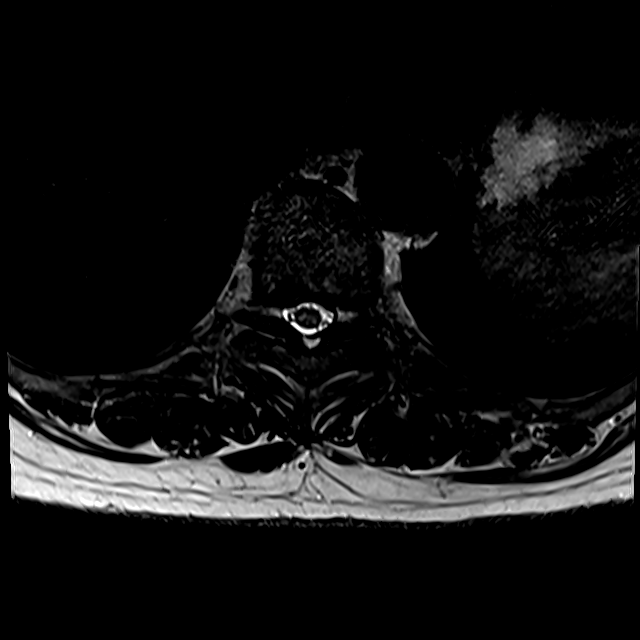
[im 25/46]
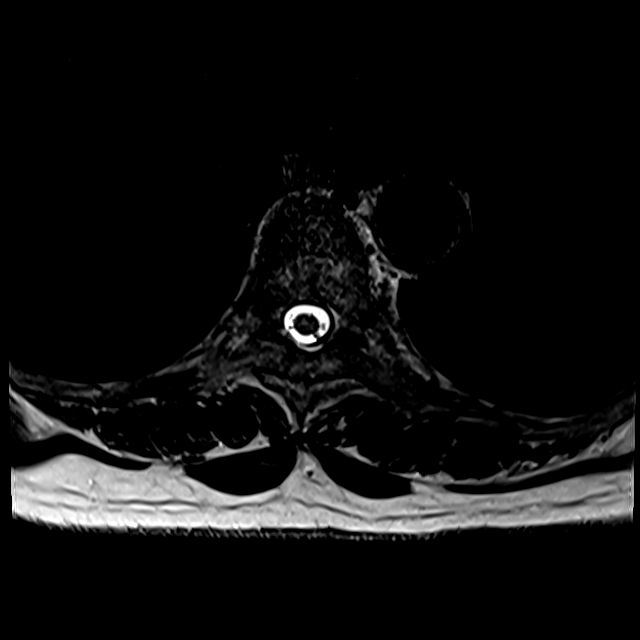
[im 32/46]
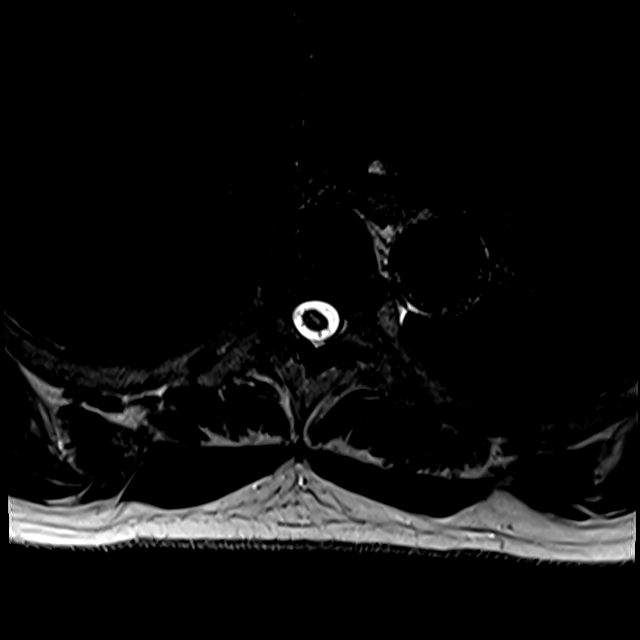
[im 39/46]
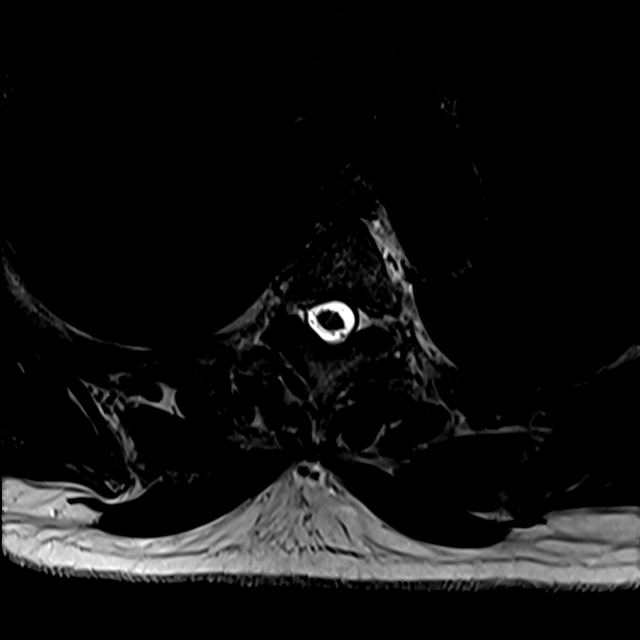

[19 of 48 positions shown; findings below may reference images not displayed]

FINDINGS: MRI CERVICAL SPINE FINDINGS

Alignment: Straightening of the normal cervical lordosis.

Vertebrae: No fracture, evidence of discitis, or bone lesion.

Cord: New small focus of myelomalacia in the left hemicord at C3-C4.

Posterior Fossa, vertebral arteries, paraspinal tissues: Old small
lacunar infarcts in the pons. Otherwise negative.

Disc levels:

C2-C3:  Negative.

C3-C4: Progressive ligamentum flavum hypertrophy with unchanged mild
disc bulging and congenitally short pedicles resulting in
progressive moderate central spinal canal stenosis and flattening of
the cord. No neuroforaminal stenosis.

C4-C5: Unchanged mild disc bulge, ligamentum flavum hypertrophy, and
congenitally short pedicles resulting in moderate spinal canal
stenosis. No neuroforaminal stenosis.

C5-C6: Unchanged mild disc bulging and right uncovertebral
hypertrophy resulting in mild spinal canal stenosis with slight
flattening of the right ventral cord. No neuroforaminal stenosis.

C6-C7: Unchanged mild left eccentric disc bulging. New asymmetric
prominent left ligamentum flavum hypertrophy resulting in severe
left sided spinal canal stenosis with mass effect on the left dorsal
cord. No neuroforaminal stenosis.

C7-T1:  Negative.

MRI THORACIC SPINE FINDINGS

Alignment:  Physiologic.

Vertebrae: No fracture, evidence of discitis, or bone lesion.
Scattered mild degenerative endplate marrow edema.

Cord:  Normal signal and morphology.

Paraspinal and other soft tissues: Negative.

Disc levels:

T1-T2: Negative.

T2-T3: Tiny right paracentral disc protrusion.  No stenosis.

T3-T4: Tiny right paracentral disc protrusion.  No stenosis.

T4-T5: Negative.

T5-T6: Negative.

T6-T7: Tiny central disc protrusion.  No stenosis.

T7-T8: Negative.

T8-T9: Small central disc protrusion.  No stenosis.

T9-T10: Mild disc bulging. Borderline mild spinal canal stenosis. No
neuroforaminal stenosis.

T10-T11: Mild disc bulging and ligamentum flavum hypertrophy.
Borderline mild spinal canal stenosis. Mild bilateral neuroforaminal
stenosis.

T11-T12: Shallow broad-based posterior disc protrusion and
ligamentum flavum hypertrophy. Mild bilateral neuroforaminal
stenosis. No spinal canal stenosis.

MRI LUMBAR SPINE FINDINGS

Segmentation:  Standard.

Alignment:  Unchanged 5 mm anterolisthesis at T12-L1.

Vertebrae: Prior L4-S1 fusion with extensive susceptibility
artifact. No fracture, evidence of discitis, or focal bone lesion.

Conus medullaris and cauda equina: Conus extends to the T12-L1
level. Conus and cauda equina appear normal.

Paraspinal and other soft tissues: Negative.

Disc levels:

T12-L1: Unchanged disc bulging, endplate spurring, and facet
arthropathy resulting in moderate spinal canal stenosis and severe
left and moderate right neuroforaminal stenosis.

L1-L2: Unchanged disc bulging, endplate spurring, facet arthropathy,
and ligamentum flavum hypertrophy. Unchanged severe spinal canal
stenosis and moderate bilateral neuroforaminal stenosis.

L2-L3: Unchanged diffuse disc bulging and moderate bilateral facet
arthropathy with ligamentum flavum hypertrophy. Unchanged severe
spinal canal stenosis and moderate right greater than left
neuroforaminal stenosis.

L3-L4: Largely obscured by susceptibility artifact. No spinal canal
stenosis.

L4-L5:  Prior fusion.  Obscured by susceptibility artifact..

L5-S1:  Prior fusion.  Obscured by susceptibility artifact.
IMPRESSION: Cervical spine:

1. Progressive spondylosis at C6-C7 with prominent asymmetric left
ligamentum flavum hypertrophy resulting in severe left-sided spinal
canal stenosis with mass effect on the left dorsal cord.
2. Progressive ligamentum flavum hypertrophy at C3-C4 with moderate
spinal canal stenosis, flattening of the cord, and new small focus
of myelomalacia in the left hemicord.
3. Unchanged moderate spinal canal stenosis at C4-C5.

Thoracic spine:

1. Mild multilevel thoracic spondylosis as described above. No more
than mild stenosis from T9-T10 through T11-T12.

Lumbar spine:

1. Unchanged severe spinal canal stenosis and moderate bilateral
neuroforaminal stenosis at L1-L2 and L2-L3.
2. Unchanged moderate spinal canal stenosis and severe left
neuroforaminal stenosis at T12-L1.
3. Prior L4-S1 fusion with extensive susceptibility artifact
limiting evaluation at these levels.

## 2020-10-04 IMAGING — MR MR CERVICAL SPINE W/O CM
4 of 5 series · 25 of 48 positions shown · non-contrast
Comparison: MRI lumbar spine dated April 19, 2018. Chest x-ray
dated August 21, 2026. MRI cervical spine dated January 13, 2011.

CLINICAL DATA: Chronic mid to low back pain radiating into both
legs. Bilateral leg weakness and muscle spasms.

EXAM:
MRI CERVICAL, THORACIC AND LUMBAR SPINE WITHOUT CONTRAST
TECHNIQUE: Multiplanar and multiecho pulse sequences of the cervical spine, to
include the craniocervical junction and cervicothoracic junction,
and thoracic and lumbar spine, were obtained without intravenous
contrast.

[Series 6: T1 · sagittal · 3.0mm · 0.66mm/px · 6 of 17 slices shown]
[im 1/17]
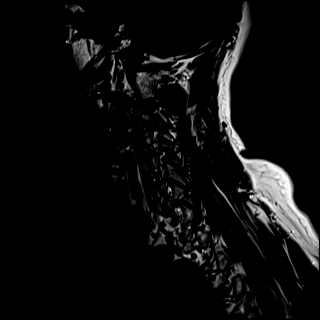
[im 4/17]
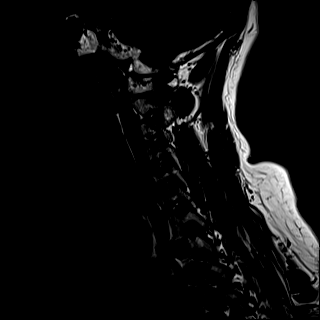
[im 7/17]
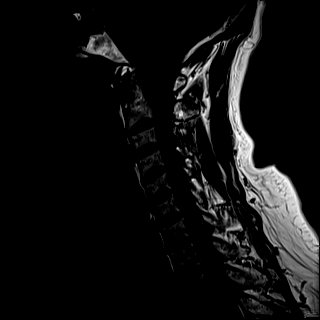
[im 10/17]
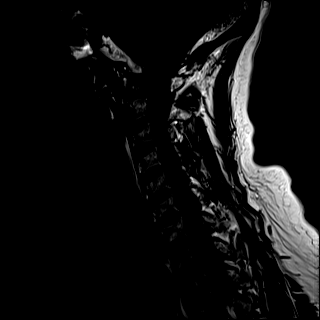
[im 13/17]
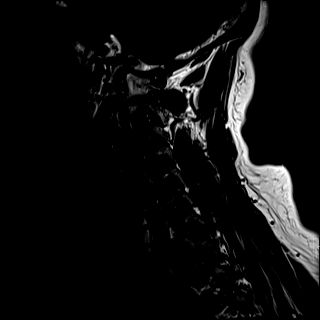
[im 17/17]
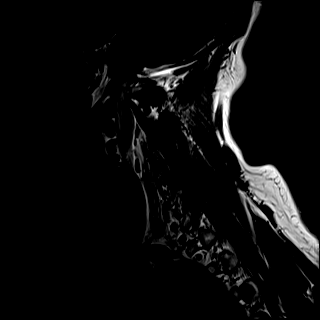

[Series 7: T2 · sagittal · 3.0mm · 0.55mm/px · 7 of 17 slices shown (1 of 2)]
[im 1/17]
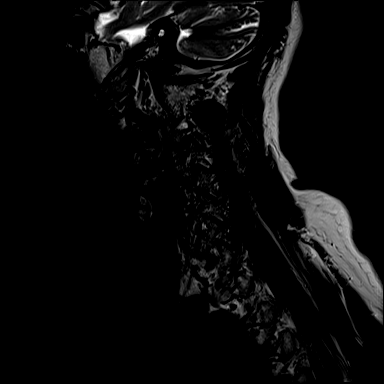
[im 3/17]
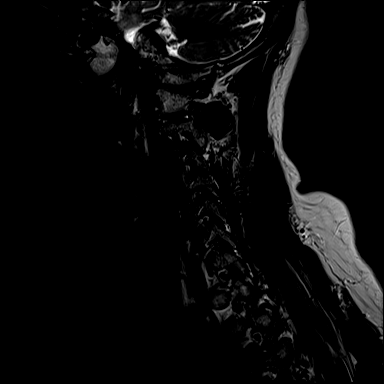
[im 6/17]
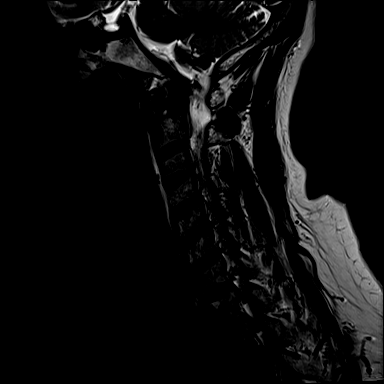
[im 9/17]
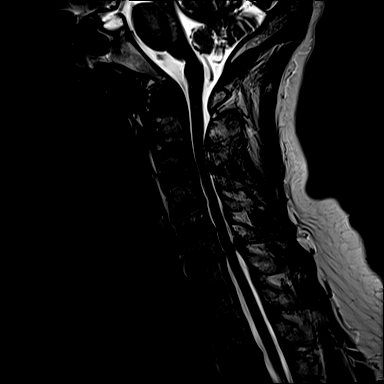
[im 11/17]
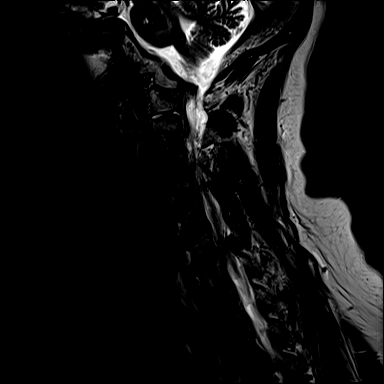
[im 14/17]
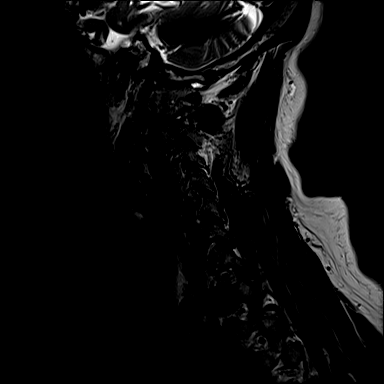
[im 17/17]
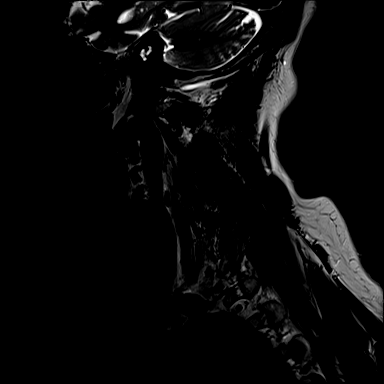

[Series 8: STIR · sagittal · 3.0mm · 0.33mm/px · 4 of 17 slices shown]
[im 1/17]
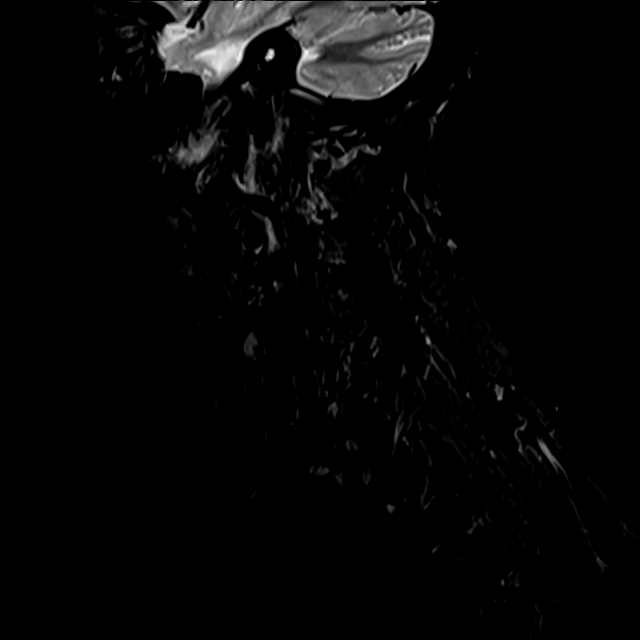
[im 3/17]
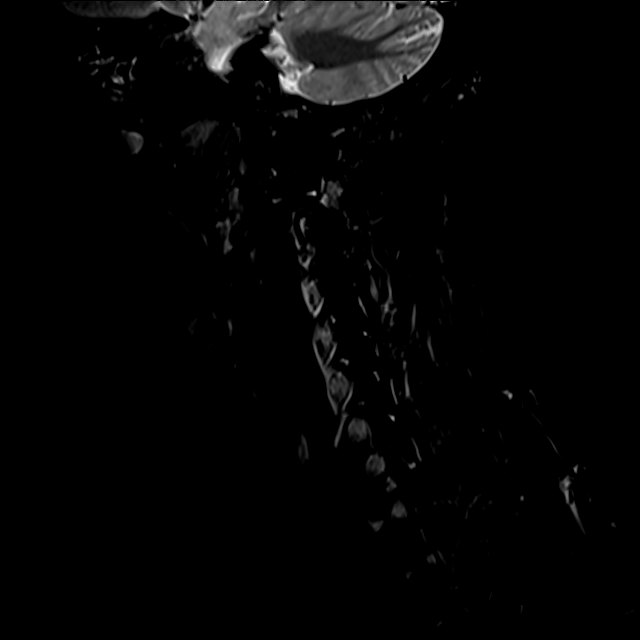
[im 9/17]
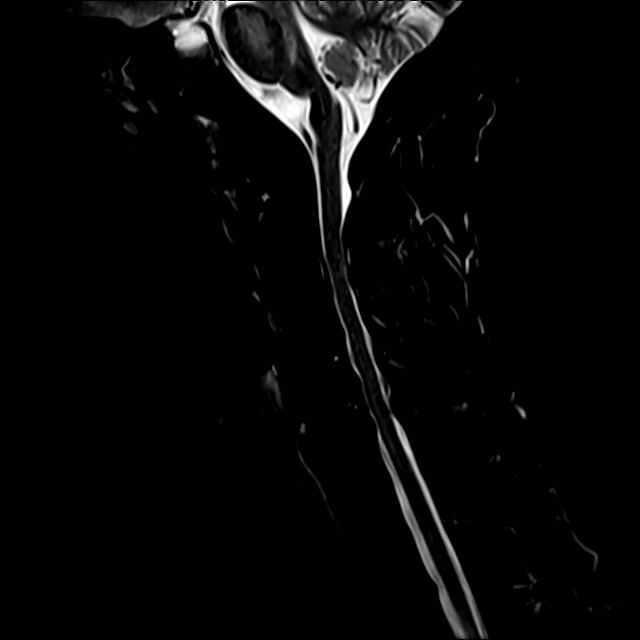
[im 14/17]
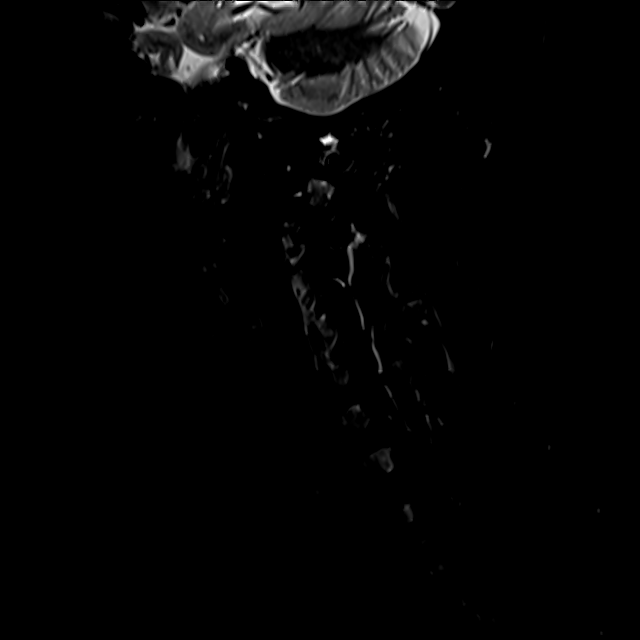

[Series 9: T2 · axial · 3.0mm · 0.50mm/px · z∈[-58,+44]mm · 8 of 34 slices shown (2 of 2)]
[im 1/34]
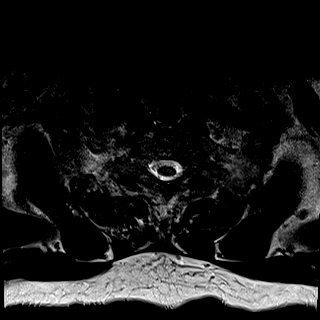
[im 6/34]
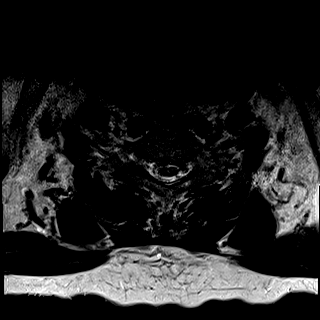
[im 11/34]
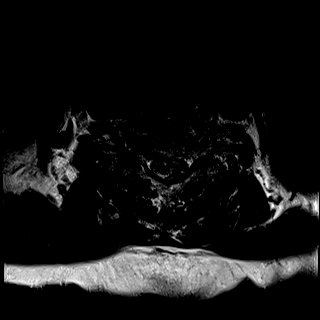
[im 16/34]
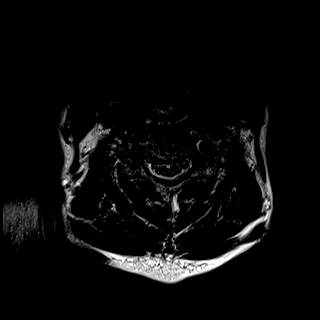
[im 18/34]
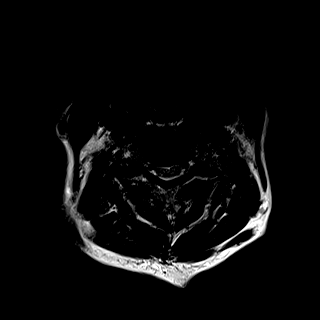
[im 23/34]
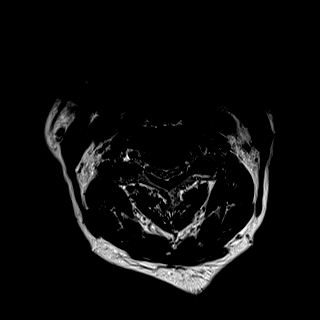
[im 28/34]
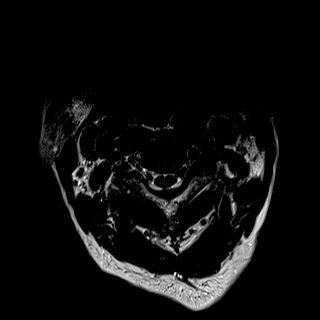
[im 34/34]
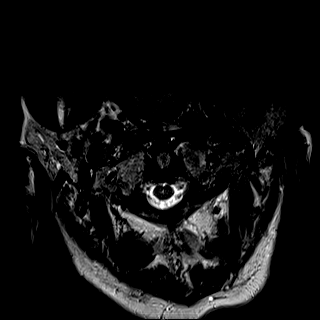

[25 of 48 positions shown; findings below may reference images not displayed]

FINDINGS: MRI CERVICAL SPINE FINDINGS

Alignment: Straightening of the normal cervical lordosis.

Vertebrae: No fracture, evidence of discitis, or bone lesion.

Cord: New small focus of myelomalacia in the left hemicord at C3-C4.

Posterior Fossa, vertebral arteries, paraspinal tissues: Old small
lacunar infarcts in the pons. Otherwise negative.

Disc levels:

C2-C3:  Negative.

C3-C4: Progressive ligamentum flavum hypertrophy with unchanged mild
disc bulging and congenitally short pedicles resulting in
progressive moderate central spinal canal stenosis and flattening of
the cord. No neuroforaminal stenosis.

C4-C5: Unchanged mild disc bulge, ligamentum flavum hypertrophy, and
congenitally short pedicles resulting in moderate spinal canal
stenosis. No neuroforaminal stenosis.

C5-C6: Unchanged mild disc bulging and right uncovertebral
hypertrophy resulting in mild spinal canal stenosis with slight
flattening of the right ventral cord. No neuroforaminal stenosis.

C6-C7: Unchanged mild left eccentric disc bulging. New asymmetric
prominent left ligamentum flavum hypertrophy resulting in severe
left sided spinal canal stenosis with mass effect on the left dorsal
cord. No neuroforaminal stenosis.

C7-T1:  Negative.

MRI THORACIC SPINE FINDINGS

Alignment:  Physiologic.

Vertebrae: No fracture, evidence of discitis, or bone lesion.
Scattered mild degenerative endplate marrow edema.

Cord:  Normal signal and morphology.

Paraspinal and other soft tissues: Negative.

Disc levels:

T1-T2: Negative.

T2-T3: Tiny right paracentral disc protrusion.  No stenosis.

T3-T4: Tiny right paracentral disc protrusion.  No stenosis.

T4-T5: Negative.

T5-T6: Negative.

T6-T7: Tiny central disc protrusion.  No stenosis.

T7-T8: Negative.

T8-T9: Small central disc protrusion.  No stenosis.

T9-T10: Mild disc bulging. Borderline mild spinal canal stenosis. No
neuroforaminal stenosis.

T10-T11: Mild disc bulging and ligamentum flavum hypertrophy.
Borderline mild spinal canal stenosis. Mild bilateral neuroforaminal
stenosis.

T11-T12: Shallow broad-based posterior disc protrusion and
ligamentum flavum hypertrophy. Mild bilateral neuroforaminal
stenosis. No spinal canal stenosis.

MRI LUMBAR SPINE FINDINGS

Segmentation:  Standard.

Alignment:  Unchanged 5 mm anterolisthesis at T12-L1.

Vertebrae: Prior L4-S1 fusion with extensive susceptibility
artifact. No fracture, evidence of discitis, or focal bone lesion.

Conus medullaris and cauda equina: Conus extends to the T12-L1
level. Conus and cauda equina appear normal.

Paraspinal and other soft tissues: Negative.

Disc levels:

T12-L1: Unchanged disc bulging, endplate spurring, and facet
arthropathy resulting in moderate spinal canal stenosis and severe
left and moderate right neuroforaminal stenosis.

L1-L2: Unchanged disc bulging, endplate spurring, facet arthropathy,
and ligamentum flavum hypertrophy. Unchanged severe spinal canal
stenosis and moderate bilateral neuroforaminal stenosis.

L2-L3: Unchanged diffuse disc bulging and moderate bilateral facet
arthropathy with ligamentum flavum hypertrophy. Unchanged severe
spinal canal stenosis and moderate right greater than left
neuroforaminal stenosis.

L3-L4: Largely obscured by susceptibility artifact. No spinal canal
stenosis.

L4-L5:  Prior fusion.  Obscured by susceptibility artifact..

L5-S1:  Prior fusion.  Obscured by susceptibility artifact.
IMPRESSION: Cervical spine:

1. Progressive spondylosis at C6-C7 with prominent asymmetric left
ligamentum flavum hypertrophy resulting in severe left-sided spinal
canal stenosis with mass effect on the left dorsal cord.
2. Progressive ligamentum flavum hypertrophy at C3-C4 with moderate
spinal canal stenosis, flattening of the cord, and new small focus
of myelomalacia in the left hemicord.
3. Unchanged moderate spinal canal stenosis at C4-C5.

Thoracic spine:

1. Mild multilevel thoracic spondylosis as described above. No more
than mild stenosis from T9-T10 through T11-T12.

Lumbar spine:

1. Unchanged severe spinal canal stenosis and moderate bilateral
neuroforaminal stenosis at L1-L2 and L2-L3.
2. Unchanged moderate spinal canal stenosis and severe left
neuroforaminal stenosis at T12-L1.
3. Prior L4-S1 fusion with extensive susceptibility artifact
limiting evaluation at these levels.

## 2021-04-27 ENCOUNTER — Other Ambulatory Visit: Payer: Self-pay | Admitting: Family Medicine

## 2021-04-27 ENCOUNTER — Ambulatory Visit
Admission: RE | Admit: 2021-04-27 | Discharge: 2021-04-27 | Disposition: A | Discharge status: Home / Self Care | Payer: Medicare (Managed Care) | Source: Ambulatory Visit | Attending: Family Medicine | Admitting: Family Medicine

## 2021-04-27 DIAGNOSIS — W19XXXA Unspecified fall, initial encounter: Secondary | ICD-10-CM

## 2021-06-19 ENCOUNTER — Emergency Department (HOSPITAL_COMMUNITY): Payer: Medicare (Managed Care)

## 2021-06-19 ENCOUNTER — Encounter (HOSPITAL_COMMUNITY): Payer: Self-pay

## 2021-06-19 ENCOUNTER — Other Ambulatory Visit: Payer: Self-pay

## 2021-06-19 ENCOUNTER — Emergency Department (HOSPITAL_COMMUNITY)
Admission: EM | Admit: 2021-06-19 | Discharge: 2021-06-19 | Disposition: A | Discharge status: Home / Self Care | Payer: Medicare (Managed Care) | Attending: Emergency Medicine | Admitting: Emergency Medicine

## 2021-06-19 DIAGNOSIS — M545 Low back pain, unspecified: Secondary | ICD-10-CM | POA: Diagnosis not present

## 2021-06-19 DIAGNOSIS — M79604 Pain in right leg: Secondary | ICD-10-CM | POA: Diagnosis not present

## 2021-06-19 DIAGNOSIS — F0390 Unspecified dementia without behavioral disturbance: Secondary | ICD-10-CM | POA: Insufficient documentation

## 2021-06-19 DIAGNOSIS — Z131 Encounter for screening for diabetes mellitus: Secondary | ICD-10-CM | POA: Diagnosis not present

## 2021-06-19 DIAGNOSIS — R52 Pain, unspecified: Secondary | ICD-10-CM

## 2021-06-19 DIAGNOSIS — G8929 Other chronic pain: Secondary | ICD-10-CM

## 2021-06-19 LAB — CBC WITH DIFFERENTIAL/PLATELET
Abs Immature Granulocytes: 0.01 10*3/uL (ref 0.00–0.07)
Basophils Absolute: 0 10*3/uL (ref 0.0–0.1)
Basophils Relative: 1 %
Eosinophils Absolute: 0.3 10*3/uL (ref 0.0–0.5)
Eosinophils Relative: 7 %
HCT: 31.4 % — ABNORMAL LOW (ref 36.0–46.0)
Hemoglobin: 9.9 g/dL — ABNORMAL LOW (ref 12.0–15.0)
Immature Granulocytes: 0 %
Lymphocytes Relative: 41 %
Lymphs Abs: 1.8 10*3/uL (ref 0.7–4.0)
MCH: 27.5 pg (ref 26.0–34.0)
MCHC: 31.5 g/dL (ref 30.0–36.0)
MCV: 87.2 fL (ref 80.0–100.0)
Monocytes Absolute: 0.4 10*3/uL (ref 0.1–1.0)
Monocytes Relative: 9 %
Neutro Abs: 1.8 10*3/uL (ref 1.7–7.7)
Neutrophils Relative %: 42 %
Platelets: 160 10*3/uL (ref 150–400)
RBC: 3.6 MIL/uL — ABNORMAL LOW (ref 3.87–5.11)
RDW: 13.8 % (ref 11.5–15.5)
WBC: 4.3 10*3/uL (ref 4.0–10.5)
nRBC: 0 % (ref 0.0–0.2)

## 2021-06-19 LAB — BASIC METABOLIC PANEL
Anion gap: 4 — ABNORMAL LOW (ref 5–15)
BUN: 33 mg/dL — ABNORMAL HIGH (ref 8–23)
CO2: 25 mmol/L (ref 22–32)
Calcium: 9 mg/dL (ref 8.9–10.3)
Chloride: 107 mmol/L (ref 98–111)
Creatinine, Ser: 1.03 mg/dL — ABNORMAL HIGH (ref 0.44–1.00)
GFR, Estimated: 55 mL/min — ABNORMAL LOW (ref >60)
Glucose, Bld: 182 mg/dL — ABNORMAL HIGH (ref 70–99)
Potassium: 4.4 mmol/L (ref 3.5–5.1)
Sodium: 136 mmol/L (ref 135–145)

## 2021-06-19 LAB — CBG MONITORING, ED: Glucose-Capillary: 173 mg/dL — ABNORMAL HIGH (ref 70–99)

## 2021-06-19 MED ORDER — LISINOPRIL 10 MG PO TABS: 40.0000 mg | ORAL_TABLET | Freq: Once | ORAL | Status: DC

## 2021-06-19 MED ORDER — SODIUM CHLORIDE 0.9 % IV BOLUS
500.0000 mL | Freq: Once | INTRAVENOUS | Status: AC
  Administered 2021-06-19: 500 mL via INTRAVENOUS

## 2021-06-19 MED ORDER — DULOXETINE HCL 30 MG PO CPEP: 60.0000 mg | ORAL_CAPSULE | Freq: Every day | ORAL | Status: DC

## 2021-06-19 MED ORDER — OXYCODONE HCL 5 MG PO TABS: 5.0000 mg | ORAL_TABLET | ORAL | Status: DC | PRN

## 2021-06-19 MED ORDER — ACETAMINOPHEN 325 MG PO TABS: 650.0000 mg | ORAL_TABLET | ORAL | Status: DC | PRN

## 2021-06-19 MED ORDER — ONDANSETRON HCL 4 MG/2ML IJ SOLN
4.0000 mg | Freq: Once | INTRAMUSCULAR | Status: AC | PRN
  Administered 2021-06-19: 4 mg via INTRAVENOUS
  Filled 2021-06-19: qty 2

## 2021-06-19 MED ORDER — MORPHINE SULFATE (PF) 4 MG/ML IV SOLN
4.0000 mg | Freq: Once | INTRAVENOUS | Status: AC
  Administered 2021-06-19: 4 mg via INTRAVENOUS
  Filled 2021-06-19: qty 1

## 2021-06-19 MED ORDER — FENTANYL CITRATE PF 50 MCG/ML IJ SOSY
25.0000 ug | PREFILLED_SYRINGE | Freq: Once | INTRAMUSCULAR | Status: AC | PRN
Start: 2021-06-19 — End: 2021-06-19
  Administered 2021-06-19: 25 ug via INTRAVENOUS
  Filled 2021-06-19: qty 1

## 2021-06-19 NOTE — ED Notes (Signed)
Pt ambulated w/ walker w/ ED RN as standby assistance.  Pt able to stand from sitting position w/out need for assistance w/ walker.  Pt able to walk around ed room w/ walker w/out need for further assistance.  Pt taken to restroom w/ sarasteady.  Pt able to use restroom w/out assistance. ED MD Cornelia notified and aware.

## 2021-06-19 NOTE — ED Notes (Signed)
Patient transported to MRI via stretcher w/ MRI tech. 

## 2021-06-19 NOTE — ED Notes (Signed)
Pt's sister, Corrie Dandy, called and notified pt will be returning home.  Corrie Dandy states she will be home to receive pt w/ [facility].

## 2021-06-19 NOTE — ED Notes (Signed)
Pt states understanding of dc instructions, importance of follow up. Pt denies questions or concerns upon dc. Pt assisted into wheelchair and wheeled out to front lobby to [facility] driver.  Pt assisted into wheelchair assessable bus by [facility] driver.  [facility] driver given TRX Med Necess and facesheet.  No belongings left in room upon dc.

## 2021-06-19 NOTE — Discharge Instructions (Addendum)
Important:  You do not have broken bones in your back on your CT scans today.  You have some pinched nerves in your spine on MRI.  For pain you can continue taking your home Percocet pain medication every 6 hours as prescribed.  Call your doctors office to talk about your pain control.  We placed a consult with our social worker to see if we can help with additional home health or physical therapy at home.  You will likely continue to have worsening pain over the next few months or years, and are at high risk for falls at home.  Eventually you may not be able to walk, and may require constant care.  I would strongly recommend that you eat, your sister, and your family talk to your doctor about a plan for your care when you reach this point.  This may involve a nursing home, full-time home care, or moving in with other family.  The emergency department is not a good place to come for placement into a nursing facility.

## 2021-06-19 NOTE — TOC Transition Note (Signed)
Transition of Care [facility]) - CM/SW Discharge Note   Patient Details  Name: Sydney Gilbert MRN: [id number] Date of Birth: Apr 05, 1941  Transition of Care [facility]) CM/SW Contact:  Lavenia Atlas, RN Phone Number: 06/19/2021, 1:44 PM   Clinical Narrative:    Patient has history of spinal stenosis, chronic back pain and falls. Patient seen in the ED today for falls. ED RNCM received TOC consult for [facility] services. RNCM spoke with [facility] with [facility] who is in the process of setting up [facility] services for the patient. No other additional TOC needs at this time.   Patient Goals and CMS Choice    Go home with [facility] services    Discharge Placement   Discharge Plan and Services  Home with [facility] services   Social Determinants of Health (SDOH) Interventions     Readmission Risk Interventions No flowsheet data found.

## 2021-06-19 NOTE — ED Notes (Signed)
Pt returned to department via stretcher w/ MRI tech.  Pt vitally stable and in NAD upon return.

## 2021-06-19 NOTE — ED Provider Notes (Signed)
Sydney Gilbert Provider Note   CSN: [id number] Arrival date & time: 06/19/21  0930     History  Chief Complaint  Patient presents with   Back Pain    Sydney Gilbert is a 81 y.o. female with history of chronic back pain, chronic cervical pain, L4-S1 fusion, presenting to ED with mechanical fall approximate 1 week ago, complaining of worsening pain in her lower back.  She says she is having pain in her right leg as well.  She has limited mobility at home, but is able to ambulate a little bit with her walker.  She lives at home with her sister.  She is prescribed oxycodone 2.5 mg tablets which she has been taking, including this morning prior to arrival.  Is not helping her pain enough.  She describes pain that radiates from her back down her right leg towards her toes.  MR Spine Jan 2020, impression:   IMPRESSION: Cervical spine:   1. 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 2. Progressive ligamentum flavum hypertrophy at C3-C4 with moderate spinal canal stenosis, flattening of the cord, and new small focus of myelomalacia in the left hemicord. 3. Unchanged moderate spinal canal stenosis at C4-C5.   Thoracic spine:   1. Mild multilevel thoracic spondylosis as described above. No more than mild stenosis from T9-T10 through T11-T12.   Lumbar spine:   1. Unchanged severe spinal canal stenosis and moderate bilateral neuroforaminal stenosis at L1-L2 and L2-L3. 2. Unchanged moderate spinal canal stenosis and severe left neuroforaminal stenosis at T12-L1. 3. Prior L4-S1 fusion with extensive susceptibility artifact limiting evaluation at these levels.    HPI     Home Medications Prior to Admission medications   Medication Sig Start Date End Date Taking? Authorizing Provider  acetaminophen  (TYLENOL) 650 MG CR tablet Take 650 mg by mouth in the morning, at noon, and at bedtime.    [provider]  amLODipine (NORVASC) 10 MG tablet Take 10 mg by mouth daily.    [provider]  Baclofen 5 MG TABS Take 1 tablet by mouth in the morning and at bedtime.    [provider]  Cholecalciferol (VITAMIN D3) 50 MCG (2000 UT) capsule Take 2,000 Units by mouth daily.    [provider]  DULoxetine (CYMBALTA) 60 MG capsule Take 60 mg by mouth 2 (two) times daily. (DO NOT CRUSH)     [provider]  famotidine (PEPCID) 20 MG tablet Take 20 mg by mouth 2 (two) times daily. AS NEEDED FOR HEARTBURN    [provider]  ferrous gluconate (FERGON) 324 MG tablet Take 324 mg by mouth daily with breakfast.    [provider]  hydrocortisone 2.5 % cream Apply 1 application topically 2 (two) times daily as needed (For itcing,rash on face).    [provider]  lidocaine (LIDODERM) 5 % Place 1 patch onto the skin daily. Apply over the left middle back where your rib is broken. Remove & Discard patch within 12 hours or as directed by MD 01/03/20   Arthor CaptainHarris, Abigail, PA-C  [facility](SALONPAS ARTHRITIS PAIN RELIEF EX) Apply 1 application topically 2 (two) times daily as needed (For pain management).    [provider]  lisinopril (ZESTRIL) 40 MG tablet Take 40 mg by mouth daily. FOR HTN    [provider]  Menthol, Topical Analgesic, (BIOFREEZE EX)  Apply 1 application topically 2 (two) times daily as needed (For aches and pain).    [provider]  metoprolol succinate (TOPROL-XL) 25 MG 24 hr tablet Take 25 mg by mouth See admin instructions. Take 2.5 tablets (total 62.6mg ) orally once a day (Evening/night) for blood pressure    [provider]  oxycodone-acetaminophen (PERCOCET) 2.5-325 MG tablet Take 1 tablet by mouth in the morning and at bedtime.    [provider]  polyethylene glycol (MIRALAX /  GLYCOLAX) 17 g packet Take 17 g by mouth daily as needed for mild constipation.    [provider]  polyvinyl alcohol (LIQUIFILM TEARS) 1.4 % ophthalmic solution Place 1 drop into both eyes in the morning, at noon, in the evening, and at bedtime.    [provider]  senna-docusate (SENOKOT-S) 8.6-50 MG tablet Take 2 tablets by mouth at bedtime.     [provider]  simvastatin (ZOCOR) 10 MG tablet Take 10 mg by mouth every evening.  11/16/18   [provider]  Vitamins A & D (VITAMIN A & D) ointment Apply topically 3 (three) times daily. Patient not taking: Reported on 01/03/2020 09/06/19   Alwyn Ren, MD      Allergies    Metformin and related, Penicillins, and Varenicline    Review of Systems   Review of Systems  Physical Exam Updated Vital Signs BP (!) 132/58    Pulse 81    Temp 98.5 F (36.9 C) (Oral)    Resp 18    Ht 5\' 7"  (1.702 m)    Wt 68 kg    SpO2 96%    BMI 23.49 kg/m  Physical Exam Constitutional:      Comments: Thin frail, chronically ill-appearing  HENT:     Head: Normocephalic and atraumatic.  Eyes:     Conjunctiva/sclera: Conjunctivae normal.     Pupils: Pupils are equal, round, and reactive to light.  Cardiovascular:     Rate and Rhythm: Normal rate and regular rhythm.     Pulses: Normal pulses.  Pulmonary:     Effort: Pulmonary effort is normal. No respiratory distress.  Musculoskeletal:     Comments: Midline L-spine and C-spine tenderness, bilateral paraspinal tenderness of the lower back, diffuse tenderness of the hips.  Patient is able to actively and passively range both legs at the hips with minimal pain.  Skin:    General: Skin is warm and dry.  Neurological:     Mental Status: She is alert. Mental status is at baseline.     Comments: 4 out of 5 strength to plantar flexion and extension on the right leg compared to the left.  Patient reports paresthesias in the right foot and lower leg.  Psychiatric:        Mood  and Affect: Mood normal.        Behavior: Behavior normal.    ED Results / Procedures / Treatments   Labs (all labs ordered are listed, but only abnormal results are displayed) Labs Reviewed  BASIC METABOLIC PANEL - Abnormal; Notable for the following components:      Result Value   Glucose, Bld 182 (*)    BUN 33 (*)    Creatinine, Ser 1.03 (*)    GFR, Estimated 55 (*)    Anion gap 4 (*)    All other components within normal limits  CBC WITH DIFFERENTIAL/PLATELET - Abnormal; Notable for the following components:   RBC 3.60 (*)    Hemoglobin 9.9 (*)  HCT 31.4 (*)    All other components within normal limits  CBG MONITORING, ED - Abnormal; Notable for the following components:   Glucose-Capillary 173 (*)    All other components within normal limits    EKG None  Radiology DG Pelvis 1-2 Views  Result Date: 06/19/2021 CLINICAL DATA:  Chronic low back pain. Increasing pain after falling last week. EXAM: PELVIS - 1-2 VIEW COMPARISON:  Radiographs 10/24/2018. FINDINGS: Two views are submitted. Postsurgical changes related to previous lumbosacral fusion are grossly stable. The hardware is intact. There is no evidence of acute fracture or dislocation. There are stable mild degenerative changes involving the hips and sacroiliac joints bilaterally. No focal soft tissue abnormalities are identified. IMPRESSION: No evidence of acute pelvic fracture or dislocation. Stable degenerative changes. Electronically Signed   By: Carey Bullocks M.D.   On: 06/19/2021 10:43   CT Cervical Spine Wo Contrast  Result Date: 06/19/2021 CLINICAL DATA:  Larey Seat last week. EXAM: CT CERVICAL SPINE WITHOUT CONTRAST TECHNIQUE: Multidetector CT imaging of the cervical spine was performed without intravenous contrast. Multiplanar CT image reconstructions were also generated. RADIATION DOSE REDUCTION: This exam was performed according to the departmental dose-optimization program which includes automated exposure  control, adjustment of the mA and/or kV according to patient size and/or use of iterative reconstruction technique. COMPARISON:  CT cervical spine dated January 03, 2020. FINDINGS: Alignment: Normal. Skull base and vertebrae: No acute fracture. No primary bone lesion or focal pathologic process. Soft tissues and spinal canal: No prevertebral fluid or swelling. No visible canal hematoma. Disc levels: Disc heights are relatively preserved. Prior C3-C6 posterior decompression and C2-T1 posterior fusion. Unchanged lucency around the bilateral T1 pedicle screws. Upper chest: Negative. Other: None. IMPRESSION: 1. No acute cervical spine fracture or traumatic listhesis. 2. Prior C3-C6 posterior decompression and C2-T1 posterior fusion with unchanged loosening of the bilateral T1 pedicle screws. Electronically Signed   By: Obie Dredge M.D.   On: 06/19/2021 10:36   CT Lumbar Spine Wo Contrast  Result Date: 06/19/2021 CLINICAL DATA:  Acute on chronic low back pain after fall last week. EXAM: CT LUMBAR SPINE WITHOUT CONTRAST TECHNIQUE: Multidetector CT imaging of the lumbar spine was performed without intravenous contrast administration. Multiplanar CT image reconstructions were also generated. RADIATION DOSE REDUCTION: This exam was performed according to the departmental dose-optimization program which includes automated exposure control, adjustment of the mA and/or kV according to patient size and/or use of iterative reconstruction technique. COMPARISON:  MRI lumbar spine dated Oct 24, 2018. FINDINGS: Segmentation: 5 lumbar type vertebrae. Alignment: Unchanged mild reversal of the normal cervical lordosis. Unchanged mild anterolisthesis at T12-L1. Vertebrae: No acute fracture or focal pathologic process. Prior L4-S1 posterior fusion. No evidence of hardware failure or loosening. Paraspinal and other soft tissues: Aortoiliac atherosclerotic vascular disease. Disc levels: T12-L1: Similar circumferential disc osteophyte  complex and bilateral facet arthropathy resulting in moderate spinal canal and mild bilateral stenosis. L1-L2: Similar circumferential disc osteophyte complex and bilateral facet arthropathy resulting in severe spinal canal and severe left and moderate right bilateral neuroforaminal stenosis. L2-L3: Similar circumferential disc osteophyte complex and bilateral facet arthropathy resulting in severe spinal canal and moderate bilateral neuroforaminal stenosis. L3-L4: Prior left hemilaminectomy. No significant disc bulge or herniation. No stenosis. L4-L5:  Prior posterior fusion.  No residual stenosis. L5-S1:  Prior posterior fusion.  No residual stenosis. IMPRESSION: 1. No acute osseous abnormality. 2. Prior L4-S1 posterior fusion without evidence of hardware complication or residual stenosis. 3. 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 4. Aortic Atherosclerosis (ICD10-I70.0). Electronically Signed   By: Obie Dredge M.D.   On: 06/19/2021 10:44   MR LUMBAR SPINE WO CONTRAST  Result Date: 06/19/2021 CLINICAL DATA:  Low back pain, cauda equina syndrome suspected weakness plantar flexion right ankle after fall last week EXAM: MRI LUMBAR SPINE WITHOUT CONTRAST TECHNIQUE: Multiplanar, multisequence MR imaging of the lumbar spine was performed. No intravenous contrast was administered. Metal artifact reduction protocol utilized. COMPARISON:  CT 06/19/2021, MRI 10/24/2018 FINDINGS: Segmentation:  Standard. Alignment: Smooth reversal of the lumbar lordosis. Unchanged grade 1 anterolisthesis of T12 on L1. Vertebrae: Postsurgical changes from prior L4-S1 posterior fusion with associated susceptibility artifact. No evidence of acute fracture or discitis. Discogenic endplate marrow changes within the non-fused levels. No suspicious bone lesion identified. Conus medullaris and cauda equina: Conus extends to the T12 level.  Bunching of the cauda equina nerve roots above the L1-2 and L2-3 levels of stenosis. Paraspinal and other soft tissues: Posterior paraspinal muscle atrophy. Disc levels: T12-L1: Anterolisthesis with disc bulge. Bilateral facet arthropathy and ligamentum flavum buckling. Findings contribute to moderate to severe canal stenosis with moderate to severe bilateral foraminal stenosis, left worse than right. No appreciable interval change. L1-L2: Disc bulge with facet arthropathy and ligamentum flavum buckling contributes to severe canal stenosis with moderate bilateral foraminal stenosis. No appreciable interval change. L2-L3: Disc bulge with bilateral facet arthropathy and ligamentum flavum buckling contributing to severe canal stenosis with moderate to severe right and moderate left foraminal stenosis. No appreciable interval change. L3-L4: No evidence of foraminal or canal stenosis. L4-L5: Prior fusion.  No evidence of foraminal or canal stenosis. L5-S1: Prior fusion.  No evidence of foraminal or canal stenosis. IMPRESSION: 1. 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 Moderate-to-severe canal stenosis at T12-L1. No significant interval progression from prior MRI. 2. High-grade bilateral foraminal stenosis from T12-L1 to L2-L3. 3. Postsurgical changes from prior L4-S1 posterior fusion. No evidence of foraminal or canal stenosis at the operative levels. Electronically Signed   By: Duanne Guess D.O.   On: 06/19/2021 12:18    Procedures Procedures    Medications Ordered in ED Medications  morphine 4 MG/ML injection 4 mg (4 mg Intravenous Given 06/19/21 1004)  sodium chloride 0.9 % bolus 500 mL (0 mLs Intravenous Stopped 06/19/21 1122)  fentaNYL (SUBLIMAZE) injection 25 mcg (25 mcg Intravenous Given 06/19/21 1121)  ondansetron (ZOFRAN) injection 4 mg (4 mg Intravenous Given 06/19/21 1119)    ED Course/ Medical Decision Making/ A&P Clinical Course as of 06/19/21 1303  Fri Jun 19, 2021  1056 Pain improved  with morphine [MT]  1227 MRI does show severe canal stenosis, which upon review of prior MRI appears similar.  We will attempt to ambulate the patient to see if she is able to walk with a walker.  I did speak with her sister on the phone who confirms that at baseline the patient is able to use the walker and transfer herself.  The 2 of them live independently together in the house.  The patient does have family in [place] in [place] but no immediate family in the area otherwise.  They would be interested in additional home health of this is available. [MT]  1256 Patient was able to ambulate with a walker in the ED.  Will arrange repeat R transportation home.  I strongly encouraged her sister and the rest of her family to have a discussion  regarding her increasingly limited mobility and frequent falls, she will likely end up needing placement soon in a nursing facility. [MT]    Clinical Course User Index [MT] Yekaterina Escutia, Kermit Balo, MD                           Medical Decision Making  This patient presents to the ED with concern for chronic pain, fall a week ago.  This involves an extensive number of treatment options, and is a complaint that carries with it a high risk of complications and morbidity.  The differential diagnosis includes Spinal fracture versus worsening myelopathy versus radiculopathy versus other  Co-morbidities that complicate the patient evaluation: Age, frailty  Additional history obtained from paramedics upon arrival  External records from outside source obtained and reviewed including MRI scan Jan 2020  I ordered and personally interpreted labs.  The pertinent results include: Minor increase of BUN and creatinine, does not qualify as AKI.  I ordered imaging studies including CT imaging of the C and L-spine, x-ray of the hip, MRI of the lumbar spine I independently visualized and interpreted imaging which showed no acute fracture, chronic degenerative disc disease with some  severe spinal stenosis of the L-spine, noted on prior imaging. I agree with the radiologist interpretation  I ordered medication including IV pain medications, IV fluids, for pain and hydration I have reviewed the patients home medicines and have made adjustments as needed   After the interventions noted above, I reevaluated the patient and found that they have: improved  Social Determinants of Health:age, dementia; patient need for transportation home; transition of care and social work team consulted regarding home health  Disposition:  After consideration of the diagnostic results and the patients response to treatment, I feel that the patent would benefit from transportation home with additional home health resources, family needs to have a discussion with provider clinics about plan moving forward, as I suspect the patient's condition will only progress to limited or no mobility in the future.  She does appear to have some dorsiflexion weakness of her ankles, but upon neurosurgical evaluation in 2018, this was also noted at that time.  The fact that she is able to ambulate and bear weight which is her baseline indicates that I doubt this is an acute cauda equina syndrome.  I would avoid prednisone or steroids given her age and comorbidities.   Clinical Course as of 06/19/21 1303  Fri Jun 19, 2021  1056 Pain improved with morphine [MT]  1227 MRI does show severe canal stenosis, which upon review of prior MRI appears similar.  We will attempt to ambulate the patient to see if she is able to walk with a walker.  I did speak with her sister on the phone who confirms that at baseline the patient is able to use the walker and transfer herself.  The 2 of them live independently together in the house.  The patient does have family in [place] in [place] but no immediate family in the area otherwise.  They would be interested in additional home health of this is available. [MT]  1256 Patient was able  to ambulate with a walker in the ED.  Will arrange repeat R transportation home.  I strongly encouraged her sister and the rest of her family to have a discussion regarding her increasingly limited mobility and frequent falls, she will likely end up needing placement soon in a nursing facility. [MT]  Clinical Course User Index [MT] Yizel Canby, Kermit BaloMatthew J, MD            Final Clinical Impression(s) / ED Diagnoses Final diagnoses:  Chronic bilateral low back pain without sciatica    Rx / DC Orders ED Discharge Orders     None         Terald Sleeperrifan, Hana Trippett J, MD 06/19/21 1303

## 2021-06-19 NOTE — ED Triage Notes (Signed)
Pt bib ems for lower back pain.  Pt hx of chronic back pain but states she fell last week and pain has increased. Pt had xr's scheduled but unable to go d/t pain.  Pt states right leg pain as well. Pt states mobility w/ walker at home. Pt lives w/ sister.

## 2021-06-19 NOTE — ED Notes (Signed)
[facility] dispatch called and pt's transport arranged.

## 2022-01-06 ENCOUNTER — Observation Stay (HOSPITAL_COMMUNITY): Payer: Medicare (Managed Care)

## 2022-01-06 ENCOUNTER — Emergency Department (HOSPITAL_COMMUNITY): Payer: Medicare (Managed Care)

## 2022-01-06 ENCOUNTER — Other Ambulatory Visit: Payer: Self-pay

## 2022-01-06 ENCOUNTER — Observation Stay (HOSPITAL_COMMUNITY)
Admission: EM | Admit: 2022-01-06 | Discharge: 2022-01-08 | Disposition: A | Discharge status: Skilled Nursing Facility | Payer: Medicare (Managed Care) | Attending: Internal Medicine | Admitting: Internal Medicine

## 2022-01-06 ENCOUNTER — Encounter (HOSPITAL_COMMUNITY): Payer: Self-pay | Admitting: *Deleted

## 2022-01-06 DIAGNOSIS — R4701 Aphasia: Secondary | ICD-10-CM | POA: Diagnosis present

## 2022-01-06 DIAGNOSIS — I10 Essential (primary) hypertension: Secondary | ICD-10-CM | POA: Diagnosis not present

## 2022-01-06 DIAGNOSIS — Z20822 Contact with and (suspected) exposure to covid-19: Secondary | ICD-10-CM | POA: Insufficient documentation

## 2022-01-06 DIAGNOSIS — Z79899 Other long term (current) drug therapy: Secondary | ICD-10-CM | POA: Diagnosis not present

## 2022-01-06 DIAGNOSIS — R404 Transient alteration of awareness: Secondary | ICD-10-CM | POA: Diagnosis not present

## 2022-01-06 DIAGNOSIS — E119 Type 2 diabetes mellitus without complications: Secondary | ICD-10-CM | POA: Diagnosis present

## 2022-01-06 DIAGNOSIS — F32A Depression, unspecified: Secondary | ICD-10-CM | POA: Diagnosis present

## 2022-01-06 DIAGNOSIS — R4182 Altered mental status, unspecified: Secondary | ICD-10-CM

## 2022-01-06 DIAGNOSIS — R5383 Other fatigue: Secondary | ICD-10-CM

## 2022-01-06 DIAGNOSIS — E1159 Type 2 diabetes mellitus with other circulatory complications: Secondary | ICD-10-CM | POA: Diagnosis present

## 2022-01-06 DIAGNOSIS — G9340 Encephalopathy, unspecified: Secondary | ICD-10-CM | POA: Diagnosis not present

## 2022-01-06 DIAGNOSIS — E785 Hyperlipidemia, unspecified: Secondary | ICD-10-CM | POA: Diagnosis present

## 2022-01-06 DIAGNOSIS — I152 Hypertension secondary to endocrine disorders: Secondary | ICD-10-CM | POA: Diagnosis present

## 2022-01-06 DIAGNOSIS — F17210 Nicotine dependence, cigarettes, uncomplicated: Secondary | ICD-10-CM | POA: Insufficient documentation

## 2022-01-06 DIAGNOSIS — M6281 Muscle weakness (generalized): Secondary | ICD-10-CM | POA: Insufficient documentation

## 2022-01-06 DIAGNOSIS — E1165 Type 2 diabetes mellitus with hyperglycemia: Secondary | ICD-10-CM | POA: Diagnosis not present

## 2022-01-06 DIAGNOSIS — Z7984 Long term (current) use of oral hypoglycemic drugs: Secondary | ICD-10-CM | POA: Insufficient documentation

## 2022-01-06 DIAGNOSIS — R531 Weakness: Secondary | ICD-10-CM

## 2022-01-06 DIAGNOSIS — N179 Acute kidney failure, unspecified: Secondary | ICD-10-CM | POA: Diagnosis not present

## 2022-01-06 LAB — VITAMIN B12: Vitamin B-12: 482 pg/mL (ref 180–914)

## 2022-01-06 LAB — CBC
HCT: 32.5 % — ABNORMAL LOW (ref 36.0–46.0)
Hemoglobin: 10.2 g/dL — ABNORMAL LOW (ref 12.0–15.0)
MCH: 27.2 pg (ref 26.0–34.0)
MCHC: 31.4 g/dL (ref 30.0–36.0)
MCV: 86.7 fL (ref 80.0–100.0)
Platelets: 201 10*3/uL (ref 150–400)
RBC: 3.75 MIL/uL — ABNORMAL LOW (ref 3.87–5.11)
RDW: 14.9 % (ref 11.5–15.5)
WBC: 7.3 10*3/uL (ref 4.0–10.5)
nRBC: 0 % (ref 0.0–0.2)

## 2022-01-06 LAB — COMPREHENSIVE METABOLIC PANEL
ALT: 16 U/L (ref 0–44)
AST: 35 U/L (ref 15–41)
Albumin: 3.9 g/dL (ref 3.5–5.0)
Alkaline Phosphatase: 45 U/L (ref 38–126)
Anion gap: 13 (ref 5–15)
BUN: 23 mg/dL (ref 8–23)
CO2: 20 mmol/L — ABNORMAL LOW (ref 22–32)
Calcium: 9.7 mg/dL (ref 8.9–10.3)
Chloride: 108 mmol/L (ref 98–111)
Creatinine, Ser: 1.46 mg/dL — ABNORMAL HIGH (ref 0.44–1.00)
GFR, Estimated: 36 mL/min — ABNORMAL LOW (ref >60)
Glucose, Bld: 124 mg/dL — ABNORMAL HIGH (ref 70–99)
Potassium: 4 mmol/L (ref 3.5–5.1)
Sodium: 141 mmol/L (ref 135–145)
Total Bilirubin: 0.5 mg/dL (ref 0.3–1.2)
Total Protein: 7.3 g/dL (ref 6.5–8.1)

## 2022-01-06 LAB — I-STAT VENOUS BLOOD GAS, ED
Acid-base deficit: 3 mmol/L — ABNORMAL HIGH (ref 0.0–2.0)
Bicarbonate: 21.9 mmol/L (ref 20.0–28.0)
Calcium, Ion: 1.18 mmol/L (ref 1.15–1.40)
HCT: 33 % — ABNORMAL LOW (ref 36.0–46.0)
Hemoglobin: 11.2 g/dL — ABNORMAL LOW (ref 12.0–15.0)
O2 Saturation: 80 %
Potassium: 4 mmol/L (ref 3.5–5.1)
Sodium: 141 mmol/L (ref 135–145)
TCO2: 23 mmol/L (ref 22–32)
pCO2, Ven: 36.3 mmHg — ABNORMAL LOW (ref 44–60)
pH, Ven: 7.39 (ref 7.25–7.43)
pO2, Ven: 44 mmHg (ref 32–45)

## 2022-01-06 LAB — I-STAT CHEM 8, ED
BUN: 24 mg/dL — ABNORMAL HIGH (ref 8–23)
Calcium, Ion: 1.19 mmol/L (ref 1.15–1.40)
Chloride: 109 mmol/L (ref 98–111)
Creatinine, Ser: 1.4 mg/dL — ABNORMAL HIGH (ref 0.44–1.00)
Glucose, Bld: 119 mg/dL — ABNORMAL HIGH (ref 70–99)
HCT: 34 % — ABNORMAL LOW (ref 36.0–46.0)
Hemoglobin: 11.6 g/dL — ABNORMAL LOW (ref 12.0–15.0)
Potassium: 4 mmol/L (ref 3.5–5.1)
Sodium: 141 mmol/L (ref 135–145)
TCO2: 21 mmol/L — ABNORMAL LOW (ref 22–32)

## 2022-01-06 LAB — RAPID URINE DRUG SCREEN, HOSP PERFORMED
Amphetamines: NOT DETECTED
Barbiturates: NOT DETECTED
Benzodiazepines: NOT DETECTED
Cocaine: NOT DETECTED
Opiates: NOT DETECTED
Tetrahydrocannabinol: NOT DETECTED

## 2022-01-06 LAB — APTT: aPTT: 23 seconds — ABNORMAL LOW (ref 24–36)

## 2022-01-06 LAB — URINALYSIS, ROUTINE W REFLEX MICROSCOPIC
Bilirubin Urine: NEGATIVE
Glucose, UA: NEGATIVE mg/dL
Hgb urine dipstick: NEGATIVE
Ketones, ur: NEGATIVE mg/dL
Leukocytes,Ua: NEGATIVE
Nitrite: NEGATIVE
Protein, ur: NEGATIVE mg/dL
Specific Gravity, Urine: 1.015 (ref 1.005–1.030)
pH: 6 (ref 5.0–8.0)

## 2022-01-06 LAB — ETHANOL: Alcohol, Ethyl (B): <10 mg/dL (ref <10)

## 2022-01-06 LAB — TSH: TSH: 1.075 u[IU]/mL (ref 0.350–4.500)

## 2022-01-06 LAB — DIFFERENTIAL
Abs Immature Granulocytes: 0.01 10*3/uL (ref 0.00–0.07)
Basophils Absolute: 0.1 10*3/uL (ref 0.0–0.1)
Basophils Relative: 1 %
Eosinophils Absolute: 0.5 10*3/uL (ref 0.0–0.5)
Eosinophils Relative: 7 %
Immature Granulocytes: 0 %
Lymphocytes Relative: 27 %
Lymphs Abs: 2 10*3/uL (ref 0.7–4.0)
Monocytes Absolute: 0.4 10*3/uL (ref 0.1–1.0)
Monocytes Relative: 6 %
Neutro Abs: 4.4 10*3/uL (ref 1.7–7.7)
Neutrophils Relative %: 59 %

## 2022-01-06 LAB — RESP PANEL BY RT-PCR (FLU A&B, COVID) ARPGX2
Influenza A by PCR: NEGATIVE
Influenza B by PCR: NEGATIVE
SARS Coronavirus 2 by RT PCR: NEGATIVE

## 2022-01-06 LAB — PROTIME-INR
INR: 1 (ref 0.8–1.2)
Prothrombin Time: 12.7 seconds (ref 11.4–15.2)

## 2022-01-06 LAB — CBG MONITORING, ED
Glucose-Capillary: 118 mg/dL — ABNORMAL HIGH (ref 70–99)
Glucose-Capillary: 143 mg/dL — ABNORMAL HIGH (ref 70–99)

## 2022-01-06 LAB — AMMONIA: Ammonia: 19 umol/L (ref 9–35)

## 2022-01-06 MED ORDER — THIAMINE HCL 100 MG PO TABS
100.0000 mg | ORAL_TABLET | Freq: Every day | ORAL | Status: DC
  Administered 2022-01-08: 100 mg via ORAL
  Filled 2022-01-06 (×2): qty 1

## 2022-01-06 MED ORDER — IOHEXOL 350 MG/ML SOLN
100.0000 mL | Freq: Once | INTRAVENOUS | Status: AC | PRN
  Administered 2022-01-06: 100 mL via INTRAVENOUS

## 2022-01-06 MED ORDER — HEPARIN SODIUM (PORCINE) 5000 UNIT/ML IJ SOLN
5000.0000 [IU] | Freq: Three times a day (TID) | INTRAMUSCULAR | Status: DC
Start: 2022-01-06 — End: 2022-01-08
  Administered 2022-01-07 – 2022-01-08 (×6): 5000 [IU] via SUBCUTANEOUS
  Filled 2022-01-06 (×6): qty 1

## 2022-01-06 MED ORDER — ACETAMINOPHEN 160 MG/5ML PO SOLN: 650.0000 mg | ORAL | Status: DC | PRN

## 2022-01-06 MED ORDER — DULOXETINE HCL 60 MG PO CPEP
60.0000 mg | ORAL_CAPSULE | Freq: Every day | ORAL | Status: DC
  Administered 2022-01-07 – 2022-01-08 (×2): 60 mg via ORAL
  Filled 2022-01-06: qty 1
  Filled 2022-01-06: qty 2

## 2022-01-06 MED ORDER — LABETALOL HCL 5 MG/ML IV SOLN
INTRAVENOUS | Status: AC
  Filled 2022-01-06: qty 4

## 2022-01-06 MED ORDER — SODIUM CHLORIDE 0.9 % IV BOLUS
500.0000 mL | Freq: Once | INTRAVENOUS | Status: AC
  Administered 2022-01-06: 500 mL via INTRAVENOUS

## 2022-01-06 MED ORDER — LABETALOL HCL 5 MG/ML IV SOLN
10.0000 mg | INTRAVENOUS | Status: DC | PRN
Start: 2022-01-06 — End: 2022-01-08

## 2022-01-06 MED ORDER — SODIUM CHLORIDE 0.9 % IV SOLN: INTRAVENOUS | Status: DC

## 2022-01-06 MED ORDER — INSULIN ASPART 100 UNIT/ML IJ SOLN
0.0000 [IU] | INTRAMUSCULAR | Status: DC
  Administered 2022-01-06: 1 [IU] via SUBCUTANEOUS
  Administered 2022-01-07: 3 [IU] via SUBCUTANEOUS
  Administered 2022-01-08 (×2): 1 [IU] via SUBCUTANEOUS

## 2022-01-06 MED ORDER — SENNOSIDES-DOCUSATE SODIUM 8.6-50 MG PO TABS: 1.0000 | ORAL_TABLET | Freq: Every evening | ORAL | Status: DC | PRN

## 2022-01-06 MED ORDER — SIMVASTATIN 20 MG PO TABS
10.0000 mg | ORAL_TABLET | Freq: Every evening | ORAL | Status: DC
  Administered 2022-01-07: 10 mg via ORAL
  Filled 2022-01-06: qty 1

## 2022-01-06 MED ORDER — THIAMINE HCL 100 MG/ML IJ SOLN
100.0000 mg | Freq: Every day | INTRAMUSCULAR | Status: DC
  Administered 2022-01-06 – 2022-01-07 (×2): 100 mg via INTRAVENOUS
  Filled 2022-01-06 (×2): qty 2

## 2022-01-06 MED ORDER — ASPIRIN 300 MG RE SUPP: 300.0000 mg | Freq: Every day | RECTAL | Status: DC

## 2022-01-06 MED ORDER — ACETAMINOPHEN 650 MG RE SUPP: 650.0000 mg | RECTAL | Status: DC | PRN

## 2022-01-06 MED ORDER — ASPIRIN 81 MG PO CHEW
81.0000 mg | CHEWABLE_TABLET | Freq: Every day | ORAL | Status: DC
  Administered 2022-01-06 – 2022-01-08 (×3): 81 mg via ORAL
  Filled 2022-01-06 (×3): qty 1

## 2022-01-06 MED ORDER — STROKE: EARLY STAGES OF RECOVERY BOOK
Freq: Once | Status: AC
  Filled 2022-01-06: qty 1

## 2022-01-06 MED ORDER — ACETAMINOPHEN 325 MG PO TABS
650.0000 mg | ORAL_TABLET | ORAL | Status: DC | PRN
  Administered 2022-01-08: 650 mg via ORAL
  Filled 2022-01-06: qty 2

## 2022-01-06 NOTE — ED Notes (Signed)
Neuro MD at BS

## 2022-01-06 NOTE — ED Notes (Signed)
Into CT

## 2022-01-06 NOTE — Assessment & Plan Note (Signed)
Holding home antihypertensives to allow permissive hypertension for now.

## 2022-01-06 NOTE — Assessment & Plan Note (Signed)
Hold home meds, place on sensitive SSI while NPO.

## 2022-01-06 NOTE — ED Notes (Signed)
Back to trauma room A from CT

## 2022-01-06 NOTE — Assessment & Plan Note (Signed)
-   Resume home simvastatin 

## 2022-01-06 NOTE — H&P (Signed)
History and Physical    Sydney Gilbert ZOX:[id number] DOB: 01/11/1941 DOA: 01/06/2022  PCP: [facility]  Patient coming from: [place]  I have personally briefly reviewed patient's old medical records in [facility]  Chief Complaint: Altered mental status  HPI: Sydney Gilbert is a 81 y.o. female with medical history significant for T2DM, HTN, HLD, anemia, GERD, depression, mild cognitive impairment who [facility] of altered mental status.  History is supplemented by ED, chart review, and sister by phone.  Patient was at her adult day program with patient to try it.  When she got there around 1030-11 AM she was not feeling well.  She states that she was nauseous and threw up.  When staff checked on her at approximately 2:30 PM they noticed that she was less responsive and confused.  EMS were called and on their arrival she was not speaking and appeared to have left-sided neglect.  She was given 0.4 mg Narcan in the facility and 1 mg Narcan by EMS without change in symptoms.  She subsequently brought to the ED for further evaluation.  On initial arrival she was aphasic, confused, and appeared to have bilateral lower extremity weakness.  On admitting exam, patient is somnolent but awakens to briefly answer questions.  She states that she is here because she was throwing up.  She knows that she is in the hospital, can tell me her sister's name and that she lives with her sister.  She states that the year is 56.  She denies any chest pain, dyspnea, abdominal pain, dysuria.  ED Course  Labs/Imaging on admission: I have personally reviewed following labs and imaging studies.  Initial vitals showed BP 169/69, pulse 76, RR 10, temp 97.4 F, SPO2 99% on room air.  Labs show WBC 7.3, hemoglobin 10.2, platelets 201,000, sodium 141, potassium 4.0, bicarb 20, BUN 23, creatinine 1.46, serum glucose 124, LFTs within normal  limits, TSH 1.075, B12 482, ammonia 19.  Urinalysis negative for UTI.  UDS negative.  SARS-CoV-2 and influenza PCR negative.  CT head without contrast showed chronic small vessel ischemic changes of the pons and hemispheric white matter.  Question small area loss of gray-white differentiation at the deep insula on the left.  CTA head/neck with perfusion study negative for emergent LVO, no infarct core or penumbra.  Calcified plaque bilateral carotid bifurcations noted without significant stenosis or occlusion.  Patient was given 500 cc normal saline.  Neurology were consulted and recommended admission for further work-up.  Patient was started on IV thiamine, ASA 300 mg PR daily.  The hospitalist service was consulted to admit for further evaluation and management.  Review of Systems:  All systems reviewed and are negative except as documented in history of present illness above.   Past Medical History:  Diagnosis Date   Anemia    Arthritis    Depression    Diabetes mellitus without complication (HCC)    GERD (gastroesophageal reflux disease)    Hypertension    Spinal stenosis     History reviewed. No pertinent surgical history.  Social History:  reports that she has been smoking cigarettes. She has never used smokeless tobacco. She reports that she does not currently use alcohol. She reports that she does not use drugs.  Allergies  Allergen Reactions   Metformin And Related    Penicillins Rash    Has patient had a PCN reaction causing immediate rash,  facial/tongue/throat swelling, SOB or lightheadedness with hypotension: Yes Has patient had a PCN reaction causing severe rash involving mucus membranes or skin necrosis: No Has patient had a PCN reaction that required hospitalization: NO Has patient had a PCN reaction occurring within the last 10 years: No If all of the above answers are "NO", then may proceed with Cephalosporin use.    Varenicline Itching    Family History   Family history unknown: Yes     Prior to Admission medications   Medication Sig Start Date End Date Taking? Authorizing Provider  acetaminophen (TYLENOL) 325 MG tablet Take 650 mg by mouth See admin instructions. During [facility] days   Yes [provider]  acetaminophen (TYLENOL) 500 MG tablet Take 1,000 mg by mouth 2 (two) times daily as needed for moderate pain.   Yes [provider]  Colloidal Oatmeal (EUCERIN ECZEMA RELIEF EX) Apply 1 Application topically 2 (two) times daily.   Yes [provider]  cycloSPORINE (RESTASIS MULTIDOSE) 0.05 % ophthalmic emulsion Place 1 drop into both eyes every 12 (twelve) hours. Dry eyes   Yes [provider]  glipiZIDE (GLUCOTROL XL) 2.5 MG 24 hr tablet Take 2.5 mg by mouth daily with breakfast.   Yes [provider]  hydrochlorothiazide (HYDRODIURIL) 25 MG tablet Take 25 mg by mouth daily.   Yes [provider]  hydroquinone 4 % cream Apply 1 Application topically 2 (two) times daily.   Yes [provider]  meloxicam (MOBIC) 15 MG tablet Take 15 mg by mouth daily.   Yes [provider]  Propylene Glycol (SYSTANE COMPLETE OP) Place 1 drop into both eyes in the morning, at noon, in the evening, and at bedtime.   Yes [provider]  pyrithione zinc (HEAD AND SHOULDERS) 1 % shampoo Apply topically See admin instructions. Use as directed   Yes [provider]  simethicone (MYLICON) 125 MG chewable tablet Chew 125 mg by mouth 3 (three) times daily as needed (gas pains).   Yes [provider]  triamcinolone cream (KENALOG) 0.1 % Apply 1 Application topically daily as needed (rash).   Yes [provider]  amLODipine (NORVASC) 10 MG tablet Take 10 mg by mouth daily.    [provider]  baclofen (LIORESAL) 20 MG tablet Take 1 tablet by mouth in the morning and at bedtime.    [provider]  Cholecalciferol (VITAMIN D3) 50 MCG (2000 UT)  capsule Take 2,000 Units by mouth daily.    [provider]  DULoxetine (CYMBALTA) 60 MG capsule Take 60 mg by mouth daily. (DO NOT CRUSH)    [provider]  famotidine (PEPCID) 20 MG tablet Take 20 mg by mouth 2 (two) times daily. AS NEEDED FOR HEARTBURN    [provider]  ferrous gluconate (FERGON) 324 MG tablet Take 324 mg by mouth daily with breakfast.    [provider]  hydrocortisone 2.5 % cream Apply 1 application topically 2 (two) times daily as needed (For itcing,rash on face).    [provider]  lidocaine (LIDODERM) 5 % Place 1 patch onto the skin daily. Apply over the left middle back where your rib is broken. Remove & Discard patch within 12 hours or as directed by MD 01/03/20   Arthor Captain, PA-C  [facility] ARTHRITIS PAIN RELIEF EX) Apply 1 application topically 2 (two) times daily as needed (For pain management).    [provider]  lisinopril (ZESTRIL) 40 MG tablet Take 40 mg  by mouth daily. FOR HTN    [provider]  Menthol, Topical Analgesic, (BIOFREEZE EX) Apply 1 application topically 2 (two) times daily as needed (For aches and pain).    [provider]  metoprolol succinate (TOPROL-XL) 100 MG 24 hr tablet Take 100 mg by mouth daily.    [provider]  oxycodone-acetaminophen (PERCOCET) 2.5-325 MG tablet Take 1 tablet by mouth in the morning and at bedtime.    [provider]  polyethylene glycol (MIRALAX / GLYCOLAX) 17 g packet Take 17 g by mouth daily as needed for mild constipation.    [provider]  polyvinyl alcohol (LIQUIFILM TEARS) 1.4 % ophthalmic solution Place 1 drop into both eyes in the morning, at noon, in the evening, and at bedtime.    [provider]  senna-docusate (SENOKOT-S) 8.6-50 MG tablet Take 2 tablets by mouth at bedtime.     [provider]  simvastatin (ZOCOR) 10 MG tablet Take 10 mg by mouth every evening.  11/16/18    [provider]  Vitamins A & D (VITAMIN A & D) ointment Apply topically 3 (three) times daily. Patient not taking: Reported on 01/03/2020 09/06/19   Alwyn Ren, MD    Physical Exam: Vitals:   01/06/22 2045 01/06/22 2100 01/06/22 2140 01/06/22 2300  BP: (!) 180/75 (!) 191/75 (!) 165/76 (!) 175/71  Pulse: 82 78 81 72  Resp:  16 19 16   Temp:      TempSrc:      SpO2: 100% 100% 100% 100%  Weight:      Exam somewhat limited by cooperation Constitutional: Resting supine in bed, appears fatigued but in NAD, calm Eyes: PERRL, EOMI, lids and conjunctivae normal ENMT: Mucous membranes are moist. Posterior pharynx clear of any exudate or lesions.Normal dentition.  Neck: normal, supple, no masses. Respiratory: clear to auscultation anteriorly. Normal respiratory effort. No accessory muscle use.  Cardiovascular: Regular rate and rhythm, no murmurs / rubs / gallops. No extremity edema. 2+ pedal pulses. Abdomen: no tenderness, no masses palpated. Musculoskeletal: no clubbing / cyanosis. No joint deformity upper and lower extremities. Good ROM, no contractures. Normal muscle tone.  Skin: no rashes, lesions, ulcers. No induration Neurologic: Slight right facial droop otherwise CN 2-12 grossly intact. Sensation intact. Strength largely equal and 5/5 in all extremities however slightly diminished on the left Psychiatric: Alert and oriented, to self, place, states the year is 1923  EKG: Personally reviewed. Sinus rhythm, borderline prolonged PR interval, no acute ischemic changes.  No prior for comparison.  Assessment/Plan Principal Problem:   Acute encephalopathy Active Problems:   AKI (acute kidney injury) (HCC)   Hypertension associated with diabetes (HCC)   Type 2 diabetes mellitus with hyperglycemia (HCC)   HLD (hyperlipidemia)   Depression   Sydney Gilbert is a 81 y.o. female with medical history significant for T2DM, HTN, HLD, anemia, GERD, depression, mild cognitive  impairment who is admitted with acute encephalopathy.  Assessment and Plan: * Acute encephalopathy Presenting with aphasia, possible left-sided neglect, confusion.  Seen by neurology who recommended further evaluation for CVA, infectious, or metabolic etiology.  Had reported nausea and vomiting prior to arrival.  Has mild AKI. -CT head with small area loss of gray-white differentiation at deep insula on the left -CTA head/neck/perfusion study negative for LVO, infarct or, or penumbra -MRI brain pending -Obtain KUB -Routine EEG -UA negative for UTI -Continue neurochecks, telemetry -PT/OT/SLP eval  AKI (acute kidney injury) (HCC) Creatinine 1.46 on admission compared to baseline  1.03. -Start on IV NS@100  mL/hour overnight -Hold HCTZ, lisinopril  Hypertension associated with diabetes (HCC) Holding home antihypertensives to allow permissive hypertension for now.  Type 2 diabetes mellitus with hyperglycemia (HCC) Hold home meds, place on sensitive SSI while NPO.  Depression Continue Cymbalta.  HLD (hyperlipidemia) Resume home simvastatin.  DVT prophylaxis: heparin injection 5,000 Units Start: 01/06/22 2200 Code Status: Full code, discussed with sister by phone Family Communication: Discussed with patient's sister by phone Disposition Plan: From home, dispo pending clinical progress Consults called: Neurology Severity of Illness: The appropriate patient status for this patient is OBSERVATION. Observation status is judged to be reasonable and necessary in order to provide the required intensity of service to ensure the patient's safety. The patient's presenting symptoms, physical exam findings, and initial radiographic and laboratory data in the context of their medical condition is felt to place them at decreased risk for further clinical deterioration. Furthermore, it is anticipated that the patient will be medically stable for discharge from the hospital within 2 midnights of  admission.   Darreld Mclean MD [facility]  If 7PM-7AM, please contact night-coverage [email or web address]  01/06/2022, 11:22 PM

## 2022-01-06 NOTE — ED Notes (Signed)
Scan in progress

## 2022-01-06 NOTE — ED Notes (Signed)
Pt returned from MRI °

## 2022-01-06 NOTE — ED Provider Notes (Addendum)
[facility]   CSN: [id number] Arrival date & time: 01/06/22  1520     History  Chief Complaint  Patient presents with   Altered Mental Status    Sydney Gilbert is a 81 y.o. female.  Presenting to the ER due to concern for altered mental status.  History was obtained initially from EMS report, additional history was obtained from brother as well as from Dr. Modena JanskyKohler at [facility] over the phone.  Her brother reports that he was not with patient today, she lives mostly independently, goes to adult program [facility].  She lives with her elderly sister.  According to staff at [facility], this morning when patient got there around 1030 or 11 AM she was not feeling well, feeling somewhat nauseated and lie down.  When staff checked on her at approximately 2:30 PM,.  They noticed she was decreased responsiveness.,  Confused.  EMS stated patient not speaking, may have had left-sided neglect.  GCS per EMS fluctuating 8-10, maintaining airway. Received 0.4mg  narcan by facility and 1mg  narcan by ems with no change in symptoms.  Patient unable to provide hx.      Home Medications Prior to Admission medications   Medication Sig Start Date End Date Taking? Authorizing Provider  acetaminophen (TYLENOL) 650 MG CR tablet Take 650 mg by mouth in the morning, at noon, and at bedtime.    [provider]  amLODipine (NORVASC) 10 MG tablet Take 10 mg by mouth daily.    [provider]  Baclofen 5 MG TABS Take 1 tablet by mouth in the morning and at bedtime.    [provider]  Cholecalciferol (VITAMIN D3) 50 MCG (2000 UT) capsule Take 2,000 Units by mouth daily.    [provider]  DULoxetine (CYMBALTA) 60 MG capsule Take 60 mg by mouth 2 (two) times daily. (DO NOT CRUSH)     [provider]  famotidine (PEPCID) 20 MG tablet Take 20 mg by mouth 2 (two) times daily. AS NEEDED FOR HEARTBURN     [provider]  ferrous gluconate (FERGON) 324 MG tablet Take 324 mg by mouth daily with breakfast.    [provider]  hydrocortisone 2.5 % cream Apply 1 application topically 2 (two) times daily as needed (For itcing,rash on face).    [provider]  lidocaine (LIDODERM) 5 % Place 1 patch onto the skin daily. Apply over the left middle back where your rib is broken. Remove & Discard patch within 12 hours or as directed by MD 01/03/20   Arthor CaptainHarris, Abigail, PA-C  [facility](SALONPAS ARTHRITIS PAIN RELIEF EX) Apply 1 application topically 2 (two) times daily as needed (For pain management).    [provider]  lisinopril (ZESTRIL) 40 MG tablet Take 40 mg by mouth daily. FOR HTN    [provider]  Menthol, Topical Analgesic, (BIOFREEZE EX) Apply 1 application topically 2 (two) times daily as needed (For aches and pain).    [provider]  metoprolol succinate (TOPROL-XL) 25 MG 24 hr tablet Take 25 mg by mouth See admin instructions. Take 2.5 tablets (total 62.6mg ) orally once a day (Evening/night) for blood pressure    [provider]  oxycodone-acetaminophen (PERCOCET) 2.5-325 MG tablet Take 1 tablet by mouth in the morning and at bedtime.    [provider]  polyethylene glycol (MIRALAX / GLYCOLAX) 17 g packet Take 17 g by mouth daily as needed  for mild constipation.    [provider]  polyvinyl alcohol (LIQUIFILM TEARS) 1.4 % ophthalmic solution Place 1 drop into both eyes in the morning, at noon, in the evening, and at bedtime.    [provider]  senna-docusate (SENOKOT-S) 8.6-50 MG tablet Take 2 tablets by mouth at bedtime.     [provider]  simvastatin (ZOCOR) 10 MG tablet Take 10 mg by mouth every evening.  11/16/18   [provider]  Vitamins A & D (VITAMIN A & D) ointment Apply topically 3 (three) times daily. Patient not taking: Reported on 01/03/2020 09/06/19   Alwyn Ren, MD       Allergies    Metformin and related, Penicillins, and Varenicline    Review of Systems   Review of Systems  Unable to perform ROS: Mental status change    Physical Exam Updated Vital Signs BP (!) 184/71   Pulse 80   Resp (!) 24   Wt 70.6 kg   SpO2 99%   BMI 24.38 kg/m  Physical Exam Vitals and nursing note reviewed.  Constitutional:      Appearance: She is well-developed.     Comments: Eyes open, not responding to voice or following commands, lethargic  HENT:     Head: Normocephalic and atraumatic.  Eyes:     Conjunctiva/sclera: Conjunctivae normal.  Cardiovascular:     Rate and Rhythm: Normal rate and regular rhythm.     Heart sounds: No murmur heard. Pulmonary:     Effort: Pulmonary effort is normal. No respiratory distress.     Breath sounds: Normal breath sounds.  Abdominal:     Palpations: Abdomen is soft.     Tenderness: There is no abdominal tenderness.  Musculoskeletal:        General: No swelling.     Cervical back: Neck supple.  Skin:    General: Skin is warm and dry.     Capillary Refill: Capillary refill takes less than 2 seconds.  Neurological:     Comments: Seems somewhat alert but lethargic, eyes are opening spontaneously, seems to have slight right gaze preference, initially no spontaneous movements in her extremities but was localizing pain on the right side, decreased response to pain on the left; repeat exam a few minutes later patient was having spontaneous movement in her extremities and making some incomprehensible sounds  Psychiatric:        Mood and Affect: Mood normal.     ED Results / Procedures / Treatments   Labs (all labs ordered are listed, but only abnormal results are displayed) Labs Reviewed  APTT - Abnormal; Notable for the following components:      Result Value   aPTT 23 (*)    All other components within normal limits  CBC - Abnormal; Notable for the following components:   RBC 3.75 (*)    Hemoglobin 10.2 (*)     HCT 32.5 (*)    All other components within normal limits  COMPREHENSIVE METABOLIC PANEL - Abnormal; Notable for the following components:   CO2 20 (*)    Glucose, Bld 124 (*)    Creatinine, Ser 1.46 (*)    GFR, Estimated 36 (*)    All other components within normal limits  URINALYSIS, ROUTINE W REFLEX MICROSCOPIC - Abnormal; Notable for the following components:   Color, Urine COLORLESS (*)    All other components within normal limits  I-STAT CHEM 8, ED - Abnormal; Notable for the following components:   BUN  24 (*)    Creatinine, Ser 1.40 (*)    Glucose, Bld 119 (*)    TCO2 21 (*)    Hemoglobin 11.6 (*)    HCT 34.0 (*)    All other components within normal limits  I-STAT VENOUS BLOOD GAS, ED - Abnormal; Notable for the following components:   pCO2, Ven 36.3 (*)    Acid-base deficit 3.0 (*)    HCT 33.0 (*)    Hemoglobin 11.2 (*)    All other components within normal limits  CBG MONITORING, ED - Abnormal; Notable for the following components:   Glucose-Capillary 118 (*)    All other components within normal limits  RESP PANEL BY RT-PCR (FLU A&B, COVID) ARPGX2  ETHANOL  PROTIME-INR  DIFFERENTIAL  RAPID URINE DRUG SCREEN, HOSP PERFORMED  AMMONIA    EKG EKG Interpretation  Date/Time:  Wednesday January 06 2022 15:49:03 EDT Ventricular Rate:  87 PR Interval:  213 QRS Duration: 96 QT Interval:  370 QTC Calculation: 446 R Axis:   33 Text Interpretation: Sinus rhythm Borderline prolonged PR interval LAE, consider biatrial enlargement Baseline wander in lead(s) V3 Confirmed by Marianna Fuss ([id number]) on 01/06/2022 3:56:24 PM  Radiology CT ANGIO HEAD NECK W WO CM W PERF (CODE STROKE)  Result Date: 01/06/2022 CLINICAL DATA:  Left-sided weakness and possible right-sided gaze preference. EXAM: CT ANGIOGRAPHY HEAD AND NECK CT PERFUSION BRAIN TECHNIQUE: Multidetector CT imaging of the head and neck was performed using the standard protocol during bolus administration of intravenous  contrast. Multiplanar CT image reconstructions and MIPs were obtained to evaluate the vascular anatomy. Carotid stenosis measurements (when applicable) are obtained utilizing NASCET criteria, using the distal internal carotid diameter as the denominator. Multiphase CT imaging of the brain was performed following IV bolus contrast injection. Subsequent parametric perfusion maps were calculated using RAPID software. RADIATION DOSE REDUCTION: This exam was performed according to the departmental dose-optimization program which includes automated exposure control, adjustment of the mA and/or kV according to patient size and/or use of iterative reconstruction technique. CONTRAST:  OMNIPAQUE IOHEXOL 350 MG/ML SOLN COMPARISON:  Same-day noncontrast CT head. Soft tissue neck CT 03/02/2013 FINDINGS: CTA NECK FINDINGS Aortic arch: The imaged aortic arch is normal. The origin of the brachiocephalic artery is not included within the field of view. The origin of the left subclavian artery is patent with mild stenosis due to calcified plaque. The subclavian arteries are otherwise patent to the level imaged. Right carotid system: The right common carotid artery is patent with scattered calcified plaque resulting in less than 50% stenosis. There is calcified plaque at the bifurcation resulting in less than 50% stenosis. The distal right internal carotid artery is patent. The right external carotid artery is patent with mild-to-moderate stenosis proximally. There is no evidence of dissection or aneurysm. Left carotid system: The left common carotid artery is patent with scattered calcified plaque resulting in less than 50% stenosis. There is minimal plaque at the bifurcation without significant stenosis. The distal left internal carotid artery is widely patent. The left external carotid artery is patent. There is no dissection or aneurysm. Vertebral arteries: The left vertebral artery is dominant, a normal variant. A portion  of the distal right V2 segment is suboptimally evaluated due to streak artifact from cervical spine hardware. Within this confine, the vertebral arteries are patent, without hemodynamically significant stenosis or occlusion. There is no dissection or aneurysm. Skeleton: There is no acute osseous abnormality or suspicious osseous lesion. Postsurgical changes reflecting posterior instrumented fusion  from C2 through T1 are noted. There is no evidence of hardware related complication. There is no visible canal hematoma. Other neck: The soft tissues of the neck are unremarkable. Upper chest: The imaged lung apices are clear. Review of the MIP images confirms the above findings CTA HEAD FINDINGS Anterior circulation: The intracranial ICAs are patent with mild calcified plaque but no hemodynamically significant stenosis or occlusion. The bilateral MCAs are patent. The bilateral ACAs are patent. The anterior communicating artery is normal. There is no aneurysm or AVM. Posterior circulation: The right V4 segment is hypoplastic/absent after the PICA origin, likely a developmental variant and similar in appearance to the prior CT neck from 2014. The dominant left V4 segment is patent with focal calcified plaque resulting in mild-to-moderate stenosis. The basilar artery is patent. The major cerebellar arteries appear patent. The bilateral PCAs are patent with mild irregularity and narrowing of the left P2 segment. Bilateral posterior communicating arteries are identified. There is no aneurysm or AVM. Venous sinuses: Patent. Anatomic variants: As above. Review of the MIP images confirms the above findings CT Brain Perfusion Findings: CBF (<30%) Volume: 41mL Perfusion (Tmax>6.0s) volume: 63mL Mismatch Volume: 87mL Infarction Location:N/a IMPRESSION: 1. No emergent large vessel occlusion. 2. No infarct core or penumbra identified on CT perfusion. 3. Calcified plaque of the bilateral carotid bifurcations right worse than left without  hemodynamically significant stenosis or occlusion. Patent vertebral arteries. 4. Mild intracranial atherosclerotic disease with no proximal high-grade stenosis or occlusion. Electronically Signed   By: Lesia Hausen M.D.   On: 01/06/2022 16:04   CT HEAD CODE STROKE WO CONTRAST  Result Date: 01/06/2022 CLINICAL DATA:  Code stroke. Neuro deficit, acute, stroke suspected. EXAM: CT HEAD WITHOUT CONTRAST TECHNIQUE: Contiguous axial images were obtained from the base of the skull through the vertex without intravenous contrast. RADIATION DOSE REDUCTION: This exam was performed according to the departmental dose-optimization program which includes automated exposure control, adjustment of the mA and/or kV according to patient size and/or use of iterative reconstruction technique. COMPARISON:  01/03/2020 FINDINGS: Brain: Chronic small-vessel ischemic change affects the pons. No focal cerebellar insult. Chronic small-vessel ischemic changes affect the cerebral hemispheric white matter. Question mild loss of gray-white differentiation at the deep insula on the left. This is not definite. No evidence of hemorrhage, hydrocephalus or extra-axial collection. Vascular: There is atherosclerotic calcification of the major vessels at the base of the brain. Skull: Negative Sinuses/Orbits: Clear/normal Other: None ASPECTS (Alberta Stroke Program Early CT Score) - Ganglionic level infarction (caudate, lentiform nuclei, internal capsule, insula, M1-M3 cortex): 6 - Supraganglionic infarction (M4-M6 cortex): 3 Total score (0-10 with 10 being normal): 9 IMPRESSION: 1. 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 Question small area loss of gray-white differentiation at the deep insula on the left, not definite. 2. ASPECTS is 9, possibly. 3. These results were communicated to Dr. Selina Cooley at 3:37 pm on 01/06/2022 by text page via the [facility] messaging system. Electronically Signed   By: Paulina Fusi M.D.   On:  01/06/2022 15:39    Procedures Procedures    Medications Ordered in ED Medications  labetalol (NORMODYNE) 5 MG/ML injection (has no administration in time range)  sodium chloride 0.9 % bolus 500 mL (has no administration in time range)  iohexol (OMNIPAQUE) 350 MG/ML injection 100 mL (100 mLs Intravenous Contrast Given 01/06/22 1543)    ED Course/ Medical Decision Making/ A&P  Medical Decision Making Amount and/or Complexity of Data Reviewed Labs: ordered. Radiology: ordered.  Risk Decision regarding hospitalization.   81 year old lady presenting to ER due to concern for altered mental status.  Staff report generalized confusion, seems to be acute change from her baseline.  Unclear exact last known well as patient seem to be having some symptoms albeit more mild this morning when she went to the facility.  Her symptoms seem to be improving soon after arrival here.  Due to her decrease in GCS and possible right sided gaze preference, decreased responsiveness to the left side, initiated code stroke.  Neurology evaluated patient.  After we obtained additional history, patient determined not to be TNK/tPA candidate due to outside window, improving symptoms.  Lab work reviewed, mild elevation in creatinine from baseline concern for mild AKI.  CT head with questionable small area of gray-white differentiation but Dr. Selina Cooley felt this was unlikely to represent acute stroke.  She recommended metabolic work-up, MRI if no clear etiology identified.  Patient's urinalysis is negative.  Her mental status does seem to be improving but she is still generally confused.  Able to answer some basic questions now.  Will admit to medicine for further management, MRI and observation.  Have provided some fluids for the suspected AKI.  Discussed case again with Selina Cooley - given negative metabolic workup thus far she advises to plan to continue stroke workup and do EEG tmrw as well, rec admit  medicine.         Final Clinical Impression(s) / ED Diagnoses Final diagnoses:  Altered mental status, unspecified altered mental status type  Acute kidney injury [facility])    Rx / DC Orders ED Discharge Orders     None         Milagros Loll, MD 01/06/22 1714    Milagros Loll, MD 01/06/22 [phone]

## 2022-01-06 NOTE — ED Notes (Signed)
Lab confirms can add TSH, B1, B12, RPR

## 2022-01-06 NOTE — ED Notes (Signed)
Neuro MD and neuro / stroke team present

## 2022-01-06 NOTE — Assessment & Plan Note (Signed)
Continue Cymbalta.

## 2022-01-06 NOTE — ED Notes (Signed)
Pt currently in CT. EDP spoke with family via phone.

## 2022-01-06 NOTE — Assessment & Plan Note (Addendum)
Presenting with aphasia, possible left-sided neglect, confusion.  Seen by neurology who recommended further evaluation for CVA, infectious, or metabolic etiology.  Had reported nausea and vomiting prior to arrival.  Has mild AKI. -CT head with small area loss of gray-white differentiation at deep insula on the left -CTA head/neck/perfusion study negative for LVO, infarct or, or penumbra -MRI brain pending -Obtain KUB -Routine EEG -UA negative for UTI -Continue neurochecks, telemetry -PT/OT/SLP eval

## 2022-01-06 NOTE — Code Documentation (Signed)
Stroke Response Nurse Documentation Code Documentation  Sydney Gilbert is a 81 y.o. female arriving to [facility]  via [facility] on 01/06/22 with past medical hx of DM, HTN. On No antithrombotic. Code stroke was activated by ED.   Patient from [facility]  where she was LKW at 1030. MD there reports she was already lethargic then at 1030 because increasingly altered and EMS was called. During examination here it was noticed she had some left sided weakness and aphasia so Code Stroke was called.   Patient to CT with team. NIHSS 17, see documentation for details and code stroke times. Patient with disoriented, bilateral arm weakness, bilateral leg weakness, Global aphasia , and dysarthria  on exam. The following imaging was completed:  CT Head, CTA, and CTP. Patient is not a candidate for IV Thrombolytic due to undetermined LKW. Patient is not a candidate for IR due to no suspected LVO, per physician.   Care Plan: Q2 NIHSS and VS.   Bedside handoff with ED RN Tomasa Rand.    Toniann Fail  Stroke Response RN

## 2022-01-06 NOTE — ED Notes (Signed)
To CT

## 2022-01-06 NOTE — ED Notes (Signed)
Patient transported to MRI 

## 2022-01-06 NOTE — Consult Note (Signed)
NEUROLOGY CONSULTATION NOTE   Date of service: January 06, 2022 Patient Name: Sydney Gilbert MRN:  [id number] DOB:  1941/02/25 Reason for consult: stroke code Requesting physician: Dr. Stevie Kern _ _ _   _ __   _ __ _ _  __ __   _ __   __ _  History of Present Illness   This is an 81 year old woman with past medical history significant for mild cognitive impairment, diabetes, hypertension who was [facility] for altered mental status.  Patient arrived at the facility at approximately 1030 and at that time she was not her usual self, she was lethargic, and felt nauseated.  She laid down and became increasingly lethargic.  She is on oxycodone for chronic pain and staff became concerned she might take too much so they gave her dose of Narcan to which she had little if any response.  She continued to become increasingly altered at which time the facility called EMS.  On arrival to the ED she had diffuse weakness in all extremities and was initially mute and then only able to say her first name.  She has significant difficulty following commands and required repeated stimulation to attend.  CT was remarkable for possible acute left insular stroke within aspects of 9 (personal review).  Patient was outside the window for TNK.  CTA showed no LVO. CTP neg.   ROS   UTA 2/2 aphasia/AMS  Past History   I have reviewed the following:  Past Medical History:  Diagnosis Date   Anemia    Arthritis    Depression    Diabetes mellitus without complication (HCC)    GERD (gastroesophageal reflux disease)    Hypertension    Spinal stenosis    No past surgical history on file. Family History  Family history unknown: Yes   Social History   Socioeconomic History   Marital status: Divorced    Spouse name: Not on file   Number of children: Not on file   Years of education: Not on file   Highest education level: Not on file  Occupational History   Not on file  Tobacco Use    Smoking status: Every Day    Types: Cigarettes   Smokeless tobacco: Never  Substance and Sexual Activity   Alcohol use: Not Currently   Drug use: Never   Sexual activity: Not Currently  Other Topics Concern   Not on file  Social History Narrative   Not on file   Social Determinants of Health   Financial Resource Strain: Not on file  Food Insecurity: Not on file  Transportation Needs: Not on file  Physical Activity: Not on file  Stress: Not on file  Social Connections: Not on file   Allergies  Allergen Reactions   Metformin And Related    Penicillins Rash    Has patient had a PCN reaction causing immediate rash, facial/tongue/throat swelling, SOB or lightheadedness with hypotension: Yes Has patient had a PCN reaction causing severe rash involving mucus membranes or skin necrosis: No Has patient had a PCN reaction that required hospitalization: NO Has patient had a PCN reaction occurring within the last 10 years: No If all of the above answers are "NO", then may proceed with Cephalosporin use.    Varenicline Itching    Medications   (Not in a hospital admission)    No current facility-administered medications for this encounter.  Current Outpatient Medications:    acetaminophen (TYLENOL) 650 MG CR  tablet, Take 650 mg by mouth in the morning, at noon, and at bedtime., Disp: , Rfl:    amLODipine (NORVASC) 10 MG tablet, Take 10 mg by mouth daily., Disp: , Rfl:    Baclofen 5 MG TABS, Take 1 tablet by mouth in the morning and at bedtime., Disp: , Rfl:    Cholecalciferol (VITAMIN D3) 50 MCG (2000 UT) capsule, Take 2,000 Units by mouth daily., Disp: , Rfl:    DULoxetine (CYMBALTA) 60 MG capsule, Take 60 mg by mouth 2 (two) times daily. (DO NOT CRUSH) , Disp: , Rfl:    famotidine (PEPCID) 20 MG tablet, Take 20 mg by mouth 2 (two) times daily. AS NEEDED FOR HEARTBURN, Disp: , Rfl:    ferrous gluconate (FERGON) 324 MG tablet, Take 324 mg by mouth daily with breakfast., Disp: ,  Rfl:    hydrocortisone 2.5 % cream, Apply 1 application topically 2 (two) times daily as needed (For itcing,rash on face)., Disp: , Rfl:    lidocaine (LIDODERM) 5 %, Place 1 patch onto the skin daily. Apply over the left middle back where your rib is broken. Remove & Discard patch within 12 hours or as directed by MD, Disp: 30 patch, Rfl: 0   Liniments (SALONPAS ARTHRITIS PAIN RELIEF EX), Apply 1 application topically 2 (two) times daily as needed (For pain management)., Disp: , Rfl:    lisinopril (ZESTRIL) 40 MG tablet, Take 40 mg by mouth daily. FOR HTN, Disp: , Rfl:    Menthol, Topical Analgesic, (BIOFREEZE EX), Apply 1 application topically 2 (two) times daily as needed (For aches and pain)., Disp: , Rfl:    metoprolol succinate (TOPROL-XL) 25 MG 24 hr tablet, Take 25 mg by mouth See admin instructions. Take 2.5 tablets (total 62.6mg ) orally once a day (Evening/night) for blood pressure, Disp: , Rfl:    oxycodone-acetaminophen (PERCOCET) 2.5-325 MG tablet, Take 1 tablet by mouth in the morning and at bedtime., Disp: , Rfl:    polyethylene glycol (MIRALAX / GLYCOLAX) 17 g packet, Take 17 g by mouth daily as needed for mild constipation., Disp: , Rfl:    polyvinyl alcohol (LIQUIFILM TEARS) 1.4 % ophthalmic solution, Place 1 drop into both eyes in the morning, at noon, in the evening, and at bedtime., Disp: , Rfl:    senna-docusate (SENOKOT-S) 8.6-50 MG tablet, Take 2 tablets by mouth at bedtime. , Disp: , Rfl:    simvastatin (ZOCOR) 10 MG tablet, Take 10 mg by mouth every evening. , Disp: , Rfl:    Vitamins A & D (VITAMIN A & D) ointment, Apply topically 3 (three) times daily. (Patient not taking: Reported on 01/03/2020), Disp: 45 g, Rfl: 0  Vitals   Vitals:   01/06/22 1500  Weight: 70.6 kg     Body mass index is 24.38 kg/m.  Physical Exam   Physical Exam Gen: lethargic, oriented to first name only, can follow some but not most simple commands Resp: CTAB, no w/r/r CV: RRR, no m/g/r; nml  S1 and S2. 2+ symmetric peripheral pulses.  Neuro: *MS: lethargic, oriented to first name only, can follow some but not most simple commands *Speech: moderate dysarthria, unable to name or repeat *CN: PERRL, blinks to threat bilat, EOMI, face symmetric at rest, hearing intact to voice *Motor: anti-gravity with drift to bed in all extremities, symmetric *Sensory: SILT *Coordination, gait: UTA *Reflexes: 1+ symm throughout, toes mute   NIHSS  1a Level of Conscious.: 1 1b LOC Questions: 2 1c LOC Commands: 2 2 Best  Gaze: 0 3 Visual: 0 4 Facial Palsy: 0 5a Motor Arm - left: 2 5b Motor Arm - Right: 2 6a Motor Leg - Left: 2 6b Motor Leg - Right: 2 7 Limb Ataxia: 0 8 Sensory: 0 9 Best Language: 2 10 Dysarthria: 2 11 Extinct. and Inatten.: 0  TOTAL: 17   Premorbid mRS = 3   Labs   CBC: No results for input(s): "WBC", "NEUTROABS", "HGB", "HCT", "MCV", "PLT" in the last 168 hours.  Basic Metabolic Panel:  Lab Results  Component Value Date   NA 136 06/19/2021   K 4.4 06/19/2021   CO2 25 06/19/2021   GLUCOSE 182 (H) 06/19/2021   BUN 33 (H) 06/19/2021   CREATININE 1.03 (H) 06/19/2021   CALCIUM 9.0 06/19/2021   GFRNONAA 55 (L) 06/19/2021   GFRAA >60 01/03/2020   Lipid Panel: No results found for: "LDLCALC" HgbA1c:  Lab Results  Component Value Date   HGBA1C 6.5 (H) 09/05/2019   Urine Drug Screen: No results found for: "LABOPIA", "COCAINSCRNUR", "LABBENZ", "AMPHETMU", "THCU", "LABBARB"  Alcohol Level No results found for: "ETH"   Impression   This is an 81 year old woman with past medical history significant for mild cognitive impairment, diabetes, hypertension who was [facility] for [facility] mental status.  LKW unclear, was already lethargic, disoriented, and nauseated when she arrived to facility at 1030. She became increasingly altered over the next 4 hours and at that time was BIB EMS. No response to narcan. On arrival to the ED  she had diffuse weakness in all extremities and was initially mute and then only able to say her first name.  She has significant difficulty following commands and required repeated stimulation to attend.  CT was remarkable for possible acute left insular stroke within aspects of 9 (personal review).  Patient was outside the window for TNK.  CTA showed no LVO. CTP neg.  She is slightly more awake now although still disoriented beyond baseline. Her weakness is diffuse and nonfocal. Favor metabolic/infectious etiology although so far she has only been found to have mild AKI. UA unremarkable. Recommend admission for encephalopathy workup to include MRI brain and EEG as well.   Recommendations   # Acute on chronic encephalopathy # Mild baseline cognitive impairment # Spell of altered consciousness  - Check B1, B12, TSH, RPR. After B1 is drawn start thiamine 100mg  daily - Permissive HTN x48 hrs from sx onset or until stroke ruled out by MRI goal BP <220/110. PRN labetalol or hydralazine if BP above these parameters. Avoid oral antihypertensives. - MRI brain wo contrast - No further stroke workup indicated unless MRI (+) for acute ischemia. If stroke on MRI, please order TTE - Check A1c and LDL + add statin per guidelines - ASA 300mg  PR daily - NPO until evaluated by SLP - q4 hr neuro checks - STAT head CT for any change in neuro exam - Tele - PT/OT/SLP when able to participate - Avoid deliriogenic medications - rEEG tomorrow. If patient's mental status fluctuates overnight, will do continuous EEG instead - Amb referral to neurology upon discharge   ______________________________________________________________________   Thank you for the opportunity to take part in the care of this patient. If you have any further questions, please contact the neurology consultation attending.  Signed,  , MD [facility] [phone]  If 7pm- 7am, please page neurology on call  as listed in AMION.

## 2022-01-06 NOTE — ED Triage Notes (Signed)
BIB [facility] for sudden onset AMS PTA ~ 1200 today, here from [facility], LKN at 1200 noon, noted to have AMS, L side flaccid, global aphasia. Was given narcan 0.4mg  nasal w/o change, EMS gave 1mg  IV w/o change. EKG unremarkable. BS 115. NSL x2 in place. VSS. H/o Mild MCI. Usually A&Ox4 with GCS 15. Arrives A&Ox0, GCS 10. EMS "unable to doi stroke assessment/scale. Took oxycodone earlier reported. Dr. present on arrival.

## 2022-01-06 NOTE — Hospital Course (Signed)
Sydney Gilbert is a 81 y.o. female with medical history significant for T2DM, HTN, HLD, anemia, GERD, depression, mild cognitive impairment who is admitted with acute encephalopathy.

## 2022-01-06 NOTE — Assessment & Plan Note (Signed)
Creatinine 1.46 on admission compared to baseline 1.03. -Start on IV NS@100  mL/hour overnight -Hold HCTZ, lisinopril

## 2022-01-07 DIAGNOSIS — I10 Essential (primary) hypertension: Secondary | ICD-10-CM | POA: Diagnosis not present

## 2022-01-07 DIAGNOSIS — G9340 Encephalopathy, unspecified: Secondary | ICD-10-CM | POA: Diagnosis not present

## 2022-01-07 DIAGNOSIS — E1165 Type 2 diabetes mellitus with hyperglycemia: Secondary | ICD-10-CM | POA: Diagnosis not present

## 2022-01-07 DIAGNOSIS — Z20822 Contact with and (suspected) exposure to covid-19: Secondary | ICD-10-CM | POA: Diagnosis not present

## 2022-01-07 LAB — COMPREHENSIVE METABOLIC PANEL
ALT: 14 U/L (ref 0–44)
AST: 23 U/L (ref 15–41)
Albumin: 3.9 g/dL (ref 3.5–5.0)
Alkaline Phosphatase: 49 U/L (ref 38–126)
Anion gap: 7 (ref 5–15)
BUN: 14 mg/dL (ref 8–23)
CO2: 25 mmol/L (ref 22–32)
Calcium: 9.9 mg/dL (ref 8.9–10.3)
Chloride: 110 mmol/L (ref 98–111)
Creatinine, Ser: 0.94 mg/dL (ref 0.44–1.00)
GFR, Estimated: >60 mL/min (ref >60)
Glucose, Bld: 115 mg/dL — ABNORMAL HIGH (ref 70–99)
Potassium: 3.7 mmol/L (ref 3.5–5.1)
Sodium: 142 mmol/L (ref 135–145)
Total Bilirubin: 0.8 mg/dL (ref 0.3–1.2)
Total Protein: 7.3 g/dL (ref 6.5–8.1)

## 2022-01-07 LAB — CBC WITH DIFFERENTIAL/PLATELET
Abs Immature Granulocytes: 0.01 10*3/uL (ref 0.00–0.07)
Basophils Absolute: 0 10*3/uL (ref 0.0–0.1)
Basophils Relative: 1 %
Eosinophils Absolute: 0.2 10*3/uL (ref 0.0–0.5)
Eosinophils Relative: 3 %
HCT: 35.1 % — ABNORMAL LOW (ref 36.0–46.0)
Hemoglobin: 11.1 g/dL — ABNORMAL LOW (ref 12.0–15.0)
Immature Granulocytes: 0 %
Lymphocytes Relative: 29 %
Lymphs Abs: 1.6 10*3/uL (ref 0.7–4.0)
MCH: 26.9 pg (ref 26.0–34.0)
MCHC: 31.6 g/dL (ref 30.0–36.0)
MCV: 85.2 fL (ref 80.0–100.0)
Monocytes Absolute: 0.4 10*3/uL (ref 0.1–1.0)
Monocytes Relative: 7 %
Neutro Abs: 3.4 10*3/uL (ref 1.7–7.7)
Neutrophils Relative %: 60 %
Platelets: 204 10*3/uL (ref 150–400)
RBC: 4.12 MIL/uL (ref 3.87–5.11)
RDW: 14.7 % (ref 11.5–15.5)
WBC: 5.6 10*3/uL (ref 4.0–10.5)
nRBC: 0 % (ref 0.0–0.2)

## 2022-01-07 LAB — LIPID PANEL
Cholesterol: 122 mg/dL (ref 0–200)
HDL: 52 mg/dL (ref >40)
LDL Cholesterol: 58 mg/dL (ref 0–99)
Total CHOL/HDL Ratio: 2.3 RATIO
Triglycerides: 62 mg/dL (ref <150)
VLDL: 12 mg/dL (ref 0–40)

## 2022-01-07 LAB — CBG MONITORING, ED
Glucose-Capillary: 106 mg/dL — ABNORMAL HIGH (ref 70–99)
Glucose-Capillary: 109 mg/dL — ABNORMAL HIGH (ref 70–99)
Glucose-Capillary: 111 mg/dL — ABNORMAL HIGH (ref 70–99)
Glucose-Capillary: 128 mg/dL — ABNORMAL HIGH (ref 70–99)
Glucose-Capillary: 92 mg/dL (ref 70–99)

## 2022-01-07 LAB — HEMOGLOBIN A1C
Hgb A1c MFr Bld: 5.7 % — ABNORMAL HIGH (ref 4.8–5.6)
Mean Plasma Glucose: 116.89 mg/dL

## 2022-01-07 LAB — MAGNESIUM: Magnesium: 1.7 mg/dL (ref 1.7–2.4)

## 2022-01-07 LAB — GLUCOSE, CAPILLARY: Glucose-Capillary: 202 mg/dL — ABNORMAL HIGH (ref 70–99)

## 2022-01-07 LAB — PHOSPHORUS: Phosphorus: 3.5 mg/dL (ref 2.5–4.6)

## 2022-01-07 LAB — RPR: RPR Ser Ql: NONREACTIVE

## 2022-01-07 NOTE — Progress Notes (Signed)
EEG complete - results pending 

## 2022-01-07 NOTE — ED Notes (Signed)
ED TO INPATIENT HANDOFF REPORT  S Name/Age/Gender Sydney Gilbert 81 y.o. female Room/Bed: 016C/016C  Code Status   Code Status: Full Code  Home/SNF/Other Skilled nursing facility Patient oriented to: self and place Is this baseline? No   Triage Complete: Triage complete  Chief Complaint Acute encephalopathy [G93.40]  Triage Note BIB [facility] for sudden onset AMS PTA ~ 1200 today, here from [facility], [facility] at 1200 noon, noted to have AMS, L side flaccid, global aphasia. Was given narcan 0.4mg  nasal w/o change, EMS gave  IV w/o change. EKG unremarkable. BS 115. NSL x2 in place. VSS. H/o Mild MCI. Usually A&Ox4 with GCS 15. Arrives A&Ox0, GCS 10. EMS "unable to doi stroke assessment/scale. Took oxycodone earlier reported. Dr. Stevie Kern present on arrival.   Allergies Allergies  Allergen Reactions   Metformin And Related    Penicillins Rash    Has patient had a PCN reaction causing immediate rash, facial/tongue/throat swelling, SOB or lightheadedness with hypotension: Yes Has patient had a PCN reaction causing severe rash involving mucus membranes or skin necrosis: No Has patient had a PCN reaction that required hospitalization: NO Has patient had a PCN reaction occurring within the last 10 years: No If all of the above answers are "NO", then may proceed with Cephalosporin use.    Varenicline Itching    Level of Care/Admitting Diagnosis ED Disposition     ED Disposition  Admit   Condition  --   Comment  Hospital Area: [facility] [[id number]]  Level of Care: Telemetry Medical [104]  May place patient in observation at [facility] or [facility] if equivalent level of care is available:: No  Covid Evaluation: Asymptomatic - no recent exposure (last 10 days) testing not required  Diagnosis: Acute encephalopathy [379024]  Admitting Physician: Charlsie Quest [[id number]]  Attending Physician: Charlsie Quest [[id number]]          B Medical/Surgery  History Past Medical History:  Diagnosis Date   Anemia    Arthritis    Depression    Diabetes mellitus without complication (HCC)    GERD (gastroesophageal reflux disease)    Hypertension    Spinal stenosis    History reviewed. No pertinent surgical history.   A IV Location/Drains/Wounds Patient Lines/Drains/Airways Status     Active Line/Drains/Airways     Name Placement date Placement time Site Days   Peripheral IV 01/06/22 18 G 1" Right Antecubital 01/06/22  1525  Antecubital  1   Peripheral IV 01/06/22 16 G 1" Left Antecubital 01/06/22  1526  Antecubital  1            Intake/Output Last 24 hours  Intake/Output Summary (Last 24 hours) at 01/07/2022 1244 Last data filed at 01/07/2022 3419 Gross per 24 hour  Intake 828.77 ml  Output 2000 ml  Net -1171.23 ml    Labs/Imaging Results for orders placed or performed during the hospital encounter of 01/06/22 (from the past 48 hour(s))  Ethanol     Status: None   Collection Time: 01/06/22  3:20 PM  Result Value Ref Range   Alcohol, Ethyl (B) <10 <10 mg/dL    Comment: (NOTE) Lowest detectable limit for serum alcohol is 10 mg/dL.  For medical purposes only. Performed at [facility], [address], [place], [place] [address]   Ammonia     Status: None   Collection Time: 01/06/22  3:20 PM  Result Value Ref Range   Ammonia 19 9 - 35 umol/L  Comment: Performed at [facility], [address] [place], [place] [address]  Vitamin B12     Status: None   Collection Time: 01/06/22  3:20 PM  Result Value Ref Range   Vitamin B-12 482 180 - 914 pg/mL    Comment: (NOTE) This assay is not validated for testing neonatal or myeloproliferative syndrome specimens for Vitamin B12 levels. Performed at [facility], [address] [place], [place] [address]   RPR     Status: None   Collection Time: 01/06/22  3:20 PM  Result Value Ref Range   RPR Ser Ql NON REACTIVE NON REACTIVE    Comment: Performed at  [facility], [address], [place], [place] [address]  Protime-INR     Status: None   Collection Time: 01/06/22  3:22 PM  Result Value Ref Range   Prothrombin Time 12.7 11.4 - 15.2 seconds   INR 1.0 0.8 - 1.2    Comment: (NOTE) INR goal varies based on device and disease states. Performed at [facility], [address] [place], [place] [address]   APTT     Status: Abnormal   Collection Time: 01/06/22  3:22 PM  Result Value Ref Range   aPTT 23 (L) 24 - 36 seconds    Comment: Performed at [facility], [address], [place], [place] [address]  CBC     Status: Abnormal   Collection Time: 01/06/22  3:22 PM  Result Value Ref Range   WBC 7.3 4.0 - 10.5 K/uL   RBC 3.75 (L) 3.87 - 5.11 MIL/uL   Hemoglobin 10.2 (L) 12.0 - 15.0 g/dL   HCT 13.0 (L) 86.5 - 78.4 %   MCV 86.7 80.0 - 100.0 fL   MCH 27.2 26.0 - 34.0 pg   MCHC 31.4 30.0 - 36.0 g/dL   RDW 69.6 29.5 - 28.4 %   Platelets 201 150 - 400 K/uL   nRBC 0.0 0.0 - 0.2 %    Comment: Performed at [facility], [address] [place], [place] [address]  Differential     Status: None   Collection Time: 01/06/22  3:22 PM  Result Value Ref Range   Neutrophils Relative % 59 %   Neutro Abs 4.4 1.7 - 7.7 K/uL   Lymphocytes Relative 27 %   Lymphs Abs 2.0 0.7 - 4.0 K/uL   Monocytes Relative 6 %   Monocytes Absolute 0.4 0.1 - 1.0 K/uL   Eosinophils Relative 7 %   Eosinophils Absolute 0.5 0.0 - 0.5 K/uL   Basophils Relative 1 %   Basophils Absolute 0.1 0.0 - 0.1 K/uL   Immature Granulocytes 0 %   Abs Immature Granulocytes 0.01 0.00 - 0.07 K/uL    Comment: Performed at [facility], [address] [place], [place] [address]  Comprehensive metabolic panel     Status: Abnormal   Collection Time: 01/06/22  3:22 PM  Result Value Ref Range   Sodium 141 135 - 145 mmol/L   Potassium 4.0 3.5 - 5.1 mmol/L   Chloride 108 98 - 111 mmol/L   CO2 20 (L) 22 - 32 mmol/L   Glucose, Bld 124 (H) 70 - 99 mg/dL     Comment: Glucose reference range applies only to samples taken after fasting for at least 8 hours.   BUN 23 8 - 23 mg/dL   Creatinine, Ser 2.53 (H) 0.44 - 1.00 mg/dL   Calcium 9.7 8.9 -  10.3 mg/dL   Total Protein 7.3 6.5 - 8.1 g/dL   Albumin 3.9 3.5 - 5.0 g/dL   AST 35 15 - 41 U/L   ALT 16 0 - 44 U/L   Alkaline Phosphatase 45 38 - 126 U/L   Total Bilirubin 0.5 0.3 - 1.2 mg/dL   GFR, Estimated 36 (L) >60 mL/min    Comment: (NOTE) Calculated using the CKD-EPI Creatinine Equation (2021)    Anion gap 13 5 - 15    Comment: Performed at [facility], [address] [place], [place] [address]  TSH     Status: None   Collection Time: 01/06/22  3:22 PM  Result Value Ref Range   TSH 1.075 0.350 - 4.500 uIU/mL    Comment: Performed by a 3rd Generation assay with a functional sensitivity of <=0.01 uIU/mL. Performed at [facility], [address], [place], [place] [address]   CBG monitoring, ED     Status: Abnormal   Collection Time: 01/06/22  3:25 PM  Result Value Ref Range   Glucose-Capillary 118 (H) 70 - 99 mg/dL    Comment: Glucose reference range applies only to samples taken after fasting for at least 8 hours.  I-stat chem 8, ED     Status: Abnormal   Collection Time: 01/06/22  3:35 PM  Result Value Ref Range   Sodium 141 135 - 145 mmol/L   Potassium 4.0 3.5 - 5.1 mmol/L   Chloride 109 98 - 111 mmol/L   BUN 24 (H) 8 - 23 mg/dL   Creatinine, Ser 5.68 (H) 0.44 - 1.00 mg/dL   Glucose, Bld 127 (H) 70 - 99 mg/dL    Comment: Glucose reference range applies only to samples taken after fasting for at least 8 hours.   Calcium, Ion 1.19 1.15 - 1.40 mmol/L   TCO2 21 (L) 22 - 32 mmol/L   Hemoglobin 11.6 (L) 12.0 - 15.0 g/dL   HCT 51.7 (L) 00.1 - 74.9 %  I-Stat venous blood gas, ([facility] ED only)     Status: Abnormal   Collection Time: 01/06/22  3:36 PM  Result Value Ref Range   pH, Ven 7.390 7.25 - 7.43   pCO2, Ven 36.3 (L) 44 - 60 mmHg   pO2, Ven 44 32 - 45 mmHg   Bicarbonate  21.9 20.0 - 28.0 mmol/L   TCO2 23 22 - 32 mmol/L   O2 Saturation 80 %   Acid-base deficit 3.0 (H) 0.0 - 2.0 mmol/L   Sodium 141 135 - 145 mmol/L   Potassium 4.0 3.5 - 5.1 mmol/L   Calcium, Ion 1.18 1.15 - 1.40 mmol/L   HCT 33.0 (L) 36.0 - 46.0 %   Hemoglobin 11.2 (L) 12.0 - 15.0 g/dL   Sample type VENOUS   Urinalysis, Routine w reflex microscopic     Status: Abnormal   Collection Time: 01/06/22  4:25 PM  Result Value Ref Range   Color, Urine COLORLESS (A) YELLOW   APPearance CLEAR CLEAR   Specific Gravity, Urine 1.015 1.005 - 1.030   pH 6.0 5.0 - 8.0   Glucose, UA NEGATIVE NEGATIVE mg/dL   Hgb urine dipstick NEGATIVE NEGATIVE   Bilirubin Urine NEGATIVE NEGATIVE   Ketones, ur NEGATIVE NEGATIVE mg/dL   Protein, ur NEGATIVE NEGATIVE mg/dL   Nitrite NEGATIVE NEGATIVE   Leukocytes,Ua NEGATIVE NEGATIVE    Comment: Performed at [facility], [address] [place], [place] [address]  Urine rapid drug screen (hosp performed)  Status: None   Collection Time: 01/06/22  4:26 PM  Result Value Ref Range   Opiates NONE DETECTED NONE DETECTED   Cocaine NONE DETECTED NONE DETECTED   Benzodiazepines NONE DETECTED NONE DETECTED   Amphetamines NONE DETECTED NONE DETECTED   Tetrahydrocannabinol NONE DETECTED NONE DETECTED   Barbiturates NONE DETECTED NONE DETECTED    Comment: (NOTE) DRUG SCREEN FOR MEDICAL PURPOSES ONLY.  IF CONFIRMATION IS NEEDED FOR ANY PURPOSE, NOTIFY LAB WITHIN 5 DAYS.  LOWEST DETECTABLE LIMITS FOR URINE DRUG SCREEN Drug Class                     Cutoff (ng/mL) Amphetamine and metabolites    1000 Barbiturate and metabolites    200 Benzodiazepine                 200 Tricyclics and metabolites     300 Opiates and metabolites        300 Cocaine and metabolites        300 THC                            50 Performed at [facility], [address] [place] [address], [place], [place] [address]   Resp Panel by RT-PCR (Flu A&B, Covid) Anterior Nasal Swab     Status: None    Collection Time: 01/06/22  4:31 PM   Specimen: Anterior Nasal Swab  Result Value Ref Range   SARS Coronavirus 2 by RT PCR NEGATIVE NEGATIVE    Comment: (NOTE) SARS-CoV-2 target nucleic acids are NOT DETECTED.  The SARS-CoV-2 RNA is generally detectable in upper respiratory specimens during the acute phase of infection. The lowest concentration of SARS-CoV-2 viral copies this assay can detect is 138 copies/mL. A negative result does not preclude SARS-Cov-2 infection and should not be used as the sole basis for treatment or other patient management decisions. A negative result may occur with  improper specimen collection/handling, submission of specimen other than nasopharyngeal swab, presence of viral mutation(s) within the areas targeted by this assay, and inadequate number of viral copies(<138 copies/mL). A negative result must be combined with clinical observations, patient history, and epidemiological information. The expected result is Negative.  Fact Sheet for Patients:  BloggerCourse.com  Fact Sheet for Healthcare Providers:  SeriousBroker.it  This test is no t yet approved or cleared by the [place] FDA and  has been authorized for detection and/or diagnosis of SARS-CoV-2 by FDA under an Emergency Use Authorization (EUA). This EUA will remain  in effect (meaning this test can be used) for the duration of the COVID-19 declaration under Section 564(b)(1) of the Act, 21 U.S.C.section 360bbb-3(b)(1), unless the authorization is terminated  or revoked sooner.       Influenza A by PCR NEGATIVE NEGATIVE   Influenza B by PCR NEGATIVE NEGATIVE    Comment: (NOTE) The Xpert Xpress SARS-CoV-2/FLU/RSV plus assay is intended as an aid in the diagnosis of influenza from Nasopharyngeal swab specimens and should not be used as a sole basis for treatment. Nasal washings and aspirates are unacceptable for Xpert Xpress  SARS-CoV-2/FLU/RSV testing.  Fact Sheet for Patients: BloggerCourse.com  Fact Sheet for Healthcare Providers: SeriousBroker.it  This test is not yet approved or cleared by the [place] FDA and has been authorized for detection and/or diagnosis of SARS-CoV-2 by FDA under an Emergency Use Authorization (EUA). This EUA will remain in effect (meaning this test can be used) for the  duration of the COVID-19 declaration under Section 564(b)(1) of the Act, 21 U.S.C. section 360bbb-3(b)(1), unless the authorization is terminated or revoked.  Performed at [facility], [address] [place], [place] [address]   CBG monitoring, ED     Status: Abnormal   Collection Time: 01/06/22 10:19 PM  Result Value Ref Range   Glucose-Capillary 143 (H) 70 - 99 mg/dL    Comment: Glucose reference range applies only to samples taken after fasting for at least 8 hours.  CBG monitoring, ED     Status: Abnormal   Collection Time: 01/07/22 12:12 AM  Result Value Ref Range   Glucose-Capillary 109 (H) 70 - 99 mg/dL    Comment: Glucose reference range applies only to samples taken after fasting for at least 8 hours.  CBG monitoring, ED     Status: None   Collection Time: 01/07/22  3:42 AM  Result Value Ref Range   Glucose-Capillary 92 70 - 99 mg/dL    Comment: Glucose reference range applies only to samples taken after fasting for at least 8 hours.  Lipid panel     Status: None   Collection Time: 01/07/22  5:37 AM  Result Value Ref Range   Cholesterol 122 0 - 200 mg/dL   Triglycerides 62 <130<150 mg/dL   HDL 52 >86>40 mg/dL   Total CHOL/HDL Ratio 2.3 RATIO   VLDL 12 0 - 40 mg/dL   LDL Cholesterol 58 0 - 99 mg/dL    Comment:        Total Cholesterol/HDL:CHD Risk Coronary Heart Disease Risk Table                     Men   Women  1/2 Average Risk   3.4   3.3  Average Risk       5.0   4.4  2 X Average Risk   9.6   7.1  3 X Average Risk  23.4    11.0        Use the calculated Patient Ratio above and the CHD Risk Table to determine the patient's CHD Risk.        ATP III CLASSIFICATION (LDL):  <100     mg/dL   Optimal  [id number]  mg/dL   Near or Above                    Optimal  130-159  mg/dL   Borderline  [phone]  mg/dL   High  >413>190     mg/dL   Very High Performed at [facility], [address] [place], [place] [address]   Hemoglobin A1c     Status: Abnormal   Collection Time: 01/07/22  5:37 AM  Result Value Ref Range   Hgb A1c MFr Bld 5.7 (H) 4.8 - 5.6 %    Comment: (NOTE) Pre diabetes:          5.7%-6.4%  Diabetes:              >6.4%  Glycemic control for   <7.0% adults with diabetes    Mean Plasma Glucose 116.89 mg/dL    Comment: Performed at [facility], [address] [place], [place] [address]  CBG monitoring, ED     Status: Abnormal   Collection Time: 01/07/22  7:36 AM  Result Value Ref Range   Glucose-Capillary 111 (H) 70 - 99 mg/dL    Comment: Glucose reference range applies only to samples taken  after fasting for at least 8 hours.  Comprehensive metabolic panel     Status: Abnormal   Collection Time: 01/07/22 11:10 AM  Result Value Ref Range   Sodium 142 135 - 145 mmol/L   Potassium 3.7 3.5 - 5.1 mmol/L   Chloride 110 98 - 111 mmol/L   CO2 25 22 - 32 mmol/L   Glucose, Bld 115 (H) 70 - 99 mg/dL    Comment: Glucose reference range applies only to samples taken after fasting for at least 8 hours.   BUN 14 8 - 23 mg/dL   Creatinine, Ser 2.59 0.44 - 1.00 mg/dL   Calcium 9.9 8.9 - 56.3 mg/dL   Total Protein 7.3 6.5 - 8.1 g/dL   Albumin 3.9 3.5 - 5.0 g/dL   AST 23 15 - 41 U/L   ALT 14 0 - 44 U/L   Alkaline Phosphatase 49 38 - 126 U/L   Total Bilirubin 0.8 0.3 - 1.2 mg/dL   GFR, Estimated >87 >56 mL/min    Comment: (NOTE) Calculated using the CKD-EPI Creatinine Equation (2021)    Anion gap 7 5 - 15    Comment: Performed at [facility], [address] [place], [place] [address]   CBC with Differential/Platelet     Status: Abnormal   Collection Time: 01/07/22 11:10 AM  Result Value Ref Range   WBC 5.6 4.0 - 10.5 K/uL   RBC 4.12 3.87 - 5.11 MIL/uL   Hemoglobin 11.1 (L) 12.0 - 15.0 g/dL   HCT 51.8 (L) 84.1 - 66.0 %   MCV 85.2 80.0 - 100.0 fL   MCH 26.9 26.0 - 34.0 pg   MCHC 31.6 30.0 - 36.0 g/dL   RDW 63.0 16.0 - 10.9 %   Platelets 204 150 - 400 K/uL   nRBC 0.0 0.0 - 0.2 %   Neutrophils Relative % 60 %   Neutro Abs 3.4 1.7 - 7.7 K/uL   Lymphocytes Relative 29 %   Lymphs Abs 1.6 0.7 - 4.0 K/uL   Monocytes Relative 7 %   Monocytes Absolute 0.4 0.1 - 1.0 K/uL   Eosinophils Relative 3 %   Eosinophils Absolute 0.2 0.0 - 0.5 K/uL   Basophils Relative 1 %   Basophils Absolute 0.0 0.0 - 0.1 K/uL   Immature Granulocytes 0 %   Abs Immature Granulocytes 0.01 0.00 - 0.07 K/uL    Comment: Performed at [facility], [address], [place], [place] [address]  Magnesium     Status: None   Collection Time: 01/07/22 11:10 AM  Result Value Ref Range   Magnesium 1.7 1.7 - 2.4 mg/dL    Comment: Performed at [facility], [address], [place], [place] [address]  Phosphorus     Status: None   Collection Time: 01/07/22 11:10 AM  Result Value Ref Range   Phosphorus 3.5 2.5 - 4.6 mg/dL    Comment: Performed at [facility], [address] [place], [place] [address]  CBG monitoring, ED     Status: Abnormal   Collection Time: 01/07/22 11:17 AM  Result Value Ref Range   Glucose-Capillary 128 (H) 70 - 99 mg/dL    Comment: Glucose reference range applies only to samples taken after fasting for at least 8 hours.  CBG monitoring, ED     Status: Abnormal   Collection Time: 01/07/22 11:58 AM  Result Value Ref Range   Glucose-Capillary 106 (H) 70 - 99 mg/dL    Comment:  Glucose reference range applies only to samples taken after fasting for at least 8 hours.   Comment 1 Notify RN    Comment 2 Document in Chart    EEG adult  Result Date: 01/07/2022 Charlsie Quest, MD     01/07/2022  9:11 AM Patient Name: Sydney Gilbert MRN: [id number] Epilepsy Attending: Charlsie Quest Referring Physician/Provider: Charlsie Quest, MD Date: 01/06/2022 Duration: 24.16 mins Patient history: 81yo F with ams. EEG to evaluate for seizure. Level of alertness: Awake, asleep AEDs during EEG study: None Technical aspects: This EEG study was done with scalp electrodes positioned according to the 10-20 International system of electrode placement. Electrical activity was reviewed with band pass filter of 1-70Hz , sensitivity of 7 uV/mm, display speed of 70mm/sec with a  notched filter applied as appropriate. EEG data were recorded continuously and digitally stored.  Video monitoring was available and reviewed as appropriate. Description: The posterior dominant rhythm consists of 8 Hz activity of moderate voltage (25-35 uV) seen predominantly in posterior head regions, symmetric and reactive to eye opening and eye closing. Sleep was characterized by vertex waves, sleep spindles (12 to 14 Hz), maximal frontocentral region. EEG showed intermittent generalized 3 to 6 Hz theta-delta slowing. Hyperventilation and photic stimulation were not performed.   ABNORMALITY - Intermittent slow, generalized IMPRESSION: This study is suggestive of mild diffuse encephalopathy, nonspecific etiology. No seizures or epileptiform discharges were seen throughout the recording. Charlsie Quest   DG Abd 1 View  Result Date: 01/06/2022 CLINICAL DATA:  Nausea and vomiting. EXAM: ABDOMEN - 1 VIEW COMPARISON:  None Available. FINDINGS: No bowel dilatation or evidence of obstruction. Small volume of formed stool in the right colon. There is excreted IV contrast within the urinary bladder from prior head and neck CTA. Surgical clips in the right upper quadrant typical of cholecystectomy. The lung bases are clear. Surgical hardware in the lumbosacral spine. IMPRESSION: Normal bowel gas pattern.  No obstruction.  Electronically Signed   By: Narda Rutherford M.D.   On: 01/06/2022 21:36   MR BRAIN WO CONTRAST  Result Date: 01/06/2022 CLINICAL DATA:  Altered mental status EXAM: MRI HEAD WITHOUT CONTRAST TECHNIQUE: Multiplanar, multiecho pulse sequences of the brain and surrounding structures were obtained without intravenous contrast. COMPARISON:  None Available. FINDINGS: Brain: No acute infarct, mass effect or extra-axial collection. No acute or chronic hemorrhage. There is multifocal hyperintense T2-weighted signal within the white matter. Generalized volume loss. The midline structures are normal. Vascular: Major flow voids are preserved. Skull and upper cervical spine: Normal calvarium and skull base. Visualized upper cervical spine and soft tissues are normal. Sinuses/Orbits:Left mastoid effusion. Paranasal sinuses are clear. Normal orbits. IMPRESSION: 1. No acute intracranial abnormality. 2. 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 3. Left mastoid effusion. Electronically Signed   By: Deatra Robinson M.D.   On: 01/06/2022 20:28   CT ANGIO HEAD NECK W WO CM W PERF (CODE STROKE)  Result Date: 01/06/2022 CLINICAL DATA:  Left-sided weakness and possible right-sided gaze preference. EXAM: CT ANGIOGRAPHY HEAD AND NECK CT PERFUSION BRAIN TECHNIQUE: Multidetector CT imaging of the head and neck was performed using the standard protocol during bolus administration of intravenous contrast. Multiplanar CT image reconstructions and MIPs were obtained to evaluate the vascular anatomy. Carotid stenosis measurements (when applicable) are obtained utilizing NASCET criteria, using the distal internal carotid diameter as the denominator. Multiphase CT imaging of the brain was performed following IV bolus contrast injection. Subsequent parametric perfusion maps were  calculated using RAPID software. RADIATION DOSE REDUCTION: This exam was performed according to the departmental dose-optimization program which includes  automated exposure control, adjustment of the mA and/or kV according to patient size and/or use of iterative reconstruction technique. CONTRAST:  OMNIPAQUE IOHEXOL 350 MG/ML SOLN COMPARISON:  Same-day noncontrast CT head. Soft tissue neck CT 03/02/2013 FINDINGS: CTA NECK FINDINGS Aortic arch: The imaged aortic arch is normal. The origin of the brachiocephalic artery is not included within the field of view. The origin of the left subclavian artery is patent with mild stenosis due to calcified plaque. The subclavian arteries are otherwise patent to the level imaged. Right carotid system: The right common carotid artery is patent with scattered calcified plaque resulting in less than 50% stenosis. There is calcified plaque at the bifurcation resulting in less than 50% stenosis. The distal right internal carotid artery is patent. The right external carotid artery is patent with mild-to-moderate stenosis proximally. There is no evidence of dissection or aneurysm. Left carotid system: The left common carotid artery is patent with scattered calcified plaque resulting in less than 50% stenosis. There is minimal plaque at the bifurcation without significant stenosis. The distal left internal carotid artery is widely patent. The left external carotid artery is patent. There is no dissection or aneurysm. Vertebral arteries: The left vertebral artery is dominant, a normal variant. A portion of the distal right V2 segment is suboptimally evaluated due to streak artifact from cervical spine hardware. Within this confine, the vertebral arteries are patent, without hemodynamically significant stenosis or occlusion. There is no dissection or aneurysm. Skeleton: There is no acute osseous abnormality or suspicious osseous lesion. Postsurgical changes reflecting posterior instrumented fusion from C2 through T1 are noted. There is no evidence of hardware related complication. There is no visible canal hematoma. Other neck: The  soft tissues of the neck are unremarkable. Upper chest: The imaged lung apices are clear. Review of the MIP images confirms the above findings CTA HEAD FINDINGS Anterior circulation: The intracranial ICAs are patent with mild calcified plaque but no hemodynamically significant stenosis or occlusion. The bilateral MCAs are patent. The bilateral ACAs are patent. The anterior communicating artery is normal. There is no aneurysm or AVM. Posterior circulation: The right V4 segment is hypoplastic/absent after the PICA origin, likely a developmental variant and similar in appearance to the prior CT neck from 2014. The dominant left V4 segment is patent with focal calcified plaque resulting in mild-to-moderate stenosis. The basilar artery is patent. The major cerebellar arteries appear patent. The bilateral PCAs are patent with mild irregularity and narrowing of the left P2 segment. Bilateral posterior communicating arteries are identified. There is no aneurysm or AVM. Venous sinuses: Patent. Anatomic variants: As above. Review of the MIP images confirms the above findings CT Brain Perfusion Findings: CBF (<30%) Volume: 0mL Perfusion (Tmax>6.0s) volume: 0mL Mismatch Volume: 0mL Infarction Location:N/a IMPRESSION: 1. No emergent large vessel occlusion. 2. No infarct core or penumbra identified on CT perfusion. 3. Calcified plaque of the bilateral carotid bifurcations right worse than left without hemodynamically significant stenosis or occlusion. Patent vertebral arteries. 4. Mild intracranial atherosclerotic disease with no proximal high-grade stenosis or occlusion. Electronically Signed   By: Lesia Hausen M.D.   On: 01/06/2022 16:04   CT HEAD CODE STROKE WO CONTRAST  Result Date: 01/06/2022 CLINICAL DATA:  Code stroke. Neuro deficit, acute, stroke suspected. EXAM: CT HEAD WITHOUT CONTRAST TECHNIQUE: Contiguous axial images were obtained from the base of the skull through the vertex without  intravenous contrast.  RADIATION DOSE REDUCTION: This exam was performed according to the departmental dose-optimization program which includes automated exposure control, adjustment of the mA and/or kV according to patient size and/or use of iterative reconstruction technique. COMPARISON:  01/03/2020 FINDINGS: Brain: Chronic small-vessel ischemic change affects the pons. No focal cerebellar insult. Chronic small-vessel ischemic changes affect the cerebral hemispheric white matter. Question mild loss of gray-white differentiation at the deep insula on the left. This is not definite. No evidence of hemorrhage, hydrocephalus or extra-axial collection. Vascular: There is atherosclerotic calcification of the major vessels at the base of the brain. Skull: Negative Sinuses/Orbits: Clear/normal Other: None ASPECTS (Alberta Stroke Program Early CT Score) - Ganglionic level infarction (caudate, lentiform nuclei, internal capsule, insula, M1-M3 cortex): 6 - Supraganglionic infarction (M4-M6 cortex): 3 Total score (0-10 with 10 being normal): 9 IMPRESSION: 1. 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 Question small area loss of gray-white differentiation at the deep insula on the left, not definite. 2. ASPECTS is 9, possibly. 3. These results were communicated to Dr. Selina Cooley at 3:37 pm on 01/06/2022 by text page via the [facility] messaging system. Electronically Signed   By: Paulina Fusi M.D.   On: 01/06/2022 15:39    Pending Labs Unresulted Labs (From admission, onward)     Start     Ordered   01/06/22 1830  Vitamin B1  Once,   URGENT        01/06/22 1829            Vitals/Pain Today's Vitals   01/07/22 1145 01/07/22 1200 01/07/22 1215 01/07/22 1230  BP: (!) 192/148 (!) 186/80 (!) 172/83 (!) 148/76  Pulse: 74 77 76 86  Resp: 18 15 20  (!) 22  Temp:      TempSrc:      SpO2: 100% 100% 100% 100%  Weight:        Isolation Precautions No active isolations  Medications Medications  labetalol  (NORMODYNE) 5 MG/ML injection (  Not Given 01/06/22 1909)  thiamine (VITAMIN B1) injection 100 mg (100 mg Intravenous Given 01/07/22 0911)    Or  thiamine (VITAMIN B1) tablet 100 mg ( Oral See Alternative 01/07/22 0911)  aspirin chewable tablet 81 mg (81 mg Oral Given 01/07/22 0908)    Or  aspirin suppository 300 mg ( Rectal See Alternative 01/07/22 0908)   stroke: early stages of recovery book (has no administration in time range)  acetaminophen (TYLENOL) tablet 650 mg (has no administration in time range)    Or  acetaminophen (TYLENOL) 160 MG/5ML solution 650 mg (has no administration in time range)    Or  acetaminophen (TYLENOL) suppository 650 mg (has no administration in time range)  senna-docusate (Senokot-S) tablet 1 tablet (has no administration in time range)  heparin injection 5,000 Units (5,000 Units Subcutaneous Given 01/07/22 0616)  labetalol (NORMODYNE) injection 10 mg (has no administration in time range)  insulin aspart (novoLOG) injection 0-9 Units (0 Units Subcutaneous Hold 01/07/22 1159)  DULoxetine (CYMBALTA) DR capsule 60 mg (60 mg Oral Given 01/07/22 0908)  simvastatin (ZOCOR) tablet 10 mg (has no administration in time range)  iohexol (OMNIPAQUE) 350 MG/ML injection 100 mL (100 mLs Intravenous Contrast Given 01/06/22 1543)  sodium chloride 0.9 % bolus 500 mL (0 mLs Intravenous Stopped 01/06/22 1805)    Mobility walks with person assist High fall risk   Focused Assessments Neuro Assessment Handoff:  Swallow screen pass? Yes  Cardiac Rhythm: Normal sinus rhythm (HR 75) NIH Stroke  Scale ( + Modified Stroke Scale Criteria)  Interval: Initial Level of Consciousness (1a.)   : Alert, keenly responsive LOC Questions (1b. )   +: Answers one question correctly LOC Commands (1c. )   + : Performs both tasks correctly Best Gaze (2. )  +: Normal Visual (3. )  +: No visual loss Facial Palsy (4. )    : Minor paralysis Motor Arm, Left (5a. )   +: No drift Motor Arm, Right (5b. )   +: No  drift Motor Leg, Left (6a. )   +: No drift Motor Leg, Right (6b. )   +: No drift Limb Ataxia (7. ): Absent Sensory (8. )   +: Normal, no sensory loss Best Language (9. )   +: No aphasia Dysarthria (10. ): Severe dysarthria, patient's speech is so slurred as to be unintelligible in the absence of or out of proportion to any dysphasia, or is mute/anarthric Extinction/Inattention (11.)   +: No Abnormality Modified SS Total  +: 1 Complete NIHSS TOTAL: 17 Last date known well: 01/06/22 Last time known well: 1030 Neuro Assessment: Exceptions to WDL Neuro Checks:   Initial (01/06/22 1530)  Last Documented NIHSS Modified Score: 1 (01/07/22 0130) Has TPA been given? No If patient is a Neuro Trauma and patient is going to OR before floor call report to 4N Charge nurse: [phone] or [phone]   R Recommendations: See Admitting Provider Note

## 2022-01-07 NOTE — Evaluation (Signed)
Occupational Therapy Evaluation Patient Details Name: Sydney Gilbert MRN: [id number] DOB: 02/11/1941 Today's Date: 01/07/2022   History of Present Illness Pt is an 81 y/o female [facility]. Thought to be secondary to acute encephalopathy. PMH includes DM, HTN, and mild cognitive impairment.   Clinical Impression   Pt admitted for concerns listed above. PTA pt reported that she was having assist with all ADL's from her aide, and independent with functional mobility in her home. At this time, pt is requiring up to max A  +2 for ADL's that require standing and mod A +2 to stand and maintain balance. Pt is demonstrating poor balance, increased weakness, decreased safety awareness, and decreased activity tolerance. Recommending SNF level therapies to maximize her independence and safety. OT will follow acutely.      Recommendations for follow up therapy are one component of a multi-disciplinary discharge planning process, led by the attending physician.  Recommendations may be updated based on patient status, additional functional criteria and insurance authorization.   Follow Up Recommendations  Skilled nursing-short term rehab (<3 hours/day)    Assistance Recommended at Discharge Frequent or constant Supervision/Assistance  Patient can return home with the following Two people to help with walking and/or transfers;Two people to help with bathing/dressing/bathroom;Assistance with cooking/housework;Direct supervision/assist for medications management;Assist for transportation;Help with stairs or ramp for entrance    Functional Status Assessment  Patient has had a recent decline in their functional status and demonstrates the ability to make significant improvements in function in a reasonable and predictable amount of time.  Equipment Recommendations  BSC/3in1;Other (comment) (TBD)    Recommendations for Other Services       Precautions / Restrictions  Precautions Precautions: Fall Restrictions Weight Bearing Restrictions: No      Mobility Bed Mobility Overal bed mobility: Needs Assistance Bed Mobility: Supine to Sit, Sit to Supine     Supine to sit: Min assist, +2 for physical assistance, +2 for safety/equipment Sit to supine: Min assist, +2 for physical assistance, +2 for safety/equipment   General bed mobility comments: Assist for trunk and LE to come to sitting.    Transfers Overall transfer level: Needs assistance Equipment used: 2 person hand held assist Transfers: Sit to/from Stand Sit to Stand: Mod assist, +2 physical assistance           General transfer comment: Mod A +2 for lift assist and steadying. Increased weakness noted in standing.      Balance Overall balance assessment: Needs assistance Sitting-balance support: No upper extremity supported, Feet supported Sitting balance-Leahy Scale: Fair     Standing balance support: Bilateral upper extremity supported Standing balance-Leahy Scale: Poor Standing balance comment: Reliant on UE support                           ADL either performed or assessed with clinical judgement   ADL Overall ADL's : Needs assistance/impaired Eating/Feeding: Set up;Sitting   Grooming: Minimal assistance;Sitting   Upper Body Bathing: Minimal assistance;Sitting   Lower Body Bathing: Maximal assistance;Sitting/lateral leans;Sit to/from stand;+2 for safety/equipment   Upper Body Dressing : Minimal assistance;Sitting   Lower Body Dressing: Maximal assistance;+2 for safety/equipment;Sit to/from stand   Toilet Transfer: Moderate assistance;+2 for physical assistance;+2 for safety/equipment;Stand-pivot   Toileting- Clothing Manipulation and Hygiene: Maximal assistance;+2 for safety/equipment;Sitting/lateral lean;Sit to/from stand       Functional mobility during ADLs: Moderate assistance;+2 for physical assistance;+2 for safety/equipment General ADL Comments:  Pt limited by  increased weaknessand fatigue     Vision Baseline Vision/History: 1 Wears glasses Ability to See in Adequate Light: 1 Impaired Patient Visual Report: No change from baseline Vision Assessment?: No apparent visual deficits     Perception     Praxis      Pertinent Vitals/Pain Pain Assessment Pain Assessment: No/denies pain Faces Pain Scale: No hurt     Hand Dominance Right   Extremity/Trunk Assessment Upper Extremity Assessment Upper Extremity Assessment: Generalized weakness   Lower Extremity Assessment Lower Extremity Assessment: Defer to PT evaluation   Cervical / Trunk Assessment Cervical / Trunk Assessment: Kyphotic   Communication Communication Communication: No difficulties   Cognition Arousal/Alertness: Awake/alert Behavior During Therapy: WFL for tasks assessed/performed Overall Cognitive Status: No family/caregiver present to determine baseline cognitive functioning                                 General Comments: Somewhat slowed processing. Per chart hx of mild cognitive impairment.     General Comments  VSS on RA    Exercises     Shoulder Instructions      Home Living Family/patient expects to be discharged to:: Private residence Living Arrangements: Other relatives (sister) Available Help at Discharge: Family Type of Home: House Home Access: Ramped entrance     Home Layout: One level     Bathroom Shower/Tub: Producer, television/film/video: Standard     Home Equipment: Shower seat;BSC/3in1;Grab bars - tub/shower;Rolling Environmental consultant (2 wheels);Rollator (4 wheels);Wheelchair - manual   Additional Comments: Aide comes 5 days/week for 2 hours      Prior Functioning/Environment Prior Level of Function : Needs assist             Mobility Comments: Uses RW for short distance ambulation. Uses WC when going out of the house to [facility]. ADLs Comments: Needs assist for ADLs from aide        OT Problem List:  Decreased strength;Decreased activity tolerance;Decreased safety awareness;Decreased coordination;Decreased knowledge of use of DME or AE;Impaired UE functional use      OT Treatment/Interventions: Self-care/ADL training;Therapeutic exercise;Energy conservation;DME and/or AE instruction;Therapeutic activities;Patient/family education;Cognitive remediation/compensation;Balance training    OT Goals(Current goals can be found in the care plan section) Acute Rehab OT Goals Patient Stated Goal: To go home to her sister OT Goal Formulation: With patient Time For Goal Achievement: 01/21/22 Potential to Achieve Goals: Good ADL Goals Pt Will Perform Grooming: with modified independence;standing Pt Will Perform Lower Body Bathing: with modified independence;sitting/lateral leans;sit to/from stand Pt Will Perform Lower Body Dressing: with modified independence;sitting/lateral leans;sit to/from stand Pt Will Transfer to Toilet: with modified independence;ambulating Pt Will Perform Toileting - Clothing Manipulation and hygiene: with modified independence;sitting/lateral leans;sit to/from stand Additional ADL Goal #1: Pt will complete bed mobility independently, as a precursor to seated ADL's.  OT Frequency: Min 2X/week    Co-evaluation PT/OT/SLP Co-Evaluation/Treatment: Yes Reason for Co-Treatment: For patient/therapist safety;To address functional/ADL transfers PT goals addressed during session: Balance;Mobility/safety with mobility OT goals addressed during session: ADL's and self-care;Strengthening/ROM      AM-PAC OT "6 Clicks" Daily Activity     Outcome Measure Help from another person eating meals?: A Little Help from another person taking care of personal grooming?: A Little Help from another person toileting, which includes using toliet, bedpan, or urinal?: A Lot Help from another person bathing (including washing, rinsing, drying)?: A Lot Help from another person to put on and taking  off  regular upper body clothing?: A Little Help from another person to put on and taking off regular lower body clothing?: A Lot 6 Click Score: 15   End of Session Equipment Utilized During Treatment: Gait belt Nurse Communication: Mobility status  Activity Tolerance: Patient tolerated treatment well Patient left: in bed;with call bell/phone within reach  OT Visit Diagnosis: Unsteadiness on feet (R26.81);Other abnormalities of gait and mobility (R26.89);Muscle weakness (generalized) (M62.81)                Time: 9629-5284 OT Time Calculation (min): 22 min Charges:  OT General Charges $OT Visit: 1 Visit OT Evaluation $OT Eval Moderate Complexity: 1 Mod  Jarielys Girardot H., OTR/L Acute Rehabilitation  Landrie Beale Elane Bing Plume 01/07/2022, 11:38 AM

## 2022-01-07 NOTE — Evaluation (Signed)
Physical Therapy Evaluation Patient Details Name: Sydney Gilbert MRN: [id number] DOB: 1940/06/20 Today's Date: 01/07/2022  History of Present Illness  Pt is an 81 y/o female admitted [facility] A[facility]. Thought to be secondary to acute encephalopathy. PMH includes DM, HTN, and mild cognitive impairment.  Clinical Impression  Pt admitted secondary to problem above with deficits below. Pt with increased weakness and unsteadiness and requiring mod A +2 to stand and take side steps at EOB. Pt previously at a mod I level with ambulation using RW. Recommending SNF level therapies at d/c to address mobility deficits. Will continue to follow acutely.      Recommendations for follow up therapy are one component of a multi-disciplinary discharge planning process, led by the attending physician.  Recommendations may be updated based on patient status, additional functional criteria and insurance authorization.  Follow Up Recommendations Skilled nursing-short term rehab (<3 hours/day) Can patient physically be transported by private vehicle: No    Assistance Recommended at Discharge Frequent or constant Supervision/Assistance  Patient can return home with the following  Two people to help with walking and/or transfers;Two people to help with bathing/dressing/bathroom;Assistance with cooking/housework;Help with stairs or ramp for entrance;Assist for transportation    Equipment Recommendations None recommended by PT  Recommendations for Other Services       Functional Status Assessment Patient has had a recent decline in their functional status and demonstrates the ability to make significant improvements in function in a reasonable and predictable amount of time.     Precautions / Restrictions Precautions Precautions: Fall Restrictions Weight Bearing Restrictions: No      Mobility  Bed Mobility Overal bed mobility: Needs Assistance Bed Mobility: Supine to Sit, Sit to Supine      Supine to sit: Min assist, +2 for physical assistance, +2 for safety/equipment Sit to supine: Min assist, +2 for physical assistance, +2 for safety/equipment   General bed mobility comments: Assist for trunk and LE to come to sitting.    Transfers Overall transfer level: Needs assistance Equipment used: 2 person hand held assist Transfers: Sit to/from Stand Sit to Stand: Mod assist, +2 physical assistance           General transfer comment: Mod A +2 for lift assist and steadying. Increased weakness noted in standing.    Ambulation/Gait Ambulation/Gait assistance: Mod assist, +2 physical assistance   Assistive device: 2 person hand held assist         General Gait Details: Increased buckling noted when taking steps at EOB. Mod A +2 for steadying assist.  Stairs            Wheelchair Mobility    Modified Rankin (Stroke Patients Only)       Balance Overall balance assessment: Needs assistance Sitting-balance support: No upper extremity supported, Feet supported Sitting balance-Leahy Scale: Fair     Standing balance support: Bilateral upper extremity supported Standing balance-Leahy Scale: Poor Standing balance comment: Reliant on UE support                             Pertinent Vitals/Pain Pain Assessment Pain Assessment: Faces Faces Pain Scale: No hurt    Home Living Family/patient expects to be discharged to:: Private residence Living Arrangements: Other relatives (sister) Available Help at Discharge: Family Type of Home: House Home Access: Ramped entrance       Home Layout: One level Home Equipment: Shower seat;BSC/3in1;Grab bars - tub/shower;Rolling Environmental consultant (2 wheels);Rollator (4 wheels);Wheelchair -  manual Additional Comments: Aide comes 5 days/week for 2 hours    Prior Function Prior Level of Function : Needs assist             Mobility Comments: Uses RW for short distance ambulation. Uses WC when going out of the house to  [facility]. ADLs Comments: Needs assist for ADLs from aide     Hand Dominance        Extremity/Trunk Assessment   Upper Extremity Assessment Upper Extremity Assessment: Defer to OT evaluation    Lower Extremity Assessment Lower Extremity Assessment: Generalized weakness    Cervical / Trunk Assessment Cervical / Trunk Assessment: Kyphotic  Communication   Communication: No difficulties  Cognition Arousal/Alertness: Awake/alert Behavior During Therapy: WFL for tasks assessed/performed Overall Cognitive Status: No family/caregiver present to determine baseline cognitive functioning                                 General Comments: Somewhat slowed processing. Per chart hx of mild cognitive impairment.        General Comments      Exercises     Assessment/Plan    PT Assessment Patient needs continued PT services  PT Problem List Decreased strength;Decreased activity tolerance;Decreased mobility;Decreased balance;Decreased knowledge of use of DME;Decreased knowledge of precautions;Decreased cognition       PT Treatment Interventions Gait training;DME instruction;Functional mobility training;Therapeutic activities;Therapeutic exercise;Balance training;Patient/family education    PT Goals (Current goals can be found in the Care Plan section)  Acute Rehab PT Goals Patient Stated Goal: to go home PT Goal Formulation: With patient Time For Goal Achievement: 01/21/22 Potential to Achieve Goals: Good    Frequency Min 2X/week     Co-evaluation PT/OT/SLP Co-Evaluation/Treatment: Yes Reason for Co-Treatment: For patient/therapist safety;To address functional/ADL transfers PT goals addressed during session: Balance;Mobility/safety with mobility         AM-PAC PT "6 Clicks" Mobility  Outcome Measure Help needed turning from your back to your side while in a flat bed without using bedrails?: A Little Help needed moving from lying on your back to sitting on the  side of a flat bed without using bedrails?: A Little Help needed moving to and from a bed to a chair (including a wheelchair)?: Total Help needed standing up from a chair using your arms (e.g., wheelchair or bedside chair)?: Total Help needed to walk in hospital room?: Total Help needed climbing 3-5 steps with a railing? : Total 6 Click Score: 10    End of Session Equipment Utilized During Treatment: Gait belt Activity Tolerance: Patient tolerated treatment well Patient left: in bed;with call bell/phone within reach (on stretcher in ED) Nurse Communication: Mobility status PT Visit Diagnosis: Unsteadiness on feet (R26.81);Muscle weakness (generalized) (M62.81);History of falling (Z91.81);Difficulty in walking, not elsewhere classified (R26.2)    Time: 6067-7034 PT Time Calculation (min) (ACUTE ONLY): 24 min   Charges:   PT Evaluation $PT Eval Moderate Complexity: 1 Mod          Farley Ly, PT, DPT  Acute Rehabilitation Services  Office: [phone]   Lehman Prom 01/07/2022, 10:28 AM

## 2022-01-07 NOTE — Procedures (Signed)
Patient Name: Anjenette Gerbino  MRN: [id number]  Epilepsy Attending: Charlsie Quest  Referring Physician/Provider: Charlsie Quest, MD Date: 01/06/2022 Duration: 24.16 mins  Patient history: 81yo F with ams. EEG to evaluate for seizure.  Level of alertness: Awake, asleep  AEDs during EEG study: None  Technical aspects: This EEG study was done with scalp electrodes positioned according to the 10-20 International system of electrode placement. Electrical activity was reviewed with band pass filter of 1-70Hz , sensitivity of 7 uV/mm, display speed of 25mm/sec with a 60Hz  notched filter applied as appropriate. EEG data were recorded continuously and digitally stored.  Video monitoring was available and reviewed as appropriate.  Description: The posterior dominant rhythm consists of 8 Hz activity of moderate voltage (25-35 uV) seen predominantly in posterior head regions, symmetric and reactive to eye opening and eye closing. Sleep was characterized by vertex waves, sleep spindles (12 to 14 Hz), maximal frontocentral region. EEG showed intermittent generalized 3 to 6 Hz theta-delta slowing. Hyperventilation and photic stimulation were not performed.     ABNORMALITY - Intermittent slow, generalized  IMPRESSION: This study is suggestive of mild diffuse encephalopathy, nonspecific etiology. No seizures or epileptiform discharges were seen throughout the recording.  Hansika Leaming 

## 2022-01-07 NOTE — Progress Notes (Signed)
CSW left voicemail for patient's [facility] SW, Marylu Lund, regarding discharge plan.  Joaquin Courts, MSW, [facility], [place]

## 2022-01-07 NOTE — Progress Notes (Signed)
PROGRESS NOTE    Sydney Gilbert  [id number] DOB: [birth year] DOA: 01/06/2022 PCP: [facility]    Brief Narrative:  81 year old female with history of type 2 diabetes, hypertension, hyperlipidemia, anemia, GERD, depression, mild cognitive impairment from home presented from daycare where she had episode of vomiting and was more confused.  Patient tells me that she was just not feeling well, she was nauseous and threw up.  Patient was noted to be more lethargic so EMS was called and brought to the ER.  Initially in the ER, she was aphasic and confused and appeared to have bilateral lower extremity weakness. Hemodynamically stable.  Was on room air.  Irregularity was normal.  TSH was normal.  Urine was negative.  COVID-19 and influenza negative.  UDS negative.  Skeletal survey negative.  Admitted for further work-up and extreme weakness.   Assessment & Plan:   Acute metabolic encephalopathy, TIA, multifactorial with dehydration and AKI. Initially suspected possible left-sided neglect and confusion, currently normalized. CT head with small area of loss of gray-white differentiation at deep insula of the left. CTA of the head and neck with perfusion studies, negative for large vessel occlusion infarct or penumbra. MRI brain with no acute infarct. EEG without any epileptiform discharges. Chest x-ray and urinalysis along with COVID-19 and influenza negative. Neurologically stabilized.  Extremely debilitated and weak. PT OT and speech, refer to SNF.  Acute kidney injury: Treated with IV fluids.  Clinically improving.  We will recheck chemistry today.  Essential hypertension: Dehydrated on presentation.  Holding antihypertensives.  Will resume home medications if her renal functions normalize.  Depression: On Cymbalta.  Hyperlipidemia: On simvastatin.  Debility: 2 person assist to move around.  Lives at home with sister and helps her.  Patient is unsafe to  mobilize without supervision and assist. She does not want to leave her sister at home who needs more help than herself. Patient is not safe, we discussed about rehab option. [facility]. Medically stable to transfer if they find a bed.   DVT prophylaxis: heparin injection 5,000 Units Start: 01/06/22 2200   Code Status: Full code Family Communication: None Disposition Plan: Status is: Observation The patient remains OBS appropriate and will d/c before 2 midnights.     Consultants:  Neurology  Procedures:  None  Antimicrobials:  None   Subjective: Patient seen in the emergency room trying to work with physical therapy and Occupational Therapy.  Patient tells me she has some buckling of her knees.  Mental status has improved.  She is not sure what triggered her to have nausea and vomiting.  Patient could not take the steps by herself today.  Looks extremely weak and debilitated.  Wants to go home though.  Objective: Vitals:   01/07/22 0845 01/07/22 0900 01/07/22 0915 01/07/22 1020  BP: (!) 186/63 (!) 191/69 (!) 146/80 134/66  Pulse: 74 74 79 80  Resp: (!) 21 20 12  (!) 24  Temp:      TempSrc:      SpO2: 99% 100% 98% 100%  Weight:        Intake/Output Summary (Last 24 hours) at 01/07/2022 1051 Last data filed at 01/07/2022 0903 Gross per 24 hour  Intake 828.77 ml  Output 2000 ml  Net -1171.23 ml   Filed Weights   01/06/22 1500  Weight: 70.6 kg    Examination:  General exam: Appears calm and comfortable at rest. Frail and debilitated.  Extremely anxious  on attempted mobility. Respiratory system: Bilateral clear. Cardiovascular system: S1 & S2 heard, RRR. No pedal edema. Central nervous system: Alert and oriented. No focal neurological deficits.  Generalized weakness.    Data Reviewed: I have personally reviewed following labs and imaging studies  CBC: Recent Labs  Lab 01/06/22 1522 01/06/22 1535 01/06/22 1536   WBC 7.3  --   --   NEUTROABS 4.4  --   --   HGB 10.2* 11.6* 11.2*  HCT 32.5* 34.0* 33.0*  MCV 86.7  --   --   PLT 201  --   --    Basic Metabolic Panel: Recent Labs  Lab 01/06/22 1522 01/06/22 1535 01/06/22 1536  NA 141 141 141  K 4.0 4.0 4.0  CL 108 109  --   CO2 20*  --   --   GLUCOSE 124* 119*  --   BUN 23 24*  --   CREATININE 1.46* 1.40*  --   CALCIUM 9.7  --   --    GFR: CrCl cannot be calculated (Unknown ideal weight.). Liver Function Tests: Recent Labs  Lab 01/06/22 1522  AST 35  ALT 16  ALKPHOS 45  BILITOT 0.5  PROT 7.3  ALBUMIN 3.9   No results for input(s): "LIPASE", "AMYLASE" in the last 168 hours. Recent Labs  Lab 01/06/22 1520  AMMONIA 19   Coagulation Profile: Recent Labs  Lab 01/06/22 1522  INR 1.0   Cardiac Enzymes: No results for input(s): "CKTOTAL", "CKMB", "CKMBINDEX", "TROPONINI" in the last 168 hours. BNP (last 3 results) No results for input(s): "PROBNP" in the last 8760 hours. HbA1C: Recent Labs    01/07/22 0537  HGBA1C 5.7*   CBG: Recent Labs  Lab 01/06/22 1525 01/06/22 2219 01/07/22 0012 01/07/22 0342 01/07/22 0736  GLUCAP 118* 143* 109* 92 111*   Lipid Profile: Recent Labs    01/07/22 0537  CHOL 122  HDL 52  LDLCALC 58  TRIG 62  CHOLHDL 2.3   Thyroid Function Tests: Recent Labs    01/06/22 1522  TSH 1.075   Anemia Panel: Recent Labs    01/06/22 1520  VITAMINB12 482   Sepsis Labs: No results for input(s): "PROCALCITON", "LATICACIDVEN" in the last 168 hours.  Recent Results (from the past 240 hour(s))  Resp Panel by RT-PCR (Flu A&B, Covid) Anterior Nasal Swab     Status: None   Collection Time: 01/06/22  4:31 PM   Specimen: Anterior Nasal Swab  Result Value Ref Range Status   SARS Coronavirus 2 by RT PCR NEGATIVE NEGATIVE Final    Comment: (NOTE) SARS-CoV-2 target nucleic acids are NOT DETECTED.  The SARS-CoV-2 RNA is generally detectable in upper respiratory specimens during the acute  phase of infection. The lowest concentration of SARS-CoV-2 viral copies this assay can detect is 138 copies/mL. A negative result does not preclude SARS-Cov-2 infection and should not be used as the sole basis for treatment or other patient management decisions. A negative result may occur with  improper specimen collection/handling, submission of specimen other than nasopharyngeal swab, presence of viral mutation(s) within the areas targeted by this assay, and inadequate number of viral copies(<138 copies/mL). A negative result must be combined with clinical observations, patient history, and epidemiological information. The expected result is Negative.  Fact Sheet for Patients:  EntrepreneurPulse.com.au  Fact Sheet for Healthcare Providers:  IncredibleEmployment.be  This test is no t yet approved or cleared by the [place] FDA and  has been authorized for detection and/or diagnosis of  SARS-CoV-2 by FDA under an Emergency Use Authorization (EUA). This EUA will remain  in effect (meaning this test can be used) for the duration of the COVID-19 declaration under Section 564(b)(1) of the Act, 21 U.S.C.section 360bbb-3(b)(1), unless the authorization is terminated  or revoked sooner.       Influenza A by PCR NEGATIVE NEGATIVE Final   Influenza B by PCR NEGATIVE NEGATIVE Final    Comment: (NOTE) The Xpert Xpress SARS-CoV-2/FLU/RSV plus assay is intended as an aid in the diagnosis of influenza from Nasopharyngeal swab specimens and should not be used as a sole basis for treatment. Nasal washings and aspirates are unacceptable for Xpert Xpress SARS-CoV-2/FLU/RSV testing.  Fact Sheet for Patients: BloggerCourse.com  Fact Sheet for Healthcare Providers: SeriousBroker.it  This test is not yet approved or cleared by the [place] FDA and has been authorized for detection and/or diagnosis of  SARS-CoV-2 by FDA under an Emergency Use Authorization (EUA). This EUA will remain in effect (meaning this test can be used) for the duration of the COVID-19 declaration under Section 564(b)(1) of the Act, 21 U.S.C. section 360bbb-3(b)(1), unless the authorization is terminated or revoked.  Performed at [facility], [address], [place], [place] [address]          Radiology Studies: EEG adult  Result Date: 02-03-2022 Charlsie Quest, MD     02-03-2022  9:11 AM Patient Name: Malin Sambrano MRN: [id number] Epilepsy Attending: Charlsie Quest Referring Physician/Provider: Charlsie Quest, MD Date: 01/06/2022 Duration: 24.16 mins Patient history: 81yo F with ams. EEG to evaluate for seizure. Level of alertness: Awake, asleep AEDs during EEG study: None Technical aspects: This EEG study was done with scalp electrodes positioned according to the 10-20 International system of electrode placement. Electrical activity was reviewed with band pass filter of 1-70Hz , sensitivity of 7 uV/mm, display speed of 28mm/sec with a 60Hz  notched filter applied as appropriate. EEG data were recorded continuously and digitally stored.  Video monitoring was available and reviewed as appropriate. Description: The posterior dominant rhythm consists of 8 Hz activity of moderate voltage (25-35 uV) seen predominantly in posterior head regions, symmetric and reactive to eye opening and eye closing. Sleep was characterized by vertex waves, sleep spindles (12 to 14 Hz), maximal frontocentral region. EEG showed intermittent generalized 3 to 6 Hz theta-delta slowing. Hyperventilation and photic stimulation were not performed.   ABNORMALITY - Intermittent slow, generalized IMPRESSION: This study is suggestive of mild diffuse encephalopathy, nonspecific etiology. No seizures or epileptiform discharges were seen throughout the recording.   DG Abd 1 View  Result Date: 01/06/2022 CLINICAL DATA:  Nausea and  vomiting. EXAM: ABDOMEN - 1 VIEW COMPARISON:  None Available. FINDINGS: No bowel dilatation or evidence of obstruction. Small volume of formed stool in the right colon. There is excreted IV contrast within the urinary bladder from prior head and neck CTA. Surgical clips in the right upper quadrant typical of cholecystectomy. The lung bases are clear. Surgical hardware in the lumbosacral spine. IMPRESSION: Normal bowel gas pattern.  No obstruction. Electronically Signed   By: 03/08/2022 M.D.   On: 01/06/2022 21:36   MR BRAIN WO CONTRAST  Result Date: 01/06/2022 CLINICAL DATA:  Altered mental status EXAM: MRI HEAD WITHOUT CONTRAST TECHNIQUE: Multiplanar, multiecho pulse sequences of the brain and surrounding structures were obtained without intravenous contrast. COMPARISON:  None Available. FINDINGS: Brain: No acute infarct, mass effect or extra-axial collection. No acute or chronic hemorrhage. There is multifocal  hyperintense T2-weighted signal within the white matter. Generalized volume loss. The midline structures are normal. Vascular: Major flow voids are preserved. Skull and upper cervical spine: Normal calvarium and skull base. Visualized upper cervical spine and soft tissues are normal. Sinuses/Orbits:Left mastoid effusion. Paranasal sinuses are clear. Normal orbits. IMPRESSION: 1. No acute intracranial abnormality. 2. 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 3. Left mastoid effusion. Electronically Signed   By: Ulyses Jarred M.D.   On: 01/06/2022 20:28   CT ANGIO HEAD NECK W WO CM W PERF (CODE STROKE)  Result Date: 01/06/2022 CLINICAL DATA:  Left-sided weakness and possible right-sided gaze preference. EXAM: CT ANGIOGRAPHY HEAD AND NECK CT PERFUSION BRAIN TECHNIQUE: Multidetector CT imaging of the head and neck was performed using the standard protocol during bolus administration of intravenous contrast. Multiplanar CT image reconstructions and MIPs were obtained to evaluate the  vascular anatomy. Carotid stenosis measurements (when applicable) are obtained utilizing NASCET criteria, using the distal internal carotid diameter as the denominator. Multiphase CT imaging of the brain was performed following IV bolus contrast injection. Subsequent parametric perfusion maps were calculated using RAPID software. RADIATION DOSE REDUCTION: This exam was performed according to the departmental dose-optimization program which includes automated exposure control, adjustment of the mA and/or kV according to patient size and/or use of iterative reconstruction technique. CONTRAST:  153mL OMNIPAQUE IOHEXOL 350 MG/ML SOLN COMPARISON:  Same-day noncontrast CT head. Soft tissue neck CT 03/02/2013 FINDINGS: CTA NECK FINDINGS Aortic arch: The imaged aortic arch is normal. The origin of the brachiocephalic artery is not included within the field of view. The origin of the left subclavian artery is patent with mild stenosis due to calcified plaque. The subclavian arteries are otherwise patent to the level imaged. Right carotid system: The right common carotid artery is patent with scattered calcified plaque resulting in less than 50% stenosis. There is calcified plaque at the bifurcation resulting in less than 50% stenosis. The distal right internal carotid artery is patent. The right external carotid artery is patent with mild-to-moderate stenosis proximally. There is no evidence of dissection or aneurysm. Left carotid system: The left common carotid artery is patent with scattered calcified plaque resulting in less than 50% stenosis. There is minimal plaque at the bifurcation without significant stenosis. The distal left internal carotid artery is widely patent. The left external carotid artery is patent. There is no dissection or aneurysm. Vertebral arteries: The left vertebral artery is dominant, a normal variant. A portion of the distal right V2 segment is suboptimally evaluated due to streak artifact from  cervical spine hardware. Within this confine, the vertebral arteries are patent, without hemodynamically significant stenosis or occlusion. There is no dissection or aneurysm. Skeleton: There is no acute osseous abnormality or suspicious osseous lesion. Postsurgical changes reflecting posterior instrumented fusion from C2 through T1 are noted. There is no evidence of hardware related complication. There is no visible canal hematoma. Other neck: The soft tissues of the neck are unremarkable. Upper chest: The imaged lung apices are clear. Review of the MIP images confirms the above findings CTA HEAD FINDINGS Anterior circulation: The intracranial ICAs are patent with mild calcified plaque but no hemodynamically significant stenosis or occlusion. The bilateral MCAs are patent. The bilateral ACAs are patent. The anterior communicating artery is normal. There is no aneurysm or AVM. Posterior circulation: The right V4 segment is hypoplastic/absent after the PICA origin, likely a developmental variant and similar in appearance to the prior CT neck from 2014. The dominant left  V4 segment is patent with focal calcified plaque resulting in mild-to-moderate stenosis. The basilar artery is patent. The major cerebellar arteries appear patent. The bilateral PCAs are patent with mild irregularity and narrowing of the left P2 segment. Bilateral posterior communicating arteries are identified. There is no aneurysm or AVM. Venous sinuses: Patent. Anatomic variants: As above. Review of the MIP images confirms the above findings CT Brain Perfusion Findings: CBF (<30%) Volume: 79mL Perfusion (Tmax>6.0s) volume: 29mL Mismatch Volume: 61mL Infarction Location:N/a IMPRESSION: 1. No emergent large vessel occlusion. 2. No infarct core or penumbra identified on CT perfusion. 3. Calcified plaque of the bilateral carotid bifurcations right worse than left without hemodynamically significant stenosis or occlusion. Patent vertebral arteries. 4. Mild  intracranial atherosclerotic disease with no proximal high-grade stenosis or occlusion. Electronically Signed   By: Lesia Hausen M.D.   On: 01/06/2022 16:04   CT HEAD CODE STROKE WO CONTRAST  Result Date: 01/06/2022 CLINICAL DATA:  Code stroke. Neuro deficit, acute, stroke suspected. EXAM: CT HEAD WITHOUT CONTRAST TECHNIQUE: Contiguous axial images were obtained from the base of the skull through the vertex without intravenous contrast. RADIATION DOSE REDUCTION: This exam was performed according to the departmental dose-optimization program which includes automated exposure control, adjustment of the mA and/or kV according to patient size and/or use of iterative reconstruction technique. COMPARISON:  01/03/2020 FINDINGS: Brain: Chronic small-vessel ischemic change affects the pons. No focal cerebellar insult. Chronic small-vessel ischemic changes affect the cerebral hemispheric white matter. Question mild loss of gray-white differentiation at the deep insula on the left. This is not definite. No evidence of hemorrhage, hydrocephalus or extra-axial collection. Vascular: There is atherosclerotic calcification of the major vessels at the base of the brain. Skull: Negative Sinuses/Orbits: Clear/normal Other: None ASPECTS ([place] Stroke Program Early CT Score) - Ganglionic level infarction (caudate, lentiform nuclei, internal capsule, insula, M1-M3 cortex): 6 - Supraganglionic infarction (M4-M6 cortex): 3 Total score (0-10 with 10 being normal): 9 IMPRESSION: 1. 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 Question small area loss of gray-white differentiation at the deep insula on the left, not definite. 2. ASPECTS is 9, possibly. 3. These results were communicated to Dr. Selina Cooley at 3:37 pm on 01/06/2022 by text page via the [facility] messaging system. Electronically Signed   By: Paulina Fusi M.D.   On: 01/06/2022 15:39        Scheduled Meds:   stroke: early stages of recovery book    Does not apply Once   aspirin  81 mg Oral Daily   Or   aspirin  300 mg Rectal Daily   DULoxetine  60 mg Oral Daily   heparin  5,000 Units Subcutaneous Q8H   insulin aspart  0-9 Units Subcutaneous Q4H   simvastatin  10 mg Oral QPM   thiamine (VITAMIN B1) injection  100 mg Intravenous Daily   Or   thiamine  100 mg Oral Daily   Continuous Infusions:   LOS: 0 days    Time spent: 35 minutes    Dorcas Carrow, MD [facility] Pager [phone]

## 2022-01-08 DIAGNOSIS — G9340 Encephalopathy, unspecified: Secondary | ICD-10-CM | POA: Diagnosis not present

## 2022-01-08 LAB — GLUCOSE, CAPILLARY
Glucose-Capillary: 100 mg/dL — ABNORMAL HIGH (ref 70–99)
Glucose-Capillary: 103 mg/dL — ABNORMAL HIGH (ref 70–99)
Glucose-Capillary: 137 mg/dL — ABNORMAL HIGH (ref 70–99)
Glucose-Capillary: 142 mg/dL — ABNORMAL HIGH (ref 70–99)
Glucose-Capillary: 149 mg/dL — ABNORMAL HIGH (ref 70–99)

## 2022-01-08 NOTE — Progress Notes (Signed)
PROGRESS NOTE    Sydney Gilbert  CHE:[id number] DOB: [birth year] DOA: 01/06/2022 PCP: [facility]    Brief Narrative:  81 year old female with history of type 2 diabetes, hypertension, hyperlipidemia, anemia, GERD, depression, mild cognitive impairment from home presented from daycare where she had episode of vomiting and was more confused.  Patient tells me that she was just not feeling well, she was nauseous and threw up.  Patient was noted to be more lethargic so EMS was called and brought to the ER.  Initially in the ER, she was aphasic and confused and appeared to have bilateral lower extremity weakness. Hemodynamically stable.  Was on room air.  Irregularity was normal.  TSH was normal.  Urine was negative.  COVID-19 and influenza negative.  UDS negative.  Skeletal survey negative.  Admitted for further work-up and extreme weakness.  8/4.  Medically stable.  Remains in the hospital with extreme debility and needs rehab.  Social worker is trying to coordinate with [facility] program.   Assessment & Plan:   Acute metabolic encephalopathy, TIA, multifactorial with dehydration and AKI. Initially suspected possible left-sided neglect and confusion, currently normalized. CT head with small area of loss of gray-white differentiation at deep insula of the left. CTA of the head and neck with perfusion studies, negative for large vessel occlusion infarct or penumbra. MRI brain with no acute infarct. EEG without any epileptiform discharges. Chest x-ray and urinalysis along with COVID-19 and influenza negative. Neurologically stabilized.  Extremely debilitated and weak. PT OT and speech, refer to SNF.  Acute kidney injury: Treated with IV fluids.  Improved.  Essential hypertension: Stable.  Depression: On Cymbalta.  Hyperlipidemia: On simvastatin.  Debility: Fairly debilitated.  Needs skilled nursing rehab.  Medically stable.  Enrolled in [facility] program.   DVT  prophylaxis: heparin injection 5,000 Units Start: 01/06/22 2200   Code Status: Full code Family Communication: None Disposition Plan: Status is: Observation The patient remains OBS appropriate and will d/c before 2 midnights. Medically stable.   Consultants:  Neurology  Procedures:  None  Antimicrobials:  None   Subjective: Seen and examined.  Denies any complaints.  She agrees she has hard time walking and needs rehab.  Wants to go home to take care of her sister.  Objective: Vitals:   01/08/22 0009 01/08/22 0357 01/08/22 0700 01/08/22 0752  BP: 136/67 (!) 159/68 (!) 169/68 (!) 152/79  Pulse: 79 80 88 83  Resp: (!) 24 14 (!) 22 14  Temp: (!) 97.5 F (36.4 C) 98.5 F (36.9 C) 98.7 F (37.1 C) 98.4 F (36.9 C)  TempSrc: Oral Oral Oral Oral  SpO2:   98% 100%  Weight:      Height:        Intake/Output Summary (Last 24 hours) at 01/08/2022 1115 Last data filed at 01/08/2022 1000 Gross per 24 hour  Intake 480 ml  Output --  Net 480 ml    Filed Weights   01/06/22 1500 01/07/22 1459  Weight: 70.6 kg 60.3 kg    Examination:  General exam: Appears calm and comfortable at rest. Frail and debilitated.   Respiratory system: Bilateral clear. Cardiovascular system: S1 & S2 heard, RRR. No pedal edema. Central nervous system: Alert and oriented. No focal neurological deficits.  Generalized weakness.    Data Reviewed: I have personally reviewed following labs and imaging studies  CBC: Recent Labs  Lab 01/06/22 1522 01/06/22 1535 01/06/22 1536 01/07/22 1110  WBC 7.3  --   --  5.6  NEUTROABS 4.4  --   --  3.4  HGB 10.2* 11.6* 11.2* 11.1*  HCT 32.5* 34.0* 33.0* 35.1*  MCV 86.7  --   --  85.2  PLT 201  --   --  204    Basic Metabolic Panel: Recent Labs  Lab 01/06/22 1522 01/06/22 1535 01/06/22 1536 01/07/22 1110  NA 141 141 141 142  K 4.0 4.0 4.0 3.7  CL 108 109  --  110  CO2 20*  --   --  25  GLUCOSE 124* 119*  --  115*  BUN 23 24*  --  14   CREATININE 1.46* 1.40*  --  0.94  CALCIUM 9.7  --   --  9.9  MG  --   --   --  1.7  PHOS  --   --   --  3.5    GFR: Estimated Creatinine Clearance: 39.5 mL/min (by C-G formula based on SCr of 0.94 mg/dL). Liver Function Tests: Recent Labs  Lab 01/06/22 1522 01/07/22 1110  AST 35 23  ALT 16 14  ALKPHOS 45 49  BILITOT 0.5 0.8  PROT 7.3 7.3  ALBUMIN 3.9 3.9    No results for input(s): "LIPASE", "AMYLASE" in the last 168 hours. Recent Labs  Lab 01/06/22 1520  AMMONIA 19    Coagulation Profile: Recent Labs  Lab 01/06/22 1522  INR 1.0    Cardiac Enzymes: No results for input(s): "CKTOTAL", "CKMB", "CKMBINDEX", "TROPONINI" in the last 168 hours. BNP (last 3 results) No results for input(s): "PROBNP" in the last 8760 hours. HbA1C: Recent Labs    01/07/22 0537  HGBA1C 5.7*    CBG: Recent Labs  Lab 01/07/22 2013 01/08/22 0013 01/08/22 0346 01/08/22 0401 01/08/22 0759  GLUCAP 202* 103* 100* 142* 137*    Lipid Profile: Recent Labs    01/07/22 0537  CHOL 122  HDL 52  LDLCALC 58  TRIG 62  CHOLHDL 2.3    Thyroid Function Tests: Recent Labs    01/06/22 1522  TSH 1.075    Anemia Panel: Recent Labs    01/06/22 1520  VITAMINB12 482    Sepsis Labs: No results for input(s): "PROCALCITON", "LATICACIDVEN" in the last 168 hours.  Recent Results (from the past 240 hour(s))  Resp Panel by RT-PCR (Flu A&B, Covid) Anterior Nasal Swab     Status: None   Collection Time: 01/06/22  4:31 PM   Specimen: Anterior Nasal Swab  Result Value Ref Range Status   SARS Coronavirus 2 by RT PCR NEGATIVE NEGATIVE Final    Comment: (NOTE) SARS-CoV-2 target nucleic acids are NOT DETECTED.  The SARS-CoV-2 RNA is generally detectable in upper respiratory specimens during the acute phase of infection. The lowest concentration of SARS-CoV-2 viral copies this assay can detect is 138 copies/mL. A negative result does not preclude SARS-Cov-2 infection and should not be  used as the sole basis for treatment or other patient management decisions. A negative result may occur with  improper specimen collection/handling, submission of specimen other than nasopharyngeal swab, presence of viral mutation(s) within the areas targeted by this assay, and inadequate number of viral copies(<138 copies/mL). A negative result must be combined with clinical observations, patient history, and epidemiological information. The expected result is Negative.  Fact Sheet for Patients:  BloggerCourse.comhttps://www.fda.gov/media/152166/download  Fact Sheet for Healthcare Providers:  SeriousBroker.ithttps://www.fda.gov/media/152162/download  This test is no t yet approved or cleared by the [place] FDA and  has been authorized for detection and/or diagnosis of  SARS-CoV-2 by FDA under an Emergency Use Authorization (EUA). This EUA will remain  in effect (meaning this test can be used) for the duration of the COVID-19 declaration under Section 564(b)(1) of the Act, 21 U.S.C.section 360bbb-3(b)(1), unless the authorization is terminated  or revoked sooner.       Influenza A by PCR NEGATIVE NEGATIVE Final   Influenza B by PCR NEGATIVE NEGATIVE Final    Comment: (NOTE) The Xpert Xpress SARS-CoV-2/FLU/RSV plus assay is intended as an aid in the diagnosis of influenza from Nasopharyngeal swab specimens and should not be used as a sole basis for treatment. Nasal washings and aspirates are unacceptable for Xpert Xpress SARS-CoV-2/FLU/RSV testing.  Fact Sheet for Patients: BloggerCourse.com  Fact Sheet for Healthcare Providers: SeriousBroker.it  This test is not yet approved or cleared by the [place] FDA and has been authorized for detection and/or diagnosis of SARS-CoV-2 by FDA under an Emergency Use Authorization (EUA). This EUA will remain in effect (meaning this test can be used) for the duration of the COVID-19 declaration under Section  564(b)(1) of the Act, 21 U.S.C. section 360bbb-3(b)(1), unless the authorization is terminated or revoked.  Performed at [facility], [address], [place], [place] [address]          Radiology Studies: EEG adult  Result Date: 27-Jan-2022 Charlsie Quest, MD     01-27-22  9:11 AM Patient Name: Sydney Gilbert MRN: [id number] Epilepsy Attending: Charlsie Quest Referring Physician/Provider: Charlsie Quest, MD Date: 01/06/2022 Duration: 24.16 mins Patient history: 81yo F with ams. EEG to evaluate for seizure. Level of alertness: Awake, asleep AEDs during EEG study: None Technical aspects: This EEG study was done with scalp electrodes positioned according to the 10-20 International system of electrode placement. Electrical activity was reviewed with band pass filter of 1-70Hz , sensitivity of 7 uV/mm, display speed of 34mm/sec with a 60Hz  notched filter applied as appropriate. EEG data were recorded continuously and digitally stored.  Video monitoring was available and reviewed as appropriate. Description: The posterior dominant rhythm consists of 8 Hz activity of moderate voltage (25-35 uV) seen predominantly in posterior head regions, symmetric and reactive to eye opening and eye closing. Sleep was characterized by vertex waves, sleep spindles (12 to 14 Hz), maximal frontocentral region. EEG showed intermittent generalized 3 to 6 Hz theta-delta slowing. Hyperventilation and photic stimulation were not performed.   ABNORMALITY - Intermittent slow, generalized IMPRESSION: This study is suggestive of mild diffuse encephalopathy, nonspecific etiology. No seizures or epileptiform discharges were seen throughout the recording.   DG Abd 1 View  Result Date: 01/06/2022 CLINICAL DATA:  Nausea and vomiting. EXAM: ABDOMEN - 1 VIEW COMPARISON:  None Available. FINDINGS: No bowel dilatation or evidence of obstruction. Small volume of formed stool in the right colon. There is excreted IV  contrast within the urinary bladder from prior head and neck CTA. Surgical clips in the right upper quadrant typical of cholecystectomy. The lung bases are clear. Surgical hardware in the lumbosacral spine. IMPRESSION: Normal bowel gas pattern.  No obstruction. Electronically Signed   By: 03/08/2022 M.D.   On: 01/06/2022 21:36   MR BRAIN WO CONTRAST  Result Date: 01/06/2022 CLINICAL DATA:  Altered mental status EXAM: MRI HEAD WITHOUT CONTRAST TECHNIQUE: Multiplanar, multiecho pulse sequences of the brain and surrounding structures were obtained without intravenous contrast. COMPARISON:  None Available. FINDINGS: Brain: No acute infarct, mass effect or extra-axial collection. No acute or chronic hemorrhage. There is multifocal  hyperintense T2-weighted signal within the white matter. Generalized volume loss. The midline structures are normal. Vascular: Major flow voids are preserved. Skull and upper cervical spine: Normal calvarium and skull base. Visualized upper cervical spine and soft tissues are normal. Sinuses/Orbits:Left mastoid effusion. Paranasal sinuses are clear. Normal orbits. IMPRESSION: 1. No acute intracranial abnormality. 2. 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 3. Left mastoid effusion. Electronically Signed   By: Deatra Robinson M.D.   On: 01/06/2022 20:28   CT ANGIO HEAD NECK W WO CM W PERF (CODE STROKE)  Result Date: 01/06/2022 CLINICAL DATA:  Left-sided weakness and possible right-sided gaze preference. EXAM: CT ANGIOGRAPHY HEAD AND NECK CT PERFUSION BRAIN TECHNIQUE: Multidetector CT imaging of the head and neck was performed using the standard protocol during bolus administration of intravenous contrast. Multiplanar CT image reconstructions and MIPs were obtained to evaluate the vascular anatomy. Carotid stenosis measurements (when applicable) are obtained utilizing NASCET criteria, using the distal internal carotid diameter as the denominator. Multiphase CT imaging  of the brain was performed following IV bolus contrast injection. Subsequent parametric perfusion maps were calculated using RAPID software. RADIATION DOSE REDUCTION: This exam was performed according to the departmental dose-optimization program which includes automated exposure control, adjustment of the mA and/or kV according to patient size and/or use of iterative reconstruction technique. CONTRAST:  OMNIPAQUE IOHEXOL 350 MG/ML SOLN COMPARISON:  Same-day noncontrast CT head. Soft tissue neck CT 03/02/2013 FINDINGS: CTA NECK FINDINGS Aortic arch: The imaged aortic arch is normal. The origin of the brachiocephalic artery is not included within the field of view. The origin of the left subclavian artery is patent with mild stenosis due to calcified plaque. The subclavian arteries are otherwise patent to the level imaged. Right carotid system: The right common carotid artery is patent with scattered calcified plaque resulting in less than 50% stenosis. There is calcified plaque at the bifurcation resulting in less than 50% stenosis. The distal right internal carotid artery is patent. The right external carotid artery is patent with mild-to-moderate stenosis proximally. There is no evidence of dissection or aneurysm. Left carotid system: The left common carotid artery is patent with scattered calcified plaque resulting in less than 50% stenosis. There is minimal plaque at the bifurcation without significant stenosis. The distal left internal carotid artery is widely patent. The left external carotid artery is patent. There is no dissection or aneurysm. Vertebral arteries: The left vertebral artery is dominant, a normal variant. A portion of the distal right V2 segment is suboptimally evaluated due to streak artifact from cervical spine hardware. Within this confine, the vertebral arteries are patent, without hemodynamically significant stenosis or occlusion. There is no dissection or aneurysm. Skeleton: There  is no acute osseous abnormality or suspicious osseous lesion. Postsurgical changes reflecting posterior instrumented fusion from C2 through T1 are noted. There is no evidence of hardware related complication. There is no visible canal hematoma. Other neck: The soft tissues of the neck are unremarkable. Upper chest: The imaged lung apices are clear. Review of the MIP images confirms the above findings CTA HEAD FINDINGS Anterior circulation: The intracranial ICAs are patent with mild calcified plaque but no hemodynamically significant stenosis or occlusion. The bilateral MCAs are patent. The bilateral ACAs are patent. The anterior communicating artery is normal. There is no aneurysm or AVM. Posterior circulation: The right V4 segment is hypoplastic/absent after the PICA origin, likely a developmental variant and similar in appearance to the prior CT neck from 2014. The dominant left  V4 segment is patent with focal calcified plaque resulting in mild-to-moderate stenosis. The basilar artery is patent. The major cerebellar arteries appear patent. The bilateral PCAs are patent with mild irregularity and narrowing of the left P2 segment. Bilateral posterior communicating arteries are identified. There is no aneurysm or AVM. Venous sinuses: Patent. Anatomic variants: As above. Review of the MIP images confirms the above findings CT Brain Perfusion Findings: CBF (<30%) Volume: 10mL Perfusion (Tmax>6.0s) volume: 51mL Mismatch Volume: 50mL Infarction Location:N/a IMPRESSION: 1. No emergent large vessel occlusion. 2. No infarct core or penumbra identified on CT perfusion. 3. Calcified plaque of the bilateral carotid bifurcations right worse than left without hemodynamically significant stenosis or occlusion. Patent vertebral arteries. 4. Mild intracranial atherosclerotic disease with no proximal high-grade stenosis or occlusion. Electronically Signed   By: Lesia Hausen M.D.   On: 01/06/2022 16:04   CT HEAD CODE STROKE WO  CONTRAST  Result Date: 01/06/2022 CLINICAL DATA:  Code stroke. Neuro deficit, acute, stroke suspected. EXAM: CT HEAD WITHOUT CONTRAST TECHNIQUE: Contiguous axial images were obtained from the base of the skull through the vertex without intravenous contrast. RADIATION DOSE REDUCTION: This exam was performed according to the departmental dose-optimization program which includes automated exposure control, adjustment of the mA and/or kV according to patient size and/or use of iterative reconstruction technique. COMPARISON:  01/03/2020 FINDINGS: Brain: Chronic small-vessel ischemic change affects the pons. No focal cerebellar insult. Chronic small-vessel ischemic changes affect the cerebral hemispheric white matter. Question mild loss of gray-white differentiation at the deep insula on the left. This is not definite. No evidence of hemorrhage, hydrocephalus or extra-axial collection. Vascular: There is atherosclerotic calcification of the major vessels at the base of the brain. Skull: Negative Sinuses/Orbits: Clear/normal Other: None ASPECTS (Alberta Stroke Program Early CT Score) - Ganglionic level infarction (caudate, lentiform nuclei, internal capsule, insula, M1-M3 cortex): 6 - Supraganglionic infarction (M4-M6 cortex): 3 Total score (0-10 with 10 being normal): 9 IMPRESSION: 1. 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 Question small area loss of gray-white differentiation at the deep insula on the left, not definite. 2. ASPECTS is 9, possibly. 3. These results were communicated to Dr. Selina Cooley at 3:37 pm on 01/06/2022 by text page via the [facility] messaging system. Electronically Signed   By: Paulina Fusi M.D.   On: 01/06/2022 15:39        Scheduled Meds:  aspirin  81 mg Oral Daily   Or   aspirin  300 mg Rectal Daily   DULoxetine  60 mg Oral Daily   heparin  5,000 Units Subcutaneous Q8H   insulin aspart  0-9 Units Subcutaneous Q4H   simvastatin  10 mg Oral QPM    thiamine (VITAMIN B1) injection  100 mg Intravenous Daily   Or   thiamine  100 mg Oral Daily   Continuous Infusions:   LOS: 0 days    Time spent: 35 minutes    Dorcas Carrow, MD [facility] Pager [phone]

## 2022-01-08 NOTE — NC FL2 (Signed)
[place] MEDICAID FL2 LEVEL OF CARE SCREENING TOOL     IDENTIFICATION  Patient Name: Sydney Gilbert Birthdate: Mar 23, 1941 Sex: female Admission Date (Current Location): 01/06/2022  [facility] and [place] Number:  Producer, television/film/video and Address:  [facility] [place], [address] [place], [place] [address]      Provider Number: [id number]  Attending Physician Name and Address:  Dorcas Carrow, MD  Relative Name and Phone Number:       Current Level of Care: SNF Recommended Level of Care: Skilled Nursing Facility Prior Approval Number:    Date Approved/Denied:   PASRR Number: [id number]  Discharge Plan: SNF    Current Diagnoses: Patient Active Problem List   Diagnosis Date Noted   Acute encephalopathy 01/06/2022   AKI (acute kidney injury) (HCC) 01/06/2022   Depression    Facial cellulitis 09/03/2019   Type 2 diabetes mellitus with hyperglycemia (HCC) 09/03/2019   Hypertension associated with diabetes (HCC) 09/03/2019   HLD (hyperlipidemia) 09/03/2019    Orientation RESPIRATION BLADDER Height & Weight     Self, Time, Situation, Place  Normal Incontinent Weight: 132 lb 14.4 oz (60.3 kg) Height:  5\' 3"  (160 cm)  BEHAVIORAL SYMPTOMS/MOOD NEUROLOGICAL BOWEL NUTRITION STATUS      Continent Diet (See discharge summary.)  AMBULATORY STATUS COMMUNICATION OF NEEDS Skin   Extensive Assist   Normal                       Personal Care Assistance Level of Assistance  Bathing, Feeding, Dressing, Total care Bathing Assistance: Maximum assistance Feeding assistance: Maximum assistance Dressing Assistance: Maximum assistance Total Care Assistance: Maximum assistance   Functional Limitations Info  Sight, Hearing Sight Info: Adequate Hearing Info: Adequate      SPECIAL CARE FACTORS FREQUENCY  PT (By licensed PT), OT (By licensed OT)     PT Frequency: 5x/week OT Frequency: 5x/week            Contractures Contractures Info: Not present     Additional Factors Info  Code Status, Allergies, Insulin Sliding Scale Code Status Info: Full Code Allergies Info: Metformin and related, Penicillins, Varenicline   Insulin Sliding Scale Info: Insulin aspart injection 0-9 Units every four hours       Current Medications (01/08/2022):  This is the current hospital active medication list Current Facility-Administered Medications  Medication Dose Route Frequency Provider Last Rate Last Admin   acetaminophen (TYLENOL) tablet 650 mg  650 mg Oral Q4H PRN 03/10/2022, MD       Or   acetaminophen (TYLENOL) 160 MG/5ML solution 650 mg  650 mg Per Tube Q4H PRN Charlsie Quest, MD       Or   acetaminophen (TYLENOL) suppository 650 mg  650 mg Rectal Q4H PRN Charlsie Quest, MD       aspirin chewable tablet 81 mg  81 mg Oral Daily Charlsie Quest, MD   81 mg at 01/08/22 03/10/22   Or   aspirin suppository 300 mg  300 mg Rectal Daily [id number], MD       DULoxetine (CYMBALTA) DR capsule 60 mg  60 mg Oral Daily Jefferson Fuel R, MD   60 mg at 01/08/22 0908   heparin injection 5,000 Units  5,000 Units Subcutaneous Q8H 03/10/22 R, MD   5,000 Units at 01/08/22 0616   insulin aspart (novoLOG) injection 0-9 Units  0-9 Units Subcutaneous Q4H 03/10/22, MD   1 Units  at 01/08/22 0908   labetalol (NORMODYNE) injection 10 mg  10 mg Intravenous Q4H PRN Charlsie Quest, MD       senna-docusate (Senokot-S) tablet 1 tablet  1 tablet Oral QHS PRN Charlsie Quest, MD       simvastatin (ZOCOR) tablet 10 mg  10 mg Oral QPM Darreld Mclean R, MD   10 mg at 01/07/22 1749   thiamine (VITAMIN B1) injection 100 mg  100 mg Intravenous Daily Jefferson Fuel, MD   100 mg at 01/07/22 1914   Or   thiamine (VITAMIN B1) tablet 100 mg  100 mg Oral Daily Jefferson Fuel, MD   100 mg at 01/08/22 0908     Discharge Medications: Please see discharge summary for a list of discharge medications.  Relevant Imaging Results:  Relevant Lab Results:   Additional  Information SSN: [id number]  Mearl Latin, LCSW

## 2022-01-08 NOTE — TOC Initial Note (Signed)
Transition of Care [facility]) - Initial/Assessment Note    Patient Details  Name: Sydney Gilbert MRN: [id number] Date of Birth: 07-31-1940  Transition of Care [facility]) CM/SW Contact:    Mearl Latin, LCSW Phone Number: 01/08/2022, 12:40 PM  Clinical Narrative:                 CSW received return call from [place] with [facility]. She stated they would approve about a week of rehab for patient at one of their contracted SNFs. [facility] does not have beds available. [facility] does. CSW spoke with patient, sister, and [facility] caregiver at bedside with out of state daughter on speaker phone. Everyone in the room is in agreement with SNF rehab given patient's current physical needs and fall risk. Patient reports preference for [facility] over [facility]. CSW alerted [facility] and is awaiting acceptance for discharge today.   Skilled Nursing Rehab Facilities-   [email or web address]   Ratings out of 5 stars (the highest)   Name Address  Phone # Quality Care Staffing Health Inspection Overall  [facility] [address] [place] [phone] 5 5 2 4   [facility]  [address], [place] [phone] 4 2 5 5   [facility] [address] [place], [address] [place] 1 1 1 1   [facility] [address] 2 2 4 4   [facility] [address] [phone] 1 1 2 1   [facility] [address], [place] [phone] 3 2 4 4   [facility] [address] [place] 5 1 3 3   [facility] [address], [facility] [place] 5 2 3 4   [address] ([place]) [address] [facility] 4 1 2 1   [facility] [phone] [address], [phone] 4 1 1 1   [facility] [address] [facility] [phone] 3 1 2 1   [facility] [address], [place]in[place] Otto [phone] 4 1 1 1   [address] [facility] 4 2 4 4           [facility] [address], [facility] [place]      [facility] [address] [phone] 4 1 3 2   [facility] [address], [address] [phone] 3 1 5 4   [facility] [place] [facility], [address] [place] 1 1 1 1   [facility] [address], [facility] [phone] 2 2 3 3           [address] (no [facility]) [address], [place] [phone] 5 5 5 5   [facility] (No Humana) [address] [phone] [address] [place], [place] 4 1 4 3   [facility] (No UHC) [address] [place], [place] [phone] 5 5 5 5   [facility] [address] [employer] [phone] 2 3 5 5   [facility] [address], [address] [facility] 1 1 2 1   [facility] [address], [phone] 3 1 1 1   [facility] [address] [place] [phone] 5 2 4 5   [facility]  [address], [place] 2 2 1 1   [facility] [address], [facility] [place] 3 2 1 1   [facility] [address] [place], [address] [place] 2 2 2 2           [facility] [address] [place] [phone] 1 1 1 1   [facility] [address], [phone]  [phone] 2 4 2  2  [facility] [place] [address], Rosalita Levan [phone] 4 2 3 3   [facility] [place] [address] [place] [phone] 2 1 1 1   [facility] (No Humana) [address] [place], [place] 2 2 3 3   [facility] [address], [phone] 2 1 1 1           [facility] [address] [place], [facility] 5 4 5 5   [facility])  [address] [phone] 2 1 2 1   [facility]) [address] [place], [place] [phone] 3 1 4 3   [facility] [place] [address] [place] 3 3 4 4   [address] [place], [place] 2 3 1 1   [facility])  [address] [place] [phone] 2 1 4 3      Expected Discharge Plan: Skilled Nursing Facility Barriers to Discharge: SNF Pending bed offer   Patient Goals and CMS Choice Patient states their goals for this hospitalization and ongoing recovery are:: Rehab CMS Medicare.gov Compare Post Acute Care list provided to:: Patient Choice offered to / list presented to : Patient, Adult Children, Sibling  Expected Discharge Plan and Services Expected Discharge Plan: Skilled Nursing Facility In-house Referral: Clinical Social Work   Post Acute Care Choice: Skilled Nursing Facility Living arrangements for the past 2 months: Single Family Home                                      Prior Living Arrangements/Services Living arrangements for the past 2 months: Single Family Home Lives with:: Siblings Patient language and need for interpreter reviewed:: Yes Do you feel safe going back to the place where you live?: Yes      Need for Family Participation in Patient Care: Yes (Comment) Care giver support system in place?: Yes (comment)   Criminal Activity/Legal Involvement Pertinent to Current Situation/Hospitalization: No - Comment as needed  Activities of Daily Living      Permission Sought/Granted Permission sought to share information with : Facility Lewayne Bunting, Family Supports Permission granted to share information with : Yes, Verbal Permission Granted  Share Information with NAME: [facility]  Permission granted to share info w AGENCY: SNFs/[facility]  Permission granted to share info w Relationship: Sister  Permission granted to share info w Contact Information: [phone]  Emotional Assessment Appearance:: Appears stated age Attitude/Demeanor/Rapport: Engaged Affect (typically observed): Accepting, Appropriate, Pleasant Orientation: : Oriented to Self, Oriented to Place, Oriented to  Time, Oriented to Situation Alcohol / Substance Use: Not  Applicable Psych Involvement: No (comment)  Admission diagnosis:  Acute encephalopathy [G93.40] Acute kidney injury (HCC) [N17.9] Altered mental status, unspecified altered mental status type [R41.82] Patient Active Problem List   Diagnosis Date Noted   Acute encephalopathy 01/06/2022   AKI (acute kidney injury) (HCC) 01/06/2022   Depression    Facial cellulitis 09/03/2019   Type 2 diabetes mellitus with hyperglycemia (HCC) 09/03/2019   Hypertension associated with diabetes (HCC) 09/03/2019   HLD (hyperlipidemia) 09/03/2019   PCP:  [facility]:   [facility] [phone] - [place] - [address] AT [facility] [address] [place] 03/08/2022 Phone: [phone] Fax: [phone]  [facility] 09/05/2019 - [place], [place] - [address] [place] [address] Phone: [phone] Fax: [phone]     Social Determinants of Health (SDOH) Interventions    Readmission Risk Interventions     No data to display

## 2022-01-08 NOTE — Plan of Care (Signed)
  Problem: Health Behavior/Discharge Planning: Goal: Ability to manage health-related needs will improve Outcome: Progressing   Problem: Clinical Measurements: Goal: Ability to maintain clinical measurements within normal limits will improve Outcome: Progressing   Problem: Clinical Measurements: Goal: Will remain free from infection Outcome: Progressing   Problem: Clinical Measurements: Goal: Diagnostic test results will improve Outcome: Progressing   Problem: Activity: Goal: Risk for activity intolerance will decrease Outcome: Progressing   Problem: Nutrition: Goal: Adequate nutrition will be maintained Outcome: Progressing   Problem: Coping: Goal: Level of anxiety will decrease Outcome: Progressing   

## 2022-01-08 NOTE — Discharge Summary (Signed)
Physician Discharge Summary  Raja Caputi HUD:[id number] DOB: 07-Apr-1941 DOA: 01/06/2022  PCP: [facility]  Admit date: 01/06/2022 Discharge date: 01/08/2022  Admitted From: Home via adult daycare Disposition: Skilled nursing facility  Recommendations for Outpatient Follow-up:  Follow up with PCP in 1-2 weeks   Discharge Condition: Fair CODE STATUS: Full code Diet recommendation: Low-salt diet, low-carb diet  Discharge summary: 81 year old female with history of type 2 diabetes, hypertension, hyperlipidemia, anemia, GERD, depression, mild cognitive impairment from home presented from daycare where she had episode of vomiting and was more confused.  Patient tells me that she was just not feeling well, she was nauseous and threw up.  Patient was noted to be more lethargic so EMS was called and brought to the ER.  Initially in the ER, she was aphasic and confused and appeared to have bilateral lower extremity weakness. Hemodynamically stable.  Was on room air.  TSH was normal.  Urine was negative.  COVID-19 and influenza negative.  UDS negative.  Skeletal survey negative.  Admitted for further work-up and extreme weakness.  Assessment & plan of care:   Acute metabolic encephalopathy, TIA, multifactorial with dehydration and AKI. Initially suspected possible left-sided neglect and confusion, currently normalized. CT head with small area of loss of gray-white differentiation at deep insula of the left. CTA of the head and neck with perfusion studies, negative for large vessel occlusion infarct or penumbra. MRI brain with no acute infarct. EEG without any epileptiform discharges. Chest x-ray and urinalysis along with COVID-19 and influenza negative. Neurologically stabilized.  Extremely debilitated and weak. PT OT and speech, refer to SNF. Avoid polypharmacy.  Discontinue narcotics and central muscle relaxants.  Managed with Tylenol and NSAIDs.   Acute  kidney injury: Treated with IV fluids.  Improved.  Normalized.   Essential hypertension: Stable.  Elevated today.  Resume home doses of metoprolol, lisinopril and amlodipine.   Depression: On Cymbalta.   Hyperlipidemia: On simvastatin.   Debility: Fairly debilitated.  Needs skilled nursing rehab.  Medically stable.  Transfer to SNF today.    Discharge Diagnoses:  Principal Problem:   Acute encephalopathy Active Problems:   AKI (acute kidney injury) (HCC)   Hypertension associated with diabetes (HCC)   Type 2 diabetes mellitus with hyperglycemia (HCC)   HLD (hyperlipidemia)   Depression    Discharge Instructions  Discharge Instructions     Diet - low sodium heart healthy   Complete by: As directed    Increase activity slowly   Complete by: As directed       Allergies as of 01/08/2022       Reactions   Metformin And Related    Penicillins Rash   Has patient had a PCN reaction causing immediate rash, facial/tongue/throat swelling, SOB or lightheadedness with hypotension: Yes Has patient had a PCN reaction causing severe rash involving mucus membranes or skin necrosis: No Has patient had a PCN reaction that required hospitalization: NO Has patient had a PCN reaction occurring within the last 10 years: No If all of the above answers are "NO", then may proceed with Cephalosporin use.   Varenicline Itching        Medication List     STOP taking these medications    baclofen 20 MG tablet Commonly known as: LIORESAL   oxycodone-acetaminophen 2.5-325 MG tablet Commonly known as: PERCOCET   vitamin A & D ointment       TAKE these medications    acetaminophen 500 MG tablet Commonly  known as: TYLENOL Take 1,000 mg by mouth 2 (two) times daily as needed for moderate pain.   acetaminophen 325 MG tablet Commonly known as: TYLENOL Take 650 mg by mouth See admin instructions. During [facility] days   amLODipine 10 MG tablet Commonly known as: NORVASC Take 10  mg by mouth daily.   BIOFREEZE EX Apply 1 application topically 2 (two) times daily as needed (For aches and pain).   DULoxetine 60 MG capsule Commonly known as: CYMBALTA Take 60 mg by mouth daily. (DO NOT CRUSH)   EUCERIN ECZEMA RELIEF EX Apply 1 Application topically 2 (two) times daily.   famotidine 20 MG tablet Commonly known as: PEPCID Take 20 mg by mouth 2 (two) times daily. AS NEEDED FOR HEARTBURN   ferrous gluconate 324 MG tablet Commonly known as: FERGON Take 324 mg by mouth daily with breakfast.   glipiZIDE 2.5 MG 24 hr tablet Commonly known as: GLUCOTROL XL Take 2.5 mg by mouth daily with breakfast.   hydrochlorothiazide 25 MG tablet Commonly known as: HYDRODIURIL Take 25 mg by mouth daily.   hydrocortisone 2.5 % cream Apply 1 application topically 2 (two) times daily as needed (For itcing,rash on face).   hydroquinone 4 % cream Apply 1 Application topically 2 (two) times daily.   lidocaine 5 % Commonly known as: Lidoderm Place 1 patch onto the skin daily. Apply over the left middle back where your rib is broken. Remove & Discard patch within 12 hours or as directed by MD   lisinopril 40 MG tablet Commonly known as: ZESTRIL Take 40 mg by mouth daily. FOR HTN   meloxicam 15 MG tablet Commonly known as: MOBIC Take 15 mg by mouth daily.   metoprolol succinate 100 MG 24 hr tablet Commonly known as: TOPROL-XL Take 100 mg by mouth daily.   polyethylene glycol 17 g packet Commonly known as: MIRALAX / GLYCOLAX Take 17 g by mouth daily as needed for mild constipation.   polyvinyl alcohol 1.4 % ophthalmic solution Commonly known as: LIQUIFILM TEARS Place 1 drop into both eyes in the morning, at noon, in the evening, and at bedtime.   pyrithione zinc 1 % shampoo Commonly known as: HEAD AND SHOULDERS Apply topically See admin instructions. Use as directed   Restasis MultiDose 0.05 % ophthalmic emulsion Generic drug: cycloSPORINE Place 1 drop into both  eyes every 12 (twelve) hours. Dry eyes   SALONPAS ARTHRITIS PAIN RELIEF EX Apply 1 application topically 2 (two) times daily as needed (For pain management).   senna-docusate 8.6-50 MG tablet Commonly known as: Senokot-S Take 2 tablets by mouth at bedtime.   simethicone 125 MG chewable tablet Commonly known as: MYLICON Chew 125 mg by mouth 3 (three) times daily as needed (gas pains).   simvastatin 10 MG tablet Commonly known as: ZOCOR Take 10 mg by mouth every evening.   SYSTANE COMPLETE OP Place 1 drop into both eyes in the morning, at noon, in the evening, and at bedtime.   triamcinolone cream 0.1 % Commonly known as: KENALOG Apply 1 Application topically daily as needed (rash).   Vitamin D3 50 MCG (2000 UT) capsule Take 2,000 Units by mouth daily.        Contact information for after-discharge care     Destination     [facility] Preferred SNF .   Service: Skilled Nursing Contact information: [address] [place] [address] [phone]  Allergies  Allergen Reactions   Metformin And Related    Penicillins Rash    Has patient had a PCN reaction causing immediate rash, facial/tongue/throat swelling, SOB or lightheadedness with hypotension: Yes Has patient had a PCN reaction causing severe rash involving mucus membranes or skin necrosis: No Has patient had a PCN reaction that required hospitalization: NO Has patient had a PCN reaction occurring within the last 10 years: No If all of the above answers are "NO", then may proceed with Cephalosporin use.    Varenicline Itching    Consultations: Neurology   Procedures/Studies: EEG adult  Result Date: 12-Jan-2022 Charlsie Quest, MD     Jan 12, 2022  9:11 AM Patient Name: Sydney Gilbert MRN: [id number] Epilepsy Attending: Charlsie Quest Referring Physician/Provider: Charlsie Quest, MD Date: 01/06/2022 Duration: 24.16 mins Patient history: 81yo F  with ams. EEG to evaluate for seizure. Level of alertness: Awake, asleep AEDs during EEG study: None Technical aspects: This EEG study was done with scalp electrodes positioned according to the 10-20 International system of electrode placement. Electrical activity was reviewed with band pass filter of 1-70Hz , sensitivity of 7 uV/mm, display speed of 25mm/sec with a  notched filter applied as appropriate. EEG data were recorded continuously and digitally stored.  Video monitoring was available and reviewed as appropriate. Description: The posterior dominant rhythm consists of 8 Hz activity of moderate voltage (25-35 uV) seen predominantly in posterior head regions, symmetric and reactive to eye opening and eye closing. Sleep was characterized by vertex waves, sleep spindles (12 to 14 Hz), maximal frontocentral region. EEG showed intermittent generalized 3 to 6 Hz theta-delta slowing. Hyperventilation and photic stimulation were not performed.   ABNORMALITY - Intermittent slow, generalized IMPRESSION: This study is suggestive of mild diffuse encephalopathy, nonspecific etiology. No seizures or epileptiform discharges were seen throughout the recording. Charlsie Quest   DG Abd 1 View  Result Date: 01/06/2022 CLINICAL DATA:  Nausea and vomiting. EXAM: ABDOMEN - 1 VIEW COMPARISON:  None Available. FINDINGS: No bowel dilatation or evidence of obstruction. Small volume of formed stool in the right colon. There is excreted IV contrast within the urinary bladder from prior head and neck CTA. Surgical clips in the right upper quadrant typical of cholecystectomy. The lung bases are clear. Surgical hardware in the lumbosacral spine. IMPRESSION: Normal bowel gas pattern.  No obstruction. Electronically Signed   By: Narda Rutherford M.D.   On: 01/06/2022 21:36   MR BRAIN WO CONTRAST  Result Date: 01/06/2022 CLINICAL DATA:  Altered mental status EXAM: MRI HEAD WITHOUT CONTRAST TECHNIQUE: Multiplanar, multiecho pulse  sequences of the brain and surrounding structures were obtained without intravenous contrast. COMPARISON:  None Available. FINDINGS: Brain: No acute infarct, mass effect or extra-axial collection. No acute or chronic hemorrhage. There is multifocal hyperintense T2-weighted signal within the white matter. Generalized volume loss. The midline structures are normal. Vascular: Major flow voids are preserved. Skull and upper cervical spine: Normal calvarium and skull base. Visualized upper cervical spine and soft tissues are normal. Sinuses/Orbits:Left mastoid effusion. Paranasal sinuses are clear. Normal orbits. IMPRESSION: 1. No acute intracranial abnormality. 2. 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 3. Left mastoid effusion. Electronically Signed   By: Deatra Robinson M.D.   On: 01/06/2022 20:28   CT ANGIO HEAD NECK W WO CM W PERF (CODE STROKE)  Result Date: 01/06/2022 CLINICAL DATA:  Left-sided weakness and possible right-sided gaze preference. EXAM: CT ANGIOGRAPHY HEAD AND NECK CT PERFUSION BRAIN TECHNIQUE: Multidetector CT  imaging of the head and neck was performed using the standard protocol during bolus administration of intravenous contrast. Multiplanar CT image reconstructions and MIPs were obtained to evaluate the vascular anatomy. Carotid stenosis measurements (when applicable) are obtained utilizing NASCET criteria, using the distal internal carotid diameter as the denominator. Multiphase CT imaging of the brain was performed following IV bolus contrast injection. Subsequent parametric perfusion maps were calculated using RAPID software. RADIATION DOSE REDUCTION: This exam was performed according to the departmental dose-optimization program which includes automated exposure control, adjustment of the mA and/or kV according to patient size and/or use of iterative reconstruction technique. CONTRAST:  100mL OMNIPAQUE IOHEXOL 350 MG/ML SOLN COMPARISON:  Same-day noncontrast CT head. Soft  tissue neck CT 03/02/2013 FINDINGS: CTA NECK FINDINGS Aortic arch: The imaged aortic arch is normal. The origin of the brachiocephalic artery is not included within the field of view. The origin of the left subclavian artery is patent with mild stenosis due to calcified plaque. The subclavian arteries are otherwise patent to the level imaged. Right carotid system: The right common carotid artery is patent with scattered calcified plaque resulting in less than 50% stenosis. There is calcified plaque at the bifurcation resulting in less than 50% stenosis. The distal right internal carotid artery is patent. The right external carotid artery is patent with mild-to-moderate stenosis proximally. There is no evidence of dissection or aneurysm. Left carotid system: The left common carotid artery is patent with scattered calcified plaque resulting in less than 50% stenosis. There is minimal plaque at the bifurcation without significant stenosis. The distal left internal carotid artery is widely patent. The left external carotid artery is patent. There is no dissection or aneurysm. Vertebral arteries: The left vertebral artery is dominant, a normal variant. A portion of the distal right V2 segment is suboptimally evaluated due to streak artifact from cervical spine hardware. Within this confine, the vertebral arteries are patent, without hemodynamically significant stenosis or occlusion. There is no dissection or aneurysm. Skeleton: There is no acute osseous abnormality or suspicious osseous lesion. Postsurgical changes reflecting posterior instrumented fusion from C2 through T1 are noted. There is no evidence of hardware related complication. There is no visible canal hematoma. Other neck: The soft tissues of the neck are unremarkable. Upper chest: The imaged lung apices are clear. Review of the MIP images confirms the above findings CTA HEAD FINDINGS Anterior circulation: The intracranial ICAs are patent with mild calcified  plaque but no hemodynamically significant stenosis or occlusion. The bilateral MCAs are patent. The bilateral ACAs are patent. The anterior communicating artery is normal. There is no aneurysm or AVM. Posterior circulation: The right V4 segment is hypoplastic/absent after the PICA origin, likely a developmental variant and similar in appearance to the prior CT neck from 2014. The dominant left V4 segment is patent with focal calcified plaque resulting in mild-to-moderate stenosis. The basilar artery is patent. The major cerebellar arteries appear patent. The bilateral PCAs are patent with mild irregularity and narrowing of the left P2 segment. Bilateral posterior communicating arteries are identified. There is no aneurysm or AVM. Venous sinuses: Patent. Anatomic variants: As above. Review of the MIP images confirms the above findings CT Brain Perfusion Findings: CBF (<30%) Volume: 0mL Perfusion (Tmax>6.0s) volume: 0mL Mismatch Volume: 0mL Infarction Location:N/a IMPRESSION: 1. No emergent large vessel occlusion. 2. No infarct core or penumbra identified on CT perfusion. 3. Calcified plaque of the bilateral carotid bifurcations right worse than left without hemodynamically significant stenosis or occlusion. Patent vertebral arteries. 4.  Mild intracranial atherosclerotic disease with no proximal high-grade stenosis or occlusion. Electronically Signed   By: Lesia Hausen M.D.   On: 01/06/2022 16:04   CT HEAD CODE STROKE WO CONTRAST  Result Date: 01/06/2022 CLINICAL DATA:  Code stroke. Neuro deficit, acute, stroke suspected. EXAM: CT HEAD WITHOUT CONTRAST TECHNIQUE: Contiguous axial images were obtained from the base of the skull through the vertex without intravenous contrast. RADIATION DOSE REDUCTION: This exam was performed according to the departmental dose-optimization program which includes automated exposure control, adjustment of the mA and/or kV according to patient size and/or use of iterative reconstruction  technique. COMPARISON:  01/03/2020 FINDINGS: Brain: Chronic small-vessel ischemic change affects the pons. No focal cerebellar insult. Chronic small-vessel ischemic changes affect the cerebral hemispheric white matter. Question mild loss of gray-white differentiation at the deep insula on the left. This is not definite. No evidence of hemorrhage, hydrocephalus or extra-axial collection. Vascular: There is atherosclerotic calcification of the major vessels at the base of the brain. Skull: Negative Sinuses/Orbits: Clear/normal Other: None ASPECTS (Alberta Stroke Program Early CT Score) - Ganglionic level infarction (caudate, lentiform nuclei, internal capsule, insula, M1-M3 cortex): 6 - Supraganglionic infarction (M4-M6 cortex): 3 Total score (0-10 with 10 being normal): 9 IMPRESSION: 1. 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 Question small area loss of gray-white differentiation at the deep insula on the left, not definite. 2. ASPECTS is 9, possibly. 3. These results were communicated to Dr. Selina Cooley at 3:37 pm on 01/06/2022 by text page via the [facility] messaging system. Electronically Signed   By: Paulina Fusi M.D.   On: 01/06/2022 15:39   (Echo, Carotid, EGD, Colonoscopy, ERCP)    Subjective: Patient seen in the morning rounds.  Denies any complaints.  Eager to go home.  Agrees to go to rehab.   Discharge Exam: Vitals:   01/08/22 0752 01/08/22 1202  BP: (!) 152/79 (!) 174/77  Pulse: 83 97  Resp: 14 16  Temp: 98.4 F (36.9 C) 98 F (36.7 C)  SpO2: 100% 99%   Vitals:   01/08/22 0357 01/08/22 0700 01/08/22 0752 01/08/22 1202  BP: (!) 159/68 (!) 169/68 (!) 152/79 (!) 174/77  Pulse: 80 88 83 97  Resp: 14 (!) Temp: 98.5 F (36.9 C) 98.7 F (37.1 C) 98.4 F (36.9 C) 98 F (36.7 C)  TempSrc: Oral Oral Oral Oral  SpO2:  98% 100% 99%  Weight:      Height:        General: Pt is alert, awake, not in acute distress Frail.  Not in any distress.  Alert  and awake and oriented. Cardiovascular: RRR, S1/S2 +, no rubs, no gallops Respiratory: CTA bilaterally, no wheezing, no rhonchi Abdominal: Soft, NT, ND, bowel sounds + Extremities: no edema, no cyanosis    The results of significant diagnostics from this hospitalization (including imaging, microbiology, ancillary and laboratory) are listed below for reference.     Microbiology: Recent Results (from the past 240 hour(s))  Resp Panel by RT-PCR (Flu A&B, Covid) Anterior Nasal Swab     Status: None   Collection Time: 01/06/22  4:31 PM   Specimen: Anterior Nasal Swab  Result Value Ref Range Status   SARS Coronavirus 2 by RT PCR NEGATIVE NEGATIVE Final    Comment: (NOTE) SARS-CoV-2 target nucleic acids are NOT DETECTED.  The SARS-CoV-2 RNA is generally detectable in upper respiratory specimens during the acute phase of infection. The lowest concentration of SARS-CoV-2 viral copies this  assay can detect is 138 copies/mL. A negative result does not preclude SARS-Cov-2 infection and should not be used as the sole basis for treatment or other patient management decisions. A negative result may occur with  improper specimen collection/handling, submission of specimen other than nasopharyngeal swab, presence of viral mutation(s) within the areas targeted by this assay, and inadequate number of viral copies(<138 copies/mL). A negative result must be combined with clinical observations, patient history, and epidemiological information. The expected result is Negative.  Fact Sheet for Patients:  BloggerCourse.com  Fact Sheet for Healthcare Providers:  SeriousBroker.it  This test is no t yet approved or cleared by the [place] FDA and  has been authorized for detection and/or diagnosis of SARS-CoV-2 by FDA under an Emergency Use Authorization (EUA). This EUA will remain  in effect (meaning this test can be used) for the duration of  the COVID-19 declaration under Section 564(b)(1) of the Act, 21 U.S.C.section 360bbb-3(b)(1), unless the authorization is terminated  or revoked sooner.       Influenza A by PCR NEGATIVE NEGATIVE Final   Influenza B by PCR NEGATIVE NEGATIVE Final    Comment: (NOTE) The Xpert Xpress SARS-CoV-2/FLU/RSV plus assay is intended as an aid in the diagnosis of influenza from Nasopharyngeal swab specimens and should not be used as a sole basis for treatment. Nasal washings and aspirates are unacceptable for Xpert Xpress SARS-CoV-2/FLU/RSV testing.  Fact Sheet for Patients: BloggerCourse.com  Fact Sheet for Healthcare Providers: SeriousBroker.it  This test is not yet approved or cleared by the [place] FDA and has been authorized for detection and/or diagnosis of SARS-CoV-2 by FDA under an Emergency Use Authorization (EUA). This EUA will remain in effect (meaning this test can be used) for the duration of the COVID-19 declaration under Section 564(b)(1) of the Act, 21 U.S.C. section 360bbb-3(b)(1), unless the authorization is terminated or revoked.  Performed at [facility], [address] [place], [place] [address]      Labs: BNP (last 3 results) No results for input(s): "BNP" in the last 8760 hours. Basic Metabolic Panel: Recent Labs  Lab 01/06/22 1522 01/06/22 1535 01/06/22 1536 01/07/22 1110  NA 141 141 141 142  K 4.0 4.0 4.0 3.7  CL 108 109  --  110  CO2 20*  --   --  25  GLUCOSE 124* 119*  --  115*  BUN 23 24*  --  14  CREATININE 1.46* 1.40*  --  0.94  CALCIUM 9.7  --   --  9.9  MG  --   --   --  1.7  PHOS  --   --   --  3.5   Liver Function Tests: Recent Labs  Lab 01/06/22 1522 01/07/22 1110  AST 35 23  ALT 16 14  ALKPHOS 45 49  BILITOT 0.5 0.8  PROT 7.3 7.3  ALBUMIN 3.9 3.9   No results for input(s): "LIPASE", "AMYLASE" in the last 168 hours. Recent Labs  Lab 01/06/22 1520  AMMONIA 19    CBC: Recent Labs  Lab 01/06/22 1522 01/06/22 1535 01/06/22 1536 01/07/22 1110  WBC 7.3  --   --  5.6  NEUTROABS 4.4  --   --  3.4  HGB 10.2* 11.6* 11.2* 11.1*  HCT 32.5* 34.0* 33.0* 35.1*  MCV 86.7  --   --  85.2  PLT 201  --   --  204   Cardiac Enzymes: No results for input(s): "CKTOTAL", "CKMB", "CKMBINDEX", "TROPONINI" in the  last 168 hours. BNP: Invalid input(s): "POCBNP" CBG: Recent Labs  Lab 01/08/22 0013 01/08/22 0346 01/08/22 0401 01/08/22 0759 01/08/22 1322  GLUCAP 103* 100* 142* 137* 149*   D-Dimer No results for input(s): "DDIMER" in the last 72 hours. Hgb A1c Recent Labs    01/07/22 0537  HGBA1C 5.7*   Lipid Profile Recent Labs    01/07/22 0537  CHOL 122  HDL 52  LDLCALC 58  TRIG 62  CHOLHDL 2.3   Thyroid function studies Recent Labs    01/06/22 1522  TSH 1.075   Anemia work up Recent Labs    01/06/22 1520  VITAMINB12 482   Urinalysis    Component Value Date/Time   COLORURINE COLORLESS (A) 01/06/2022 1625   APPEARANCEUR CLEAR 01/06/2022 1625   LABSPEC 1.015 01/06/2022 1625   PHURINE 6.0 01/06/2022 1625   GLUCOSEU NEGATIVE 01/06/2022 1625   HGBUR NEGATIVE 01/06/2022 1625   BILIRUBINUR NEGATIVE 01/06/2022 1625   KETONESUR NEGATIVE 01/06/2022 1625   PROTEINUR NEGATIVE 01/06/2022 1625   NITRITE NEGATIVE 01/06/2022 1625   LEUKOCYTESUR NEGATIVE 01/06/2022 1625   Sepsis Labs Recent Labs  Lab 01/06/22 1522 01/07/22 1110  WBC 7.3 5.6   Microbiology Recent Results (from the past 240 hour(s))  Resp Panel by RT-PCR (Flu A&B, Covid) Anterior Nasal Swab     Status: None   Collection Time: 01/06/22  4:31 PM   Specimen: Anterior Nasal Swab  Result Value Ref Range Status   SARS Coronavirus 2 by RT PCR NEGATIVE NEGATIVE Final    Comment: (NOTE) SARS-CoV-2 target nucleic acids are NOT DETECTED.  The SARS-CoV-2 RNA is generally detectable in upper respiratory specimens during the acute phase of infection. The lowest concentration  of SARS-CoV-2 viral copies this assay can detect is 138 copies/mL. A negative result does not preclude SARS-Cov-2 infection and should not be used as the sole basis for treatment or other patient management decisions. A negative result may occur with  improper specimen collection/handling, submission of specimen other than nasopharyngeal swab, presence of viral mutation(s) within the areas targeted by this assay, and inadequate number of viral copies(<138 copies/mL). A negative result must be combined with clinical observations, patient history, and epidemiological information. The expected result is Negative.  Fact Sheet for Patients:  BloggerCourse.com  Fact Sheet for Healthcare Providers:  SeriousBroker.it  This test is no t yet approved or cleared by the [place] FDA and  has been authorized for detection and/or diagnosis of SARS-CoV-2 by FDA under an Emergency Use Authorization (EUA). This EUA will remain  in effect (meaning this test can be used) for the duration of the COVID-19 declaration under Section 564(b)(1) of the Act, 21 U.S.C.section 360bbb-3(b)(1), unless the authorization is terminated  or revoked sooner.       Influenza A by PCR NEGATIVE NEGATIVE Final   Influenza B by PCR NEGATIVE NEGATIVE Final    Comment: (NOTE) The Xpert Xpress SARS-CoV-2/FLU/RSV plus assay is intended as an aid in the diagnosis of influenza from Nasopharyngeal swab specimens and should not be used as a sole basis for treatment. Nasal washings and aspirates are unacceptable for Xpert Xpress SARS-CoV-2/FLU/RSV testing.  Fact Sheet for Patients: BloggerCourse.com  Fact Sheet for Healthcare Providers: SeriousBroker.it  This test is not yet approved or cleared by the [place] FDA and has been authorized for detection and/or diagnosis of SARS-CoV-2 by FDA under an Emergency Use  Authorization (EUA). This EUA will remain in effect (meaning this test can be used) for the duration of  the COVID-19 declaration under Section 564(b)(1) of the Act, 21 U.S.C. section 360bbb-3(b)(1), unless the authorization is terminated or revoked.  Performed at [facility], [address], [place], [place] [address]      Time coordinating discharge: 32 minutes  SIGNED:   Dorcas Carrow, MD  [facility] 01/08/2022, 1:37 PM

## 2022-01-08 NOTE — TOC Transition Note (Addendum)
Transition of Care [facility]) - CM/SW Discharge Note   Patient Details  Name: Sydney Gilbert MRN: [id number] Date of Birth: [birth year]  [facility]) CM/SW Contact:  Mearl Latin, LCSW Phone Number: 01/08/2022, 2:01 PM   Clinical Narrative:    Patient will DC to: [facility] Anticipated DC date: 01/08/22 Family notified: Sister, daughter, and caregiver Dennie Bible [phone]) Transport by: [facility]   Per MD patient ready for DC to [facility]. RN to call report prior to discharge [phone]). RN, patient, patient's family, and facility notified of DC. Discharge Summary and FL2 sent to facility. [facility] transport requested for patient (they have her wheelchair).   CSW will sign off for now as social work intervention is no longer needed. Please consult [place] again if new needs arise.     Final next level of care: Skilled Nursing Facility Barriers to Discharge: Barriers Resolved   Patient Goals and CMS Choice Patient states their goals for this hospitalization and ongoing recovery are:: Rehab CMS Medicare.gov Compare Post Acute Care list provided to:: Patient Choice offered to / list presented to : Patient, Adult Children, Sibling  Discharge Placement   Existing PASRR number confirmed : 01/08/22          Patient chooses bed at: [facility] Patient to be transferred to facility by: [facility] Name of family member notified: Sister, daughter Patient and family notified of of transfer: 01/08/22  Discharge Plan and Services In-house Referral: Clinical Social Work   Post Acute Care Choice: Skilled Nursing Facility                               Social Determinants of Health (SDOH) Interventions     Readmission Risk Interventions     No data to display

## 2022-01-08 NOTE — Progress Notes (Addendum)
9am-CSW sent another message to [facility] with [facility] to inquire about DC plan.  11:30am-CSW left message for supervisor, Irving Burton.  Joaquin Courts, MSW, [facility]

## 2022-01-08 NOTE — Evaluation (Signed)
Speech Language Pathology Evaluation Patient Details Name: Sydney Gilbert MRN: [id number] DOB: [birth year] Today's Date: 01/08/2022 Time: 1040-1105 SLP Time Calculation (min) (ACUTE ONLY): 25 min  Problem List:  Patient Active Problem List   Diagnosis Date Noted   Acute encephalopathy 01/06/2022   AKI (acute kidney injury) (HCC) 01/06/2022   Depression    Facial cellulitis 09/03/2019   Type 2 diabetes mellitus with hyperglycemia (HCC) 09/03/2019   Hypertension associated with diabetes (HCC) 09/03/2019   HLD (hyperlipidemia) 09/03/2019   Past Medical History:  Past Medical History:  Diagnosis Date   Anemia    Arthritis    Depression    Diabetes mellitus without complication (HCC)    GERD (gastroesophageal reflux disease)    Hypertension    Spinal stenosis    Past Surgical History: History reviewed. No pertinent surgical history. HPI:  81yo female admitted 01/06/22 with AMS. PMH: DM2, HTN, HLD, anemia, GERD, depression, mild cognitive impairment. Attends adult day program. Lives with her sister. MRI = no acute intracranial abnormality.   Assessment / Plan / Recommendation Clinical Impression  Pt seen at bedside for cognitive-linguistic evaluation. Pt was awake and alert, pleasant and cooperative. No family or visitors present. Pt reports she lives with her 86yo sister. They manage the cooking and cleaning together. Pt reports she pays the bills, manages the finances, and is independent with taking her medications. Pt's speech is fully intelligible. Receptive and Expressive language appear to be intact.   The Mini-Mental State Examination (MMSE) was administered this date. Pt scored 20/30 (n=25+/30), consistent with mild cognitive impairment. Pt exhibited good immediate recall of 3/3 unrelated words, but was unable to recall any of them after a delay. Pt had difficulty with temporal orientation questions, accurate for year and month only. She reported being in [facility], but was  accurate for other questions regarding orientation to place. Pt acheived 3/5 points spelling WORLD backward. Language questions on the MMSE were accurate with the exception of figure copying. Pt was also asked to complete a clock drawing. Hour markers were misplaced, and she was unable to correctly place minute and hour hand, but drew a straight line from the 11 to the 2 (asked to set hands at 10 minutes after 11). Pt verbalized awareness that her memory is "not the best". Performance on this evaluation indicates clinically significant impairment. Pt would benefit from supervision and assistance to maximize safety. Further assessment may be beneficial to assist with determining nature and extent of functional deficits. This would be most effective at the next venue of care to establish routines and provide education.    SLP Assessment  SLP Recommendation/Assessment: All further Speech Language Pathology needs can be addressed in the next venue of care  SLP Visit Diagnosis: Cognitive communication deficit (R41.841)    Recommendations for follow up therapy are one component of a multi-disciplinary discharge planning process, led by the attending physician.  Recommendations may be updated based on patient status, additional functional criteria and insurance authorization.    Follow Up Recommendations  Skilled nursing-short term rehab (<3 hours/day)    Assistance Recommended at Discharge  Frequent or constant Supervision/Assistance  Functional Status Assessment Patient has had a recent decline in their functional status and demonstrates the ability to make significant improvements in function in a reasonable and predictable amount of time.     SLP Evaluation Cognition  Overall Cognitive Status: History of cognitive impairments - at baseline Arousal/Alertness: Awake/alert       Comprehension  Auditory Comprehension Overall Auditory  Comprehension: Appears within functional limits for tasks assessed     Expression Expression Primary Mode of Expression: Verbal Verbal Expression Overall Verbal Expression: Appears within functional limits for tasks assessed Written Expression Dominant Hand: Right   Oral / Motor  Oral Motor/Sensory Function Overall Oral Motor/Sensory Function: Within functional limits Motor Speech Overall Motor Speech: Appears within functional limits for tasks assessed Intelligibility: Intelligible           Octavie Westerhold B. Murvin Natal, [facility], CCC-SLP Speech Language Pathologist Office: [phone]  Leigh Aurora 01/08/2022, 11:22 AM

## 2022-01-09 LAB — VITAMIN B1: Vitamin B1 (Thiamine): 90 nmol/L (ref 66.5–200.0)

## 2023-01-04 ENCOUNTER — Ambulatory Visit: Payer: Medicare HMO | Admitting: Internal Medicine

## 2023-01-14 ENCOUNTER — Encounter: Payer: Self-pay | Admitting: Student

## 2023-01-14 ENCOUNTER — Ambulatory Visit (INDEPENDENT_AMBULATORY_CARE_PROVIDER_SITE_OTHER): Payer: Medicare HMO | Admitting: Student

## 2023-01-14 ENCOUNTER — Other Ambulatory Visit: Payer: Self-pay

## 2023-01-14 VITALS — BP 137/59 | HR 89 | Temp 98.2°F | Ht 65.0 in | Wt 114.0 lb

## 2023-01-14 DIAGNOSIS — R1013 Epigastric pain: Secondary | ICD-10-CM | POA: Insufficient documentation

## 2023-01-14 DIAGNOSIS — I351 Nonrheumatic aortic (valve) insufficiency: Secondary | ICD-10-CM | POA: Diagnosis not present

## 2023-01-14 DIAGNOSIS — R0789 Other chest pain: Secondary | ICD-10-CM

## 2023-01-14 DIAGNOSIS — F32A Depression, unspecified: Secondary | ICD-10-CM

## 2023-01-14 DIAGNOSIS — Z7409 Other reduced mobility: Secondary | ICD-10-CM

## 2023-01-14 DIAGNOSIS — I152 Hypertension secondary to endocrine disorders: Secondary | ICD-10-CM

## 2023-01-14 DIAGNOSIS — Z789 Other specified health status: Secondary | ICD-10-CM

## 2023-01-14 DIAGNOSIS — E1165 Type 2 diabetes mellitus with hyperglycemia: Secondary | ICD-10-CM | POA: Diagnosis not present

## 2023-01-14 DIAGNOSIS — R4189 Other symptoms and signs involving cognitive functions and awareness: Secondary | ICD-10-CM

## 2023-01-14 DIAGNOSIS — E1159 Type 2 diabetes mellitus with other circulatory complications: Secondary | ICD-10-CM | POA: Diagnosis not present

## 2023-01-14 DIAGNOSIS — D509 Iron deficiency anemia, unspecified: Secondary | ICD-10-CM

## 2023-01-14 DIAGNOSIS — Z7984 Long term (current) use of oral hypoglycemic drugs: Secondary | ICD-10-CM

## 2023-01-14 DIAGNOSIS — E46 Unspecified protein-calorie malnutrition: Secondary | ICD-10-CM | POA: Insufficient documentation

## 2023-01-14 DIAGNOSIS — E43 Unspecified severe protein-calorie malnutrition: Secondary | ICD-10-CM

## 2023-01-14 DIAGNOSIS — R011 Cardiac murmur, unspecified: Secondary | ICD-10-CM

## 2023-01-14 HISTORY — DX: Other chest pain: R07.89

## 2023-01-14 MED ORDER — MIRTAZAPINE 7.5 MG PO TABS: 7.5000 mg | ORAL_TABLET | Freq: Every day | ORAL | 11 refills | Status: DC

## 2023-01-14 MED ORDER — OMEPRAZOLE MAGNESIUM 20 MG PO TBEC: 20.0000 mg | DELAYED_RELEASE_TABLET | Freq: Every day | ORAL | 11 refills | Status: DC

## 2023-01-14 NOTE — Assessment & Plan Note (Signed)
Concerns as of the past year for short term memory. TSH wnl and vitamin D low normal. 2023 CT with chronic small vessel disease and MRI without acute intracranial abnormality. Depression screening positive; will elect to treat the patient at this time and re-evaluate with improvement of PHQ9 score

## 2023-01-14 NOTE — Assessment & Plan Note (Signed)
Worsening weight loss for the past year which could be due to depression, dyspepsia, malignancy. There has also been a markedly functional decline as patient is weaker and is dependent for ADLs now. - Ensure and high calorie foods - HH Aide and Personal care services requested - Occupational therapy referral - Work up as noted earlier in this encounter

## 2023-01-14 NOTE — Progress Notes (Signed)
Subjective:  CC: Establish care  HPI:  [name] is a 82 y.o. female with a past medical history stated below and presents today to establish care after being under the care and supervision of [facility]. She is accompanied today by her daughter Amaka Mandal who assists her in portions of today's interview.   Of note, patient was hospitalized in June 2024 for vomiting and atypical chest pain. She was discharged to a rehab facility and has since been home. She used to participate in [facility] day, occupational therapy, and at home help programs, but has not been able to since April of this year. They report that since the death of her husband in 02-17-2022 and subsequent disbursement of survival benefits, she was required to pay money to [facility] and she chose to rescind their benefits.  Her daughter and patient mentioned that this has been difficult for patient since her level of functioning has greatly declined since April, but more rapidly since her recent SNF discharge.   She is mobility impaired at baseline and needs help with ADLs and iADLs. Of note, her adult children live out of state. She has been taken care of by her sister, who is 54, but this is a very short term solution.   Other acute concerns this family shares with me today include changing mental state for the past year. The patient and her daughter mentioned that she has difficulty with short term recall. We discussed her mood changes for the past year as well and completed as PHQ-9 in office today. Vitamin D and TSH had been checked within the past year and they were within the normal levels.   Patient also reports that she had been losing weight. Her appetite has been poor. She has tried increasing her food but her daughter worries that her memory and mood episodes are affecting how often she eats. There is also concern that she has had dyspepsia for >1 year now. No blood per mouth or rectum. No sensation of things getting stock in her  esophagus. There is reflux after she eats at times. Has tried Prilosec in the past but does not remember the response. She also has a history of chronic constipation that she takes laxatives for. This has mildy improves after discontinuation of chronic opioid use for back pain. She is currently using tylenol therapy and topical medication.   See assessment and plan for more details about the encounter    Past Medical History:  Diagnosis Date   Anemia    Arthritis    Depression    Diabetes mellitus without complication (HCC)    GERD (gastroesophageal reflux disease)    Hypertension    Spinal stenosis     Current Outpatient Medications on File Prior to Visit  Medication Sig Dispense Refill   acetaminophen (TYLENOL) 325 MG tablet Take 650 mg by mouth See admin instructions. During [facility] days     acetaminophen (TYLENOL) 500 MG tablet Take 1,000 mg by mouth 2 (two) times daily as needed for moderate pain.     amLODipine (NORVASC) 10 MG tablet Take 10 mg by mouth daily. (Patient not taking: Reported on 01/08/2022)     Cholecalciferol (VITAMIN D3) 50 MCG (2000 UT) capsule Take 2,000 Units by mouth daily. (Patient not taking: Reported on 01/08/2022)     Colloidal Oatmeal (EUCERIN ECZEMA RELIEF EX) Apply 1 Application topically 2 (two) times daily.     cycloSPORINE (RESTASIS MULTIDOSE) 0.05 % ophthalmic emulsion Place 1 drop into  both eyes every 12 (twelve) hours. Dry eyes     famotidine (PEPCID) 20 MG tablet Take 20 mg by mouth 2 (two) times daily. AS NEEDED FOR HEARTBURN (Patient not taking: Reported on 01/08/2022)     ferrous gluconate (FERGON) 324 MG tablet Take 324 mg by mouth daily with breakfast. (Patient not taking: Reported on 01/08/2022)     glipiZIDE (GLUCOTROL XL) 2.5 MG 24 hr tablet Take 2.5 mg by mouth daily with breakfast.     hydrochlorothiazide (HYDRODIURIL) 25 MG tablet Take 25 mg by mouth daily.     hydrocortisone 2.5 % cream Apply 1 application topically 2 (two) times daily as  needed (For itcing,rash on face). (Patient not taking: Reported on 01/08/2022)     hydroquinone 4 % cream Apply 1 Application topically 2 (two) times daily.     lidocaine (LIDODERM) 5 % Place 1 patch onto the skin daily. Apply over the left middle back where your rib is broken. Remove & Discard patch within 12 hours or as directed by MD (Patient not taking: Reported on 01/08/2022) 30 patch 0   Liniments (SALONPAS ARTHRITIS PAIN RELIEF EX) Apply 1 application topically 2 (two) times daily as needed (For pain management). (Patient not taking: Reported on 01/08/2022)     lisinopril (ZESTRIL) 40 MG tablet Take 40 mg by mouth daily. FOR HTN (Patient not taking: Reported on 01/08/2022)     meloxicam (MOBIC) 15 MG tablet Take 15 mg by mouth daily.     Menthol, Topical Analgesic, (BIOFREEZE EX) Apply 1 application topically 2 (two) times daily as needed (For aches and pain). (Patient not taking: Reported on 01/08/2022)     metoprolol succinate (TOPROL-XL) 100 MG 24 hr tablet Take 100 mg by mouth daily. (Patient not taking: Reported on 01/08/2022)     polyethylene glycol (MIRALAX / GLYCOLAX) 17 g packet Take 17 g by mouth daily as needed for mild constipation. (Patient not taking: Reported on 01/08/2022)     polyvinyl alcohol (LIQUIFILM TEARS) 1.4 % ophthalmic solution Place 1 drop into both eyes in the morning, at noon, in the evening, and at bedtime. (Patient not taking: Reported on 01/08/2022)     Propylene Glycol (SYSTANE COMPLETE OP) Place 1 drop into both eyes in the morning, at noon, in the evening, and at bedtime.     pyrithione zinc (HEAD AND SHOULDERS) 1 % shampoo Apply topically See admin instructions. Use as directed     senna-docusate (SENOKOT-S) 8.6-50 MG tablet Take 2 tablets by mouth at bedtime.  (Patient not taking: Reported on 01/08/2022)     simethicone (MYLICON) 125 MG chewable tablet Chew 125 mg by mouth 3 (three) times daily as needed (gas pains).     simvastatin (ZOCOR) 10 MG tablet Take 10 mg by mouth  every evening.  (Patient not taking: Reported on 01/08/2022)     triamcinolone cream (KENALOG) 0.1 % Apply 1 Application topically daily as needed (rash).     No current facility-administered medications on file prior to visit.    Family History  Family history unknown: Yes    Social History   Socioeconomic History   Marital status: Divorced    Spouse name: Not on file   Number of children: Not on file   Years of education: Not on file   Highest education level: Not on file  Occupational History   Not on file  Tobacco Use   Smoking status: Every Day    Types: Cigarettes   Smokeless tobacco: Never  Substance  and Sexual Activity   Alcohol use: Not Currently   Drug use: Never   Sexual activity: Not Currently  Other Topics Concern   Not on file  Social History Narrative   Not on file   Social Determinants of Health   Financial Resource Strain: Not on file  Food Insecurity: Low Risk  (12/02/2022)   Received from [facility]   Food vital sign    Within the past 12 months, you worried that your food would run out before you got money to buy more: Never true    Within the past 12 months, the food you bought just didn't last and you didn't have money to get more. : Never true  Transportation Needs: Not on file (12/02/2022)  Physical Activity: Not on file  Stress: Not on file  Social Connections: Not on file  Intimate Partner Violence: Not on file    Review of Systems: ROS negative except for what is noted on the assessment and plan.  Objective:   Vitals:   01/14/23 1054 01/14/23 1103  BP: (!) 165/68 (!) 137/59  Pulse: 93 89  Temp: 98.2 F (36.8 C)   TempSrc: Oral   SpO2: 100%   Weight: 114 lb (51.7 kg)   Height: 5\' 5"  (1.651 m)     Physical Exam: Constitutional: well-appearing, cachectic woman sitting in exam chair, in no acute distress HENT: normocephalic atraumatic,with temporal muscle wasting mucous membranes moist, patient is hard of hearing Eyes:  conjunctiva non-erythematous Neck: supple Cardiovascular: regular rate and rhythm, blowing murmur present on auscultation and radiating to the carotids Pulmonary/Chest: normal work of breathing on room air, lungs clear to auscultation bilaterally Abdominal: soft, non-tender, non-distended MSK: decreased muscle bulk, kyphosis of spine Neurological: alert & oriented x 3, unable to raise herself from chair without assistance. Unable to stand for >2 minutes without needing to sit down. Able to ambulate using rolator Skin: warm and dry Psych: Pleasant mood and affect       01/14/2023   11:32 AM  Depression screen PHQ 2/9  Decreased Interest 2  Down, Depressed, Hopeless 2  PHQ - 2 Score 4  Altered sleeping 2  Tired, decreased energy 1  Change in appetite 3  Feeling bad or failure about yourself  1  Trouble concentrating 2  Moving slowly or fidgety/restless 1  Suicidal thoughts 0  PHQ-9 Score 14  Difficult doing work/chores Somewhat difficult    Reviewed outside records, significant for:  11/2022 at most recent hospitalization: Decreased alkaline phosphatase 32 Hgb 9.1 HCT 28.5 MCV 85 TSAT 21 Ferritin 29 LDL 91 PTH normal VIT D 34.3 Hemoglobin A1c 5.6  TTE Left ventricular longitudinal strain is mildly reduced. There is mild diastolic dysfunction, Grade IA, with normal left atrial pressure. The right ventricle is normal in size and function. The left atrium is moderately dilated. Right atrial size is normal. There is moderate to severe aortic regurgitation. There is mild aortic stenosis. There is mild mitral annular calcification. The aortic sinus is normal size. The ascending aorta is normal size. IVC size was normal. There is trivial pericardial effusion. No prior study for comparison.  Aortic atherosclerosis and severe narrowing of the aorta at the origin and proximal SMA. 4-vessel coronary artery calcification also seen. Vascular surgery was consulted; recommended  medical management   Assessment & Plan:   Hypertension associated with diabetes (HCC) At goal today. Patient is currently on amlodipine. Lisinopril was discontinued at recent hospitalization -Continue amlodipine  Type 2 diabetes mellitus with hyperglycemia (  HCC) Most recent A1c 5.6 without medication - No further treatment at this time  Aortic regurgitation Murmur heard on auscultation and confirmed by most recent TTE in 11/2022. Patient is currently asymptomatic  Cognitive impairment Concerns as of the past year for short term memory. TSH wnl and vitamin D low normal. 2023 CT with chronic small vessel disease and MRI without acute intracranial abnormality. Depression screening positive; will elect to treat the patient at this time and re-evaluate with improvement of PHQ9 score  Depression PHQ9 score of 14 today; patient notes worsening in the past 6 months. Previously on Cymbalta, which patient reports has not been taking for a very long time. Discussed therapy with Mirtazapine for depression in addition of sleep and appetite stimulation and she is in agreement -Monitor at 4 weeks  Dyspepsia Patient with worsening dyspepsia with red flag symptoms. Concerned about H pylori vs peptic ulcer vs malignancy. Discussed with patient potential diagnoses. If malignancy, she would want full treatment. Her late husband had  colon cancer and patient appears to understand what treatment entails. As such, will refer to gastroenterology for diagnostic EGD and colonoscopy. Will not obtain H pylori breath test as it is difficult to coordinate in the office (patient is not fasting and been taking OTC antiacids). _Referral to GI  Protein calorie malnutrition (HCC) Worsening weight loss for the past year which could be due to depression, dyspepsia, malignancy. There has also been a markedly functional decline as patient is weaker and is dependent for ADLs now. - Ensure and high calorie foods - HH Aide and  Personal care services requested - Occupational therapy referral - Work up as noted earlier in this encounter  Impaired functional mobility, balance, and endurance  - Ensure and high calorie foods - HH Aide and Personal care services requested - Occupational therapy referral - Work up as noted earlier in this encounter  Atypical chest pain Evaluated at [facility] with milt troponin elevation and EKG changes. Cardiology was consulted; determined non cardiac chest pain. 4-vessel coronary artery calcification also seen on CT. Patient was started on ASA81. No recent chest pain, SOB, changes in vision. After discussion, patient would like to continue ASA 81 mg  with the addition of the prilosec.  -Continue ASA 81 mg   Return in about 4 weeks (around 02/11/2023) for Follow up with Dr. Mayford Knife in about a month..  Patient seen with Dr. Gardiner Ramus, MD [facility] Internal Medicine Program - PGY-2 01/14/2023, 4:30 PM

## 2023-01-14 NOTE — Assessment & Plan Note (Signed)
-   Ensure and high calorie foods - HH Aide and Personal care services requested - Occupational therapy referral - Work up as noted earlier in this encounter

## 2023-01-14 NOTE — Assessment & Plan Note (Signed)
Patient with worsening dyspepsia with red flag symptoms. Concerned about H pylori vs peptic ulcer vs malignancy. Discussed with patient potential diagnoses. If malignancy, she would want full treatment. Her late husband had  colon cancer and patient appears to understand what treatment entails. As such, will refer to gastroenterology for diagnostic EGD and colonoscopy. Will not obtain H pylori breath test as it is difficult to coordinate in the office (patient is not fasting and been taking OTC antiacids). _Referral to GI

## 2023-01-14 NOTE — Assessment & Plan Note (Signed)
Most recent A1c 5.6 without medication - No further treatment at this time

## 2023-01-14 NOTE — Patient Instructions (Addendum)
Thank you, [name] for allowing [place] to provide your care today. Today we discussed    Referrals: -Managed care coordination for transportation needs -Gastroenterologist for endoscopies -ultrasound of your heart for your murmur -Home health occupational therapy  New medications: -Mirtazipine - 7.5 mg tablet at night -Prilosec - 1 tablet in the morning about 15-30 minutes before you drink or eat anything in the mornings -Continue taking the stool softeners to help you poop  My Chart Access: [email or web address]  Please follow-up in: 4 weeks with Dr. Mayford Knife - please bring all your medications (prescribed and over the counter) with you. AND we also want you to bring a family member    We look forward to seeing you next time. Please call our clinic at [phone] if you have any questions or concerns. The best time to call is Monday-Friday from 9am-4pm, but there is someone available 24/7. If after hours or the weekend, call the main hospital number and ask for the Internal Medicine Resident On-Call. If you need medication refills, please notify your pharmacy one week in advance and they will send [place] a request.   Thank you for letting us take part in your care. Wishing you the best!  Morene Crocker, MD 01/14/2023, 11:38 AM [facility] Internal Medicine Resident, PGY-2

## 2023-01-14 NOTE — Assessment & Plan Note (Signed)
PHQ9 score of 14 today; patient notes worsening in the past 6 months. Previously on Cymbalta, which patient reports has not been taking for a very long time. Discussed therapy with Mirtazapine for depression in addition of sleep and appetite stimulation and she is in agreement -Monitor at 4 weeks

## 2023-01-14 NOTE — Assessment & Plan Note (Signed)
Murmur heard on auscultation and confirmed by most recent TTE in 11/2022. Patient is currently asymptomatic

## 2023-01-14 NOTE — Assessment & Plan Note (Signed)
Evaluated at [facility] with milt troponin elevation and EKG changes. Cardiology was consulted; determined non cardiac chest pain. 4-vessel coronary artery calcification also seen on CT. Patient was started on ASA81. No recent chest pain, SOB, changes in vision. After discussion, patient would like to continue ASA 81 mg  with the addition of the prilosec.  -Continue ASA 81 mg

## 2023-01-14 NOTE — Assessment & Plan Note (Signed)
At goal today. Patient is currently on amlodipine. Lisinopril was discontinued at recent hospitalization -Continue amlodipine

## 2023-01-17 ENCOUNTER — Telehealth: Payer: Self-pay

## 2023-01-17 NOTE — Telephone Encounter (Signed)
Return call to [facility], no answer; left message on  her vm of my return call.

## 2023-01-17 NOTE — Patient Outreach (Signed)
Error

## 2023-01-17 NOTE — Progress Notes (Signed)
Internal Medicine Clinic Attending  I saw and evaluated the patient.  I personally confirmed the key portions of the history and exam documented by Dr. Simeon Craft and I reviewed pertinent patient test results.  The assessment, diagnosis, and plan were formulated together and I agree with the documentation in the resident's note.

## 2023-01-17 NOTE — Telephone Encounter (Signed)
Annabelle Harman with [facility] requesting VO for nursing. Please call back.

## 2023-01-17 NOTE — Telephone Encounter (Signed)
Return call from [facility] with [facility]. Requesting verbal orders for "Nursing once a week x 5 weeks and PT once a week for 3 weeks". Pt was seen 8/9 by Dr Daiva Eves as new HFU. VO was given - sending to doctor for approval or denial. Thanks

## 2023-01-25 ENCOUNTER — Encounter: Payer: Self-pay | Admitting: Gastroenterology

## 2023-01-26 ENCOUNTER — Telehealth: Payer: Self-pay

## 2023-01-26 NOTE — Telephone Encounter (Signed)
Return call to [facility]- no answer; message left on him vm of return call.

## 2023-01-26 NOTE — Telephone Encounter (Signed)
[facility] rep is requesting a call back .Marland Kitchen For verbal order      NAME : Sydney Gilbert   TELE :[phone]

## 2023-01-31 ENCOUNTER — Telehealth: Payer: Self-pay

## 2023-01-31 NOTE — Telephone Encounter (Signed)
Pt is requesting a call back .Marland Kitchen She stated that at her last  OV she was told by Dr Lily Kocher she ws going to  send  a referral for her to the [facility]  clinic with Dr Mayford Knife, but no referral has been place ...  Can you let  chilon know if you willing  to place  a referral or not

## 2023-02-01 ENCOUNTER — Telehealth: Payer: Self-pay | Admitting: General Practice

## 2023-02-01 NOTE — Telephone Encounter (Signed)
Please call Annabelle Harman back at [facility] in regards to PT/OT order.  Please call back to [phone].

## 2023-02-03 NOTE — Telephone Encounter (Signed)
RTC to [place], Charity fundraiser  from [employer].  Patient refused pt evaluation when [facility] went out.  Patient did not realize what was going on .  Son now requesting a return visit for evaluation of needs for PT.  Verbal ok given to go back out to do a PT/OT assessment for needs.

## 2023-02-23 ENCOUNTER — Telehealth: Payer: Self-pay | Admitting: *Deleted

## 2023-02-23 NOTE — Telephone Encounter (Signed)
Call from [facility], OT [facility] requesting orders to continue services for patient PT 1 time a week for 9 weeks.  OT 1 time a week for 4 weeks.  Reason falls, weakness, Fatigue and Pain. Verbal order given will forward to PCP for approval.

## 2023-02-24 ENCOUNTER — Ambulatory Visit: Payer: Medicare HMO | Admitting: Internal Medicine

## 2023-02-24 ENCOUNTER — Other Ambulatory Visit (HOSPITAL_COMMUNITY): Payer: Self-pay

## 2023-02-24 ENCOUNTER — Other Ambulatory Visit: Payer: Self-pay

## 2023-02-24 VITALS — BP 153/65 | HR 82 | Wt 113.2 lb

## 2023-02-24 DIAGNOSIS — F321 Major depressive disorder, single episode, moderate: Secondary | ICD-10-CM

## 2023-02-24 DIAGNOSIS — K5900 Constipation, unspecified: Secondary | ICD-10-CM

## 2023-02-24 DIAGNOSIS — R1013 Epigastric pain: Secondary | ICD-10-CM

## 2023-02-24 DIAGNOSIS — E119 Type 2 diabetes mellitus without complications: Secondary | ICD-10-CM

## 2023-02-24 DIAGNOSIS — R634 Abnormal weight loss: Secondary | ICD-10-CM | POA: Diagnosis not present

## 2023-02-24 DIAGNOSIS — E1159 Type 2 diabetes mellitus with other circulatory complications: Secondary | ICD-10-CM | POA: Diagnosis not present

## 2023-02-24 DIAGNOSIS — I152 Hypertension secondary to endocrine disorders: Secondary | ICD-10-CM | POA: Diagnosis not present

## 2023-02-24 DIAGNOSIS — G3184 Mild cognitive impairment, so stated: Secondary | ICD-10-CM

## 2023-02-24 DIAGNOSIS — K5904 Chronic idiopathic constipation: Secondary | ICD-10-CM

## 2023-02-24 DIAGNOSIS — E1165 Type 2 diabetes mellitus with hyperglycemia: Secondary | ICD-10-CM

## 2023-02-24 DIAGNOSIS — M545 Low back pain, unspecified: Secondary | ICD-10-CM

## 2023-02-24 DIAGNOSIS — E559 Vitamin D deficiency, unspecified: Secondary | ICD-10-CM

## 2023-02-24 DIAGNOSIS — R54 Age-related physical debility: Secondary | ICD-10-CM

## 2023-02-24 DIAGNOSIS — G8929 Other chronic pain: Secondary | ICD-10-CM

## 2023-02-24 DIAGNOSIS — Z23 Encounter for immunization: Secondary | ICD-10-CM

## 2023-02-24 DIAGNOSIS — R4189 Other symptoms and signs involving cognitive functions and awareness: Secondary | ICD-10-CM

## 2023-02-24 LAB — GLUCOSE, CAPILLARY: Glucose-Capillary: 117 mg/dL — ABNORMAL HIGH (ref 70–99)

## 2023-02-24 LAB — POCT GLYCOSYLATED HEMOGLOBIN (HGB A1C): Hemoglobin A1C: 5.7 % — AB (ref 4.0–5.6)

## 2023-02-24 MED ORDER — ACETAMINOPHEN ER 650 MG PO TBCR
1300.0000 mg | EXTENDED_RELEASE_TABLET | Freq: Two times a day (BID) | ORAL | 11 refills | Status: DC
Start: 2023-02-24 — End: 2024-01-27
  Filled 2023-02-24: qty 120, 30d supply, fill #0
  Filled 2023-03-23: qty 120, 30d supply, fill #1
  Filled 2023-04-18: qty 120, 30d supply, fill #2
  Filled 2023-05-18 – 2023-05-26 (×3): qty 120, 30d supply, fill #3
  Filled 2023-06-06 – 2023-06-20 (×2): qty 120, 30d supply, fill #4
  Filled 2023-07-11 – 2023-07-19 (×2): qty 120, 30d supply, fill #5
  Filled 2023-08-02 – 2023-08-11 (×2): qty 120, 30d supply, fill #6
  Filled 2023-08-15 – 2023-09-07 (×2): qty 120, 30d supply, fill #7
  Filled 2023-09-26 – 2023-10-04 (×2): qty 120, 30d supply, fill #8
  Filled 2023-10-21 – 2023-10-27 (×2): qty 120, 30d supply, fill #9
  Filled 2023-11-16 – 2023-12-08 (×2): qty 120, 30d supply, fill #10
  Filled 2023-12-26 – 2024-01-05 (×2): qty 120, 30d supply, fill #11

## 2023-02-24 MED ORDER — VITAMIN D 25 MCG (1000 UNIT) PO TABS
1000.0000 [IU] | ORAL_TABLET | Freq: Every day | ORAL | 3 refills | Status: DC
  Filled 2023-02-24: qty 30, 30d supply, fill #0
  Filled 2023-03-23: qty 30, 30d supply, fill #1
  Filled 2023-04-18: qty 30, 30d supply, fill #2
  Filled 2023-05-18 – 2023-05-26 (×3): qty 30, 30d supply, fill #3
  Filled 2023-06-06 – 2023-06-20 (×2): qty 30, 30d supply, fill #4
  Filled 2023-07-11 – 2023-07-19 (×2): qty 30, 30d supply, fill #5
  Filled 2023-08-02 – 2023-08-11 (×2): qty 30, 30d supply, fill #6
  Filled 2023-08-15 – 2023-09-07 (×4): qty 30, 30d supply, fill #7
  Filled 2023-09-26 – 2023-10-04 (×2): qty 30, 30d supply, fill #8
  Filled 2023-10-21 – 2023-10-27 (×2): qty 30, 30d supply, fill #9
  Filled 2023-11-16 – 2023-12-08 (×2): qty 30, 30d supply, fill #10
  Filled 2023-12-26 – 2024-01-05 (×2): qty 30, 30d supply, fill #11

## 2023-02-24 MED ORDER — OMEPRAZOLE 20 MG PO CPDR
20.0000 mg | DELAYED_RELEASE_CAPSULE | Freq: Every day | ORAL | 11 refills | Status: DC
  Filled 2023-02-24 – 2023-02-28 (×2): qty 30, 30d supply, fill #0
  Filled 2023-03-23: qty 30, 30d supply, fill #1
  Filled 2023-04-18: qty 30, 30d supply, fill #2
  Filled 2023-05-18 – 2023-05-26 (×3): qty 30, 30d supply, fill #3
  Filled 2023-06-06 – 2023-06-20 (×2): qty 30, 30d supply, fill #4
  Filled 2023-07-11 – 2023-07-19 (×2): qty 30, 30d supply, fill #5
  Filled 2023-08-02 – 2023-08-11 (×2): qty 30, 30d supply, fill #6
  Filled 2023-08-15 – 2023-09-07 (×2): qty 30, 30d supply, fill #7
  Filled 2023-09-26 – 2023-10-04 (×2): qty 30, 30d supply, fill #8
  Filled 2023-10-21 – 2023-10-27 (×2): qty 30, 30d supply, fill #9
  Filled 2023-11-16 – 2023-12-08 (×2): qty 30, 30d supply, fill #10
  Filled 2023-12-26 – 2024-01-05 (×2): qty 30, 30d supply, fill #11

## 2023-02-24 MED ORDER — AMLODIPINE BESYLATE 10 MG PO TABS
10.0000 mg | ORAL_TABLET | Freq: Every day | ORAL | 3 refills | Status: DC
  Filled 2023-02-24: qty 30, 30d supply, fill #0
  Filled 2023-03-23: qty 30, 30d supply, fill #1
  Filled 2023-04-18: qty 30, 30d supply, fill #2
  Filled 2023-05-18 – 2023-05-26 (×3): qty 30, 30d supply, fill #3
  Filled 2023-06-06 – 2023-06-20 (×2): qty 30, 30d supply, fill #4
  Filled 2023-07-11 – 2023-07-19 (×2): qty 30, 30d supply, fill #5
  Filled 2023-08-02 – 2023-08-11 (×2): qty 30, 30d supply, fill #6
  Filled 2023-08-15 – 2023-09-07 (×2): qty 30, 30d supply, fill #7
  Filled 2023-09-26 – 2023-10-04 (×2): qty 30, 30d supply, fill #8
  Filled 2023-10-21 – 2023-10-27 (×2): qty 30, 30d supply, fill #9
  Filled 2023-11-16 – 2023-12-08 (×2): qty 30, 30d supply, fill #10
  Filled 2023-12-26 – 2024-01-05 (×2): qty 30, 30d supply, fill #11

## 2023-02-24 MED ORDER — LUBIPROSTONE 8 MCG PO CAPS
8.0000 ug | ORAL_CAPSULE | Freq: Two times a day (BID) | ORAL | 11 refills | Status: DC
  Filled 2023-02-24: qty 60, 30d supply, fill #0
  Filled 2023-03-23: qty 60, 30d supply, fill #1
  Filled 2023-04-18: qty 60, 30d supply, fill #2
  Filled 2023-05-18 – 2023-05-26 (×3): qty 60, 30d supply, fill #3
  Filled 2023-06-06 – 2023-06-20 (×2): qty 60, 30d supply, fill #4

## 2023-02-24 MED ORDER — MIRTAZAPINE 15 MG PO TABS
15.0000 mg | ORAL_TABLET | Freq: Every day | ORAL | 11 refills | Status: DC
Start: 2023-02-24 — End: 2023-03-24
  Filled 2023-02-24: qty 30, 30d supply, fill #0
  Filled 2023-03-23: qty 30, 30d supply, fill #1

## 2023-02-24 NOTE — Progress Notes (Signed)
Sydney Gilbert is establishing care in the [facility] (first visit here with Dr. Lily Kocher last month) after [facility] around 09/[facility]22 (copay increased when her income increased upon death of her husband), after which she briefly received care at [facility].  I was her PCP years ago at [facility].  She lives with her older sister as she has for many years.  She is accompanied today by her 2 daughters from [place], Sydney Gilbert and Sydney Gilbert, who are visiting town specifically for today's visit.  Sydney Gilbert most significant problems include intolerable low back pain significantly impacting her sleep and mobility (history of multiple spine surgeries), as well as significant unintended weight loss resulting in underweight and malnourishment.  She has been receiving HH through [facility] upon Dr. Mosie Epstein referral.  [employer] sends frozen meals, otherwise nibbling and snacking, relies on mostly prepared foods with salt.  Patient Active Problem List   Diagnosis Date Noted   Weight loss, abnormal 02/24/2023   Frailty syndrome in geriatric patient 02/24/2023   Cognitive impairment 01/14/2023   Aortic regurgitation 01/14/2023   Dyspepsia 01/14/2023   Protein calorie malnutrition (HCC) 01/14/2023   Impaired functional mobility, balance, and endurance 01/14/2023   Atypical chest pain 01/14/2023   Depression    Type 2 diabetes mellitus with hyperglycemia (HCC) 09/03/2019   Hypertension associated with diabetes (HCC) 09/03/2019   HLD (hyperlipidemia) 09/03/2019   Med list in EMR not accurate - will call family later to determine her medications.  BP (!) 153/65 (BP Location: Right Arm, Patient Position: Sitting, Cuff Size: Small)   Pulse 82   Wt 113 lb 3.2 oz (51.3 kg)   SpO2 100%   BMI 18.84 kg/m  Frail-appearing, underweight woman barely recognizable from 4 years ago if not for her smile, she has lost so much weight.  Speaks clearly.  Affect is normal.  Fidgety in chair, uncomfortable if sits in one position for  long.  Significant loss of soft tissues.  Spinal processes are prominent; spine in flexed curvature.  Surgical scars visible.  Standing posture is significantly bent forward.  Skin is thin and turgor is reduced.  Darkened macular rash present over anterior L shin.  No LE edema.  Heart with RRR systolic murmur (though she actually has AoR which should be diastolic murmur).   Assessment and plan:  Type 2 diabetes, diet controlled (HCC) A1c 5.7 on no medication, and in setting of gradual significant weight loss (unintended) over many months.  No known complications though most recent records ([facility]) not available at the moment.  Hypertension associated with diabetes (HCC) 148/65 on first reading (153/65 on second) on amlodipine 10 mg daily, which she took this morning.  She is VERY FRAIL and I will not change management at this time.  We can readdress this later but priority is determining if she has reversible weight loss.  Weight loss, abnormal Weight 155.65 01/2022: 113.2 02/2023.  I barely recognize her from 3 years ago.  Significantly sarcopenia with severe malnutrition.  First question is whether caloric intake has been adequate.  Sydney Gilbert lives with her older sister.  [employer] is delivering frozen meals, but the extra step of thawing seems to be a barrier.  I neglected to ask her if her sister is also losing weight.  [facility]. TSH nml.  Malignancy is on the differential.  Increase mirtazapine to 15 mg every night for appetite and depressed mood..  Frailty syndrome in geriatric patient Clinical frailty  status based on significant weight loss, poor energy and endurance, generalized weakness, and slow gait speed.  Home health support initiated.  I feel that she would benefit from reenrollment in [facility] and we discussed this at length.  Her daughters are also supportive.  She needs the wraparound services that only [facility] can provide.  Cognitive impairment Cognitive  screening and history not completed today.  She has supervision from daughters from a distance and lives with sister.  Will f/u at a future visit.  All prescriptions sent to [facility] community pharmacy for adherence packaging and delivery  Constipation Refilled lubiprostone.   Chronic lower back pain MRIs reviewed.  Spinal processes are prominent; spine in flexed curvature.  Surgical scars visible.  Standing posture is significantly bent forward. No tenderness over SI joints.  No specific point tenderness but entire LS is uncomfortable to palpation.  Will initiate low dose gabapentin (may also help her sleep along with the mirtazapine). Tylenol 650 two twice a day.  Home PT ordered.  She would not benefit from surgery having already had multiple procedures.  We'll do our best with symptom management.

## 2023-02-24 NOTE — Assessment & Plan Note (Signed)
A1c 5.7 on no medication, and in setting of gradual significant weight loss (unintended) over many months.  No known complications though most recent records ([facility]) not available at the moment.

## 2023-02-24 NOTE — Patient Instructions (Signed)
Ms. Sydney Gilbert, Sydney Gilbert to see you again!  We have work to do.  I'm going to send all of your prescriptions to the [facility] and have your medicines packaged for you and delivered!    Today we are increasing your mirtazapine to 15 mg every night.  This can help with sleep, with depressed mood and with appetite.  We are starting a nerve pain medicine called gabapentin, 300 mg every night.  This may also help you sleep better.  Please be on the lookout for dizziness or unsteadiness.  I'm worried about your weight loss.  I encourage you to try your frozen meals, and to take your Ensures!  You can mix it with icecream!    Keep your appointment with the gastroenterologist to sort out your stomach concerns.  Look into a mattress overlay to protect your bony bits so you don't develop pressure sores.  Stop your diabetes meds!  You don't need them!  Tylenol 650 mg tabs, 2 twice a day every day for pain.  Let's get together in a month.  [facility].  Dr. Mayford Knife :)

## 2023-02-25 ENCOUNTER — Other Ambulatory Visit: Payer: Self-pay

## 2023-02-25 ENCOUNTER — Other Ambulatory Visit (HOSPITAL_COMMUNITY): Payer: Self-pay

## 2023-02-27 ENCOUNTER — Other Ambulatory Visit (HOSPITAL_COMMUNITY): Payer: Self-pay

## 2023-02-28 ENCOUNTER — Other Ambulatory Visit (HOSPITAL_COMMUNITY): Payer: Self-pay

## 2023-02-28 ENCOUNTER — Other Ambulatory Visit: Payer: Self-pay

## 2023-03-04 ENCOUNTER — Other Ambulatory Visit: Payer: Self-pay | Admitting: Internal Medicine

## 2023-03-04 ENCOUNTER — Encounter: Payer: Self-pay | Admitting: Internal Medicine

## 2023-03-04 DIAGNOSIS — M545 Low back pain, unspecified: Secondary | ICD-10-CM | POA: Insufficient documentation

## 2023-03-04 DIAGNOSIS — K5900 Constipation, unspecified: Secondary | ICD-10-CM | POA: Insufficient documentation

## 2023-03-04 DIAGNOSIS — K5909 Other constipation: Secondary | ICD-10-CM | POA: Insufficient documentation

## 2023-03-04 DIAGNOSIS — G8929 Other chronic pain: Secondary | ICD-10-CM | POA: Insufficient documentation

## 2023-03-04 NOTE — Assessment & Plan Note (Signed)
Clinical frailty status based on significant weight loss, poor energy and endurance, generalized weakness, and slow gait speed.  Home health support initiated.  I feel that she would benefit from reenrollment in [facility] and we discussed this at length.  Her daughters are also supportive.  She needs the wraparound services that only [facility] can provide.

## 2023-03-04 NOTE — Assessment & Plan Note (Addendum)
Weight 155.65 01/2022: 113.2 02/2023.  I barely recognize her from 3 years ago.  Significantly sarcopenia with severe malnutrition.  First question is whether caloric intake has been adequate.  Sydney Gilbert lives with her older sister.  [employer] is delivering frozen meals, but the extra step of thawing seems to be a barrier.  I neglected to ask her if her sister is also losing weight.  [facility].  Screening blood work.  Malignancy is on the differential.  Increase mirtazapine to 15 mg every night for appetite and depressed mood.Marland Kitchen

## 2023-03-04 NOTE — Assessment & Plan Note (Addendum)
MRIs reviewed.  Spinal processes are prominent; spine in flexed curvature.  Surgical scars visible.  Standing posture is significantly bent forward. No tenderness over SI joints.  No specific point tenderness but entire LS is uncomfortable to palpation.  Will initiate low dose gabapentin (may also help her sleep along with the mirtazapine). Tylenol 650 two twice a day.  Home PT ordered.  She would not benefit from surgery having already had multiple procedures.  We'll do our best with symptom management.

## 2023-03-04 NOTE — Assessment & Plan Note (Signed)
148/65 on first reading (153/65 on second) on amlodipine 10 mg daily, which she took this morning.  She is VERY FRAIL and I will not change management at this time.  We can readdress this later but priority is determining if she has reversible weight loss.

## 2023-03-04 NOTE — Assessment & Plan Note (Signed)
Refilled lubiprostone.

## 2023-03-04 NOTE — Assessment & Plan Note (Addendum)
Cognitive screening and history not completed today.  She has supervision from daughters from a distance and lives with sister.  Will f/u at a future visit.  All prescriptions sent to [facility] community pharmacy for adherence packaging and delivery

## 2023-03-08 ENCOUNTER — Other Ambulatory Visit (HOSPITAL_COMMUNITY): Payer: Self-pay

## 2023-03-08 ENCOUNTER — Other Ambulatory Visit: Payer: Self-pay

## 2023-03-23 ENCOUNTER — Other Ambulatory Visit: Payer: Self-pay

## 2023-03-24 ENCOUNTER — Ambulatory Visit: Payer: Medicare HMO

## 2023-03-24 ENCOUNTER — Ambulatory Visit: Payer: Medicare HMO | Admitting: Internal Medicine

## 2023-03-24 ENCOUNTER — Other Ambulatory Visit: Payer: Self-pay

## 2023-03-24 VITALS — BP 142/59 | HR 86 | Temp 98.1°F | Ht 65.0 in | Wt 112.3 lb

## 2023-03-24 DIAGNOSIS — Z0000 Encounter for general adult medical examination without abnormal findings: Secondary | ICD-10-CM | POA: Diagnosis not present

## 2023-03-24 DIAGNOSIS — E46 Unspecified protein-calorie malnutrition: Secondary | ICD-10-CM

## 2023-03-24 DIAGNOSIS — G8929 Other chronic pain: Secondary | ICD-10-CM

## 2023-03-24 DIAGNOSIS — F321 Major depressive disorder, single episode, moderate: Secondary | ICD-10-CM

## 2023-03-24 DIAGNOSIS — M545 Low back pain, unspecified: Secondary | ICD-10-CM | POA: Diagnosis not present

## 2023-03-24 DIAGNOSIS — L309 Dermatitis, unspecified: Secondary | ICD-10-CM

## 2023-03-24 DIAGNOSIS — R634 Abnormal weight loss: Secondary | ICD-10-CM

## 2023-03-24 DIAGNOSIS — F32A Depression, unspecified: Secondary | ICD-10-CM

## 2023-03-24 DIAGNOSIS — E440 Moderate protein-calorie malnutrition: Secondary | ICD-10-CM

## 2023-03-24 MED ORDER — TRIAMCINOLONE ACETONIDE 0.1 % EX CREA
1.0000 | TOPICAL_CREAM | Freq: Two times a day (BID) | CUTANEOUS | 1 refills | Status: DC
Start: 2023-03-24 — End: 2024-01-26

## 2023-03-24 MED ORDER — MIRTAZAPINE 15 MG PO TABS
15.0000 mg | ORAL_TABLET | Freq: Every day | ORAL | 11 refills | Status: DC
Start: 2023-03-24 — End: 2023-04-14

## 2023-03-24 NOTE — Progress Notes (Signed)
82 year old Sydney Gilbert, newly establishing in [facility] after [facility], here for 4-week  follow-up of her debilitating chronic back pain, unintended weight loss, and frailty among other problems.  She is accompanied by her son Sydney Gilbert, who has made the trip from [place] for today's appointment; her daughter Sydney Gilbert in [place] joins [place] by phone.  Ms. Sydney Gilbert continues to be quite uncomfortable with her pain.  The increase in mirtazapine and addition of gabapentin at last visit has helped her with sleep, however.  No untoward side effects.  She has been undergoing PT OT in the home.  The family has not further investigated reenrolling in [facility].  More history: Ms. Sydney Gilbert starts her day by traveling to the bathroom with a rollator to wash up at the sink (rollator does not actually fit into the bathroom, so she steadies herself against furniture/sink).  She dresses independently while sitting.  Must stand up to pull up her pants, and is having to manipulate a belt so that her pants do not fall down due to weight loss-it is during these times that she feels most vulnerable to falling.  Uses a rollator to get to the kitchen where she prepares cereal and milk.  Watches TV.  Eats another small meal during the day-her sister, who has cognitive impairment, sometimes will prepare fried fish.  She is not forthcoming on what else they may be eating (she reports that her sister is also losing weight).  Ms. Sydney Gilbert supervises to make sure that the stove gets turned off, etc.  Ms. Sydney Gilbert is not stable enough to maintain standing balance in order to use both hands for a task unless she is leaning up against something.  She is worried about falling, but has not done so.  Sydney Gilbert and Sydney Gilbert ask about an upcoming GI appointment which I was not aware of-apparently this was made following a hospitalization in July, in [place].  Review of that hospitalization notable for concern about mesenteric  ischemia.  Symptoms have been intermittent nausea and vomiting, though Ms. Sydney Gilbert states that this is happening less frequently.  She does have some epigastric discomfort which is partially relieved with her PPI.  She does admit to having a poor appetite and feeling full quickly.    BP (!) 142/59   Pulse 86   Temp 98.1 F (36.7 C) (Oral)   Ht 5\' 5"  (1.651 m)   Wt 112 lb 4.8 oz (50.9 kg)   SpO2 100%   BMI 18.69 kg/m  Ms. Sydney Gilbert is awake and alert, nicely dressed and groomed, appropriately interactive, positive affect initially.  Skin turgor is fair.  Heart RRR.  No change in exam from a month ago-forward flexed position, prominent spinous processes of back, significant sarcopenia with prominent bony protuberances.  She points out areas of skin rash and itching on her elbows, which have appearance of hyperpigmentation and excoriated papules no surrounding scale.  Assessment and plan:  Unfortunately I do not feel that Ms. Sydney Gilbert and her sister are safe to continue living independently.  She has very devoted children who unfortunately live out of state.  I have encouraged her to reconsider enrolling in [facility]  She is concerned about the finances.  Ms. Sydney Gilbert was unable to tell me what she would do if she fell down and needed help.  We discussed a lifeline, and her children will look into this.  They are quickly approaching the point where 24/7 assistance will be needed.  Chronic  lower back pain Sleep and nocturnal pain have improved with low-dose nighttime gabapentin and increasing mirtazapine to 15 mg nightly.  She asked again what could be done about her back.  When I explained that this would be a problem she would have to live with for the rest of her life, she seemed quite shocked.  She would not be a candidate for surgery as she is undergone numerous previous procedures and given her significant frailty and malnutrition.  I explained this to her, as well as my hope that we would be able to keep her  as safe as possible (without falls) while controlling pain as best we can with safe medications.  Weight loss, abnormal She has lost 1 pound in 4 weeks, 2 pounds in 8 weeks.  I suspect insufficient calories as the cause, as both she and her older sister are losing weight.  They are each functionally impaired and consuming inadequate meals.  Family will look into Me[employer] Wheels again, though the waiting list is very very long. Ms. Sydney Gilbert and her sister would benefit from premade meals which require minimal prep work.  Having said that, an underlying illness (mesenteric ischemia?) hasn't been ruled out.  Notably she doesn't endorse post-meal abdominal pain. She doesn't endorse significant constipation.  No diarrhea to suggest malabsorption.  Protein calorie malnutrition (HCC) Ms. Sydney Gilbert is encouraged to drink the Ensures provided by her children.    Depression Ms. Sydney Gilbert has had some benefit with the mirtazapine which is also helping with sleep.  Her sadness has much to do with her decline in physical function and independence.  Will continue to monitor closely.  Dermatitis Pruritic hyperpigmented patches on elbows.  Nonspecific dermatitis will be treated with topical triamcinolone, monitoring for response.   Recheck 4 weeks

## 2023-03-24 NOTE — Patient Instructions (Addendum)
Ms. Hughley To-Do List:  1)  Look into re-enrolling in [facility] 2) Call [facility] [employer] [facility] to see if you can recertify 3) Attend the GI appointment to look further into your low food tolerance and severe weight loss 4)  Drink Ensures as often as you can! 5)  Try lactose-free milk - your diarrhea may improve 6)  Try the prescribed steroid cream on the itchy rash of your elbows and leg.  You will need to use it for a couple of weeks, then stop when the rash goes away.  One month recheck with me!  Dr. Mayford Knife

## 2023-03-24 NOTE — Progress Notes (Signed)
Subjective:   Sydney Gilbert is a 82 y.o. female who presents for an Initial Medicare Annual Wellness Visit.  Visit Complete: In person  Patient Medicare AWV questionnaire was completed by the patient on 03/24/2023; I have confirmed that all information answered by patient is correct and no changes since this date.        Objective:    Today's Vitals   03/24/23 1507  BP: (!) 142/59  Pulse: 86  Temp: 98.1 F (36.7 C)  TempSrc: Oral  Weight: 112 lb 4.8 oz (50.9 kg)  Height: 5\' 5"  (1.651 m)  PainSc: 8    Body mass index is 18.69 kg/m.     03/24/2023    3:09 PM 03/24/2023    9:13 AM 02/24/2023   11:37 AM 01/14/2023   11:01 AM 01/06/2022    3:36 PM 06/19/2021    9:41 AM 09/04/2019   12:34 PM  Advanced Directives  Does Patient Have a Medical Advance Directive? No No No No No No No  Would patient like information on creating a medical advance directive? No - Patient declined No - Patient declined No - Patient declined No - Patient declined  No - Patient declined No - Patient declined    Current Medications (verified) Outpatient Encounter Medications as of 03/24/2023  Medication Sig   acetaminophen (TYLENOL 8 HOUR) 650 MG CR tablet Take 2 tablets (1,300 mg total) by mouth 2 (two) times daily.   amLODipine (NORVASC) 10 MG tablet Take 1 tablet (10 mg total) by mouth daily.   cholecalciferol (VITAMIN D3) 25 MCG (1000 UNIT) tablet Take 1 tablet (1,000 Units total) by mouth daily.   lidocaine (LIDODERM) 5 % Place 1 patch onto the skin daily. Apply over the left middle back where your rib is broken. Remove & Discard patch within 12 hours or as directed by MD (Patient not taking: Reported on 01/08/2022)   lubiprostone (AMITIZA) 8 MCG capsule Take 1 capsule (8 mcg total) by mouth 2 (two) times daily with a meal.   mirtazapine (REMERON) 15 MG tablet Take 1 tablet (15 mg total) by mouth at bedtime.   omeprazole (PRILOSEC) 20 MG capsule Take 1 capsule (20 mg total) by mouth daily.    triamcinolone cream (KENALOG) 0.1 % Apply 1 Application topically 2 (two) times daily. For itchy rash   No facility-administered encounter medications on file as of 03/24/2023.    Allergies (verified) Metformin and related, Penicillins, and Varenicline   History: Past Medical History:  Diagnosis Date   Anemia    Arthritis    Depression    Diabetes mellitus without complication (HCC)    GERD (gastroesophageal reflux disease)    Hypertension    Spinal stenosis    Type 2 diabetes, diet controlled (HCC) 09/03/2019   History reviewed. No pertinent surgical history. Family History  Family history unknown: Yes   Social History   Socioeconomic History   Marital status: Divorced    Spouse name: Not on file   Number of children: Not on file   Years of education: Not on file   Highest education level: Not on file  Occupational History   Not on file  Tobacco Use   Smoking status: Every Day    Types: Cigarettes   Smokeless tobacco: Never  Substance and Sexual Activity   Alcohol use: Not Currently   Drug use: Never   Sexual activity: Not Currently  Other Topics Concern   Not on file  Social History Narrative   Not on  file   Social Determinants of Health   Financial Resource Strain: Low Risk  (03/24/2023)   Overall Financial Resource Strain (CARDIA)    Difficulty of Paying Living Expenses: Not hard at all  Food Insecurity: No Food Insecurity (03/24/2023)   Hunger Vital Sign    Worried About Running Out of Food in the Last Year: Never true    Ran Out of Food in the Last Year: Never true  Transportation Needs: No Transportation Needs (03/24/2023)   PRAPARE - Administrator, Civil Service (Medical): No    Lack of Transportation (Non-Medical): No  Physical Activity: Insufficiently Active (03/24/2023)   Exercise Vital Sign    Days of Exercise per Week: 1 day    Minutes of Exercise per Session: 20 min  Stress: No Stress Concern Present (03/24/2023)   [employer] of Occupational Health - Occupational Stress Questionnaire    Feeling of Stress : Only a little  Social Connections: Moderately Integrated (03/24/2023)   Social Connection and Isolation Panel [NHANES]    Frequency of Communication with Friends and Family: More than three times a week    Frequency of Social Gatherings with Friends and Family: Three times a week    Attends Religious Services: 1 to 4 times per year    Active Member of Clubs or Organizations: Yes    Attends Banker Meetings: 1 to 4 times per year    Marital Status: Widowed    Tobacco Counseling Ready to quit: Not Answered Counseling given: Not Answered   Clinical Intake:  Pre-visit preparation completed: Yes  Pain : 0-10 Pain Score: 8  Pain Type: Acute pain Pain Location: Back Pain Orientation: Lower Pain Descriptors / Indicators: Aching Pain Onset: More than a month ago Pain Frequency: Intermittent Pain Relieving Factors: tylenol Effect of Pain on Daily Activities: sometimes  Pain Relieving Factors: tylenol  Nutritional Risks: None Diabetes: Yes CBG done?: No CBG resulted in Enter/ Edit results?: No Did pt. bring in CBG monitor from home?: No  How often do you need to have someone help you when you read instructions, pamphlets, or other written materials from your doctor or pharmacy?: 1 - Never What is the last grade level you completed in school?: 13 years  Interpreter Needed?: No  Information entered by :: Maat Kafer,cma   Activities of Daily Living    03/24/2023    3:09 PM 03/24/2023    9:12 AM  In your present state of health, do you have any difficulty performing the following activities:  Hearing? 0 0  Vision? 1 0  Difficulty concentrating or making decisions? 1 0  Walking or climbing stairs? 1 1  Dressing or bathing? 1 1  Doing errands, shopping? 1 1    Patient Care Team: Miguel Aschoff, MD as PCP - General (Internal Medicine)  Indicate any recent  Medical Services you may have received from other than [facility] providers in the past year (date may be approximate).     Assessment:   This is a routine wellness examination for Sydney Gilbert.  Hearing/Vision screen No results found.   Goals Addressed   None   Depression Screen    03/24/2023    3:10 PM 01/14/2023   11:32 AM  PHQ 2/9 Scores  PHQ - 2 Score 0 4  PHQ- 9 Score  14    Fall Risk    03/24/2023    3:09 PM 03/24/2023    9:12 AM 01/14/2023   11:01 AM  Fall  Risk   Falls in the past year? 1 1 1   Number falls in past yr: 0 0 1  Injury with Fall? 0 0 1  Risk for fall due to : No Fall Risks  Impaired balance/gait;History of fall(s)  Follow up Falls evaluation completed;Falls prevention discussed Falls evaluation completed Falls evaluation completed    MEDICARE RISK AT HOME: Medicare Risk at Home Any stairs in or around the home?: Yes If so, are there any without handrails?: No Home free of loose throw rugs in walkways, pet beds, electrical cords, etc?: Yes Adequate lighting in your home to reduce risk of falls?: Yes Life alert?: No Use of a cane, walker or w/c?: Yes Grab bars in the bathroom?: Yes Shower chair or bench in shower?: Yes Elevated toilet seat or a handicapped toilet?: Yes  TIMED UP AND GO:  Was the test performed? No    Cognitive Function:        Immunizations Immunization History  Administered Date(s) Administered   Fluad Trivalent(High Dose 65+) 02/24/2023    TDAP status: Due, Education has been provided regarding the importance of this vaccine. Advised may receive this vaccine at local pharmacy or Health Dept. Aware to provide a copy of the vaccination record if obtained from local pharmacy or Health Dept. Verbalized acceptance and understanding.  Flu Vaccine status: Up to date  Pneumococcal vaccine status: Due, Education has been provided regarding the importance of this vaccine. Advised may receive this vaccine at local pharmacy or Health Dept.  Aware to provide a copy of the vaccination record if obtained from local pharmacy or Health Dept. Verbalized acceptance and understanding.  Covid-19 vaccine status: Information provided on how to obtain vaccines.   Qualifies for Shingles Vaccine? No   Zostavax completed No   Shingrix Completed?: No.    Education has been provided regarding the importance of this vaccine. Patient has been advised to call insurance company to determine out of pocket expense if they have not yet received this vaccine. Advised may also receive vaccine at local pharmacy or Health Dept. Verbalized acceptance and understanding.  Screening Tests Health Maintenance  Topic Date Due   COVID-19 Vaccine (1) Never done   Pneumonia Vaccine 70+ Years old (1 of 2 - PCV) Never done   FOOT EXAM  Never done   OPHTHALMOLOGY EXAM  Never done   Diabetic kidney evaluation - Urine ACR  Never done   DTaP/Tdap/Td (1 - Tdap) Never done   Zoster Vaccines- Shingrix (1 of 2) Never done   DEXA SCAN  Never done   Diabetic kidney evaluation - eGFR measurement  01/08/2023   HEMOGLOBIN A1C  08/24/2023   Medicare Annual Wellness (AWV)  03/23/2024   INFLUENZA VACCINE  Completed   HPV VACCINES  Aged Out    Health Maintenance  Health Maintenance Due  Topic Date Due   COVID-19 Vaccine (1) Never done   Pneumonia Vaccine 8+ Years old (1 of 2 - PCV) Never done   FOOT EXAM  Never done   OPHTHALMOLOGY EXAM  Never done   Diabetic kidney evaluation - Urine ACR  Never done   DTaP/Tdap/Td (1 - Tdap) Never done   Zoster Vaccines- Shingrix (1 of 2) Never done   DEXA SCAN  Never done   Diabetic kidney evaluation - eGFR measurement  01/08/2023    Lung Cancer Screening: (Low Dose CT Chest recommended if Age 42-80 years, 20 pack-year currently smoking OR have quit w/in 15years.) does not qualify.   Lung Cancer  Screening Referral: N/A  Additional Screening:  Hepatitis C Screening: does not qualify; Completed N/A  Vision Screening:  Recommended annual ophthalmology exams for early detection of glaucoma and other disorders of the eye. Is the patient up to date with their annual eye exam?  Yes  Who is the provider or what is the name of the office in which the patient attends annual eye exams? Dr.Pace If pt is not established with a provider, would they like to be referred to a provider to establish care? No .   Dental Screening: Recommended annual dental exams for proper oral hygiene  Diabetic Foot Exam: Diabetic Foot Exam: Overdue, Pt has been advised about the importance in completing this exam. Pt is scheduled for diabetic foot exam on her next visit.  Community Resource Referral / Chronic Care Management: CRR required this visit?  No   CCM required this visit?  No     Plan:     I have personally reviewed and noted the following in the patient's chart:   Medical and social history Use of alcohol, tobacco or illicit drugs  Current medications and supplements including opioid prescriptions. Patient is not currently taking opioid prescriptions. Functional ability and status Nutritional status Physical activity Advanced directives List of other physicians Hospitalizations, surgeries, and ER visits in previous 12 months Vitals Screenings to include cognitive, depression, and falls Referrals and appointments  In addition, I have reviewed and discussed with patient certain preventive protocols, quality metrics, and best practice recommendations. A written personalized care plan for preventive services as well as general preventive health recommendations were provided to patient.     Cala Bradford, CMA   03/24/2023   After Visit Summary: (MyChart) Due to this being a telephonic visit, the after visit summary with patients personalized plan was offered to patient via MyChart   Nurse Notes: Face-To-Face Visit  Ms. Dayton Scrape , Thank you for taking time to come for your Medicare Wellness Visit. I appreciate your  ongoing commitment to your health goals. Please review the following plan we discussed and let me know if I can assist you in the future.   These are the goals we discussed:  Goals   None     This is a list of the screening recommended for you and due dates:  Health Maintenance  Topic Date Due   COVID-19 Vaccine (1) Never done   Pneumonia Vaccine (1 of 2 - PCV) Never done   Complete foot exam   Never done   Eye exam for diabetics  Never done   Yearly kidney health urinalysis for diabetes  Never done   DTaP/Tdap/Td vaccine (1 - Tdap) Never done   Zoster (Shingles) Vaccine (1 of 2) Never done   DEXA scan (bone density measurement)  Never done   Yearly kidney function blood test for diabetes  01/08/2023   Hemoglobin A1C  08/24/2023   Medicare Annual Wellness Visit  03/23/2024   Flu Shot  Completed   HPV Vaccine  Aged Out

## 2023-03-25 ENCOUNTER — Other Ambulatory Visit: Payer: Self-pay

## 2023-03-25 NOTE — Progress Notes (Signed)
AWV reviewed.  I evaluated Sydney Gilbert in person as well. Many of her missing care gaps/preventative measures have probably been completed at [facility] though we don't have those records.  Sydney Gilbert is in a state of significant functional decline and planning for next steps is actively underway with assistance of her adult children who live out of town.  Sydney Gilbert is probably not safe to be in her current environment without increased supervision and assistance.  Her nutritional status is inadequate, she is losing significant weight, and is at an extremely high fall risk.  I have encouraged her to reconsider PACE enrollment.

## 2023-03-26 DIAGNOSIS — L309 Dermatitis, unspecified: Secondary | ICD-10-CM

## 2023-03-26 DIAGNOSIS — L300 Nummular dermatitis: Secondary | ICD-10-CM | POA: Insufficient documentation

## 2023-03-26 HISTORY — DX: Dermatitis, unspecified: L30.9

## 2023-03-26 MED ORDER — GABAPENTIN 300 MG PO CAPS
300.0000 mg | ORAL_CAPSULE | Freq: Every day | ORAL | 11 refills | Status: DC
Start: 2023-03-26 — End: 2023-04-14

## 2023-03-26 NOTE — Assessment & Plan Note (Signed)
Sydney Gilbert has had some benefit with the mirtazapine which is also helping with sleep.  Her sadness has much to do with her decline in physical function and independence.  Will continue to monitor closely.

## 2023-03-26 NOTE — Assessment & Plan Note (Signed)
Sleep and nocturnal pain have improved with low-dose nighttime gabapentin and increasing mirtazapine to 15 mg nightly.  She asked again what could be done about her back.  When I explained that this would be a problem she would have to live with for the rest of her life, she seemed quite shocked.  She would not be a candidate for surgery as she is undergone numerous previous procedures and given her significant frailty and malnutrition.  I explained this to her, as well as my hope that we would be able to keep her as safe as possible (without falls) while controlling pain as best we can with safe medications.

## 2023-03-26 NOTE — Assessment & Plan Note (Signed)
Pruritic hyperpigmented patches on elbows.  Nonspecific dermatitis will be treated with topical triamcinolone, monitoring for response.

## 2023-03-26 NOTE — Assessment & Plan Note (Signed)
Sydney Gilbert is encouraged to drink the Ensures provided by her children.

## 2023-03-26 NOTE — Assessment & Plan Note (Addendum)
She has lost 1 pound in 4 weeks, 2 pounds in 8 weeks.  I suspect insufficient calories as the cause, as both she and her older sister are losing weight.  They are each functionally impaired and consuming inadequate meals.  Family will look into Meals on Wheels again, though the waiting list is very very long. Sydney Gilbert and her sister would benefit from premade meals which require minimal prep work.  Having said that, an underlying illness (mesenteric ischemia?) hasn't been ruled out.  Notably she doesn't endorse post-meal abdominal pain. She doesn't endorse significant constipation.  No diarrhea to suggest malabsorption.

## 2023-04-06 ENCOUNTER — Other Ambulatory Visit: Payer: Self-pay

## 2023-04-11 ENCOUNTER — Encounter: Payer: Self-pay | Admitting: Gastroenterology

## 2023-04-11 ENCOUNTER — Other Ambulatory Visit (INDEPENDENT_AMBULATORY_CARE_PROVIDER_SITE_OTHER): Payer: Medicare HMO

## 2023-04-11 ENCOUNTER — Ambulatory Visit: Payer: Medicare HMO | Admitting: Gastroenterology

## 2023-04-11 VITALS — BP 110/70 | HR 87 | Ht 65.0 in | Wt 114.1 lb

## 2023-04-11 DIAGNOSIS — D649 Anemia, unspecified: Secondary | ICD-10-CM

## 2023-04-11 DIAGNOSIS — E739 Lactose intolerance, unspecified: Secondary | ICD-10-CM

## 2023-04-11 DIAGNOSIS — K551 Chronic vascular disorders of intestine: Secondary | ICD-10-CM | POA: Diagnosis not present

## 2023-04-11 DIAGNOSIS — R634 Abnormal weight loss: Secondary | ICD-10-CM

## 2023-04-11 DIAGNOSIS — R109 Unspecified abdominal pain: Secondary | ICD-10-CM | POA: Diagnosis not present

## 2023-04-11 LAB — VITAMIN D 25 HYDROXY (VIT D DEFICIENCY, FRACTURES): VITD: 43.5 ng/mL (ref 30.00–100.00)

## 2023-04-11 LAB — IBC + FERRITIN
Ferritin: 43.6 ng/mL (ref 10.0–291.0)
Iron: 122 ug/dL (ref 42–145)
Saturation Ratios: 37.1 % (ref 20.0–50.0)
TIBC: 329 ug/dL (ref 250.0–450.0)
Transferrin: 235 mg/dL (ref 212.0–360.0)

## 2023-04-11 LAB — FOLATE: Folate: 22.8 ng/mL (ref >5.9)

## 2023-04-11 LAB — VITAMIN B12: Vitamin B-12: 749 pg/mL (ref 211–911)

## 2023-04-11 NOTE — Patient Instructions (Signed)
Your provider has requested that you go to the basement level for lab work before leaving today. Press "B" on the elevator. The lab is located at the first door on the left as you exit the elevator.  You have been referred to Nutrition. Someone will contact you to schedule.If you have not heard from them within 1-2 weeks, please let us know.   Due to recent changes in healthcare laws, you may see the results of your imaging and laboratory studies on MyChart before your provider has had a chance to review them.  We understand that in some cases there may be results that are confusing or concerning to you. Not all laboratory results come back in the same time frame and the provider may be waiting for multiple results in order to interpret others.  Please give [place] 48 hours in order for your provider to thoroughly review all the results before contacting the office for clarification of your results.   _______________________________________________________  If your blood pressure at your visit was 140/90 or greater, please contact your primary care physician to follow up on this.  _______________________________________________________  If you are age 44 or older, your body mass index should be between 23-30. Your Body mass index is 18.99 kg/m. If this is out of the aforementioned range listed, please consider follow up with your Primary Care Provider.  If you are age 40 or younger, your body mass index should be between 19-25. Your Body mass index is 18.99 kg/m. If this is out of the aformentioned range listed, please consider follow up with your Primary Care Provider.   ________________________________________________________  The [facility] providers would like to encourage you to use [facility] to communicate with providers for non-urgent requests or questions.  Due to long hold times on the telephone, sending your provider a message by [facility] may be a faster and more efficient way to get a  response.  Please allow 48 business hours for a response.  Please remember that this is for non-urgent requests.  _______________________________________________________  Thank you for choosing me and [facility].    Lactose-Free Diet, Adult If you have lactose intolerance, you are not able to digest lactose. Lactose is a natural sugar found mainly in dairy milk and dairy products. A lactose-free diet can help you avoid foods and beverages that contain lactose. What are tips for following this plan? Reading food labels Do not consume foods, beverages, vitamins, minerals, or medicines containing lactose. Read ingredient lists carefully. Look for the words "lactose-free" on labels. Meal planning Use alternatives to dairy milk and foods made with milk products. These include the following: Lactose-free milk. Soy milk with added calcium and vitamin D. Almond milk, coconut milk, rice milk, or other nondairy milk alternatives with added calcium and vitamin D. Note that a lot of these are low in protein. Soy products, such as soy yogurt, soy cheese, soy ice cream, and soy-based sour cream. Other nut milk products, such as almond yogurt, almond cheese, cashew yogurt, cashew cheese, cashew ice cream, coconut yogurt, and coconut ice cream. Medicines, vitamins, and supplements Use lactase enzyme drops or tablets as directed by your health care provider. Make sure you get enough calcium and vitamin D in your diet. A lactose-free eating plan can be lacking in these important nutrients. Take calcium and vitamin D supplements as directed by your health care provider. Talk with your health care provider about supplements if you are not able to get enough calcium and vitamin D from  food. What foods should I eat?  Fruits All fresh, canned, frozen, or dried fruits and fruit juices that are not processed with lactose. Vegetables All fresh, frozen, and canned vegetables without cheese, cream, or  butter sauces. Grains Any that are not made with dairy milk or dairy products. Meats and other proteins Any meat, fish, poultry, and other protein sources that are not made with dairy milk or dairy products. Fats and oils Any that are not made with dairy milk or dairy products. Sweets and desserts Any that are not made with dairy milk or dairy products. Seasonings and condiments Any that are not made with dairy milk or dairy products. Calcium Calcium is found in many foods that contain lactose and is important for bone health. The amount of calcium you need depends on your age: Adults younger than 50 years: 1,000 mg of calcium a day. Adults older than 50 years: 1,200 mg of calcium a day. If you are not getting enough calcium, you may get it from other sources, including: Orange juice that has been fortified with calcium. This means that calcium has been added to the product. There are 300-350 mg of calcium in 1 cup (237 mL) of calcium-fortified orange juice. Soy milk fortified with calcium. There are 300-400 mg of calcium in 1 cup (237 mL) of calcium-fortified soy milk. Rice or almond milk fortified with calcium. There are 300 mg of calcium in 1 cup (237 mL) of calcium-fortified rice or almond milk. Breakfast cereals fortified with calcium. There are 100-1,000 mg of calcium in calcium-fortified breakfast cereals. Spinach, cooked. There are 145 mg of calcium in  cup (90 g) of cooked spinach. Edamame, cooked. There are 130 mg of calcium in  cup (47 g) of cooked edamame. Collard greens, cooked. There are 125 mg of calcium in  cup (85 g) of cooked collard greens. Kale, frozen or cooked. There are 90 mg of calcium in  cup (59 g) of cooked or frozen kale. Almonds. There are 95 mg of calcium in  cup (35 g) of almonds. Broccoli, cooked. There are 60 mg of calcium in 1 cup (156 g) of cooked broccoli. The items listed above may not be a complete list of foods and beverages you can eat. Contact a  dietitian for more options. What foods should I avoid? Lactose is found in dairy milk and dairy products, such as: Yogurt. Cheese. Butter. Margarine. Sour cream. Cream. Whipped toppings and creamers. Ice cream and other dairy-based desserts. Lactose is also found in foods or products made with dairy milk or milk ingredients. To find out whether a food contains dairy milk or a milk ingredient, look at the ingredients list. Avoid foods with the statement "May contain milk" and foods that contain: Milk powder. Whey. Curd. Lactose. Lactoglobulin. The items listed above may not be a complete list of foods and beverages to avoid. Contact a dietitian for more information. Where to find more information [employer] of Diabetes and Digestive and Kidney Diseases: [email or web address] Summary If you are lactose intolerant, it means that you are not able to digest lactose, a natural sugar found in milk and milk products. Following a lactose-free diet can help you manage this condition. Calcium is important for bone health and is found in many foods that contain lactose. Talk with your health care provider about other sources of calcium. This information is not intended to replace advice given to you by your health care provider. Make sure you discuss any questions you have with  your health care provider. Document Revised: 04/29/2020 Document Reviewed: 04/29/2020 Elsevier Patient Education  2024 [employer].

## 2023-04-11 NOTE — Progress Notes (Unsigned)
Discussed the use of AI scribe software for clinical note transcription with the patient, who gave verbal consent to proceed.  HPI : Sydney Gilbert is a 82 y.o. female with a history of diabetes, hypertension and depression who is referred to [place] by Reymundo Poll, MD evaluation of normocytic anemia, abdominal pain and weight loss.  The patient is accompanied by 2 of her daughters, who usually live in [place] but are in town temporarily.  They help provide some of the history.  It appears that the patient has been having issues with postprandial abdominal pain, nausea, vomiting, sitophobia and weight loss for well over a year now.  She has apparently been admitted to the hospital twice with the symptoms, most recently in June of this year.  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  Vascular surgery was consulted, but did not recommend stenting.  Outpatient GI consultation was recommended to further evaluate her unintentional weight loss.  The weight loss was significant, with the patient's weight dropping from 133 lbs to 114 lbs over the course of a year. However, the weight has stabilized over the past few months, with a slight increase noted recently.  The patient's abdominal pain was described as severe and was associated with vomiting. The pain and vomiting typically occurred within an hour of eating, leading to a decrease in food intake.   However, the patient reported a recent improvement in symptoms, with the last episode of vomiting occurring in June.  The patient's current diet consists mainly of cereal, sweets, ice cream and nutritional shakes, such as Ensure. However, the patient reported experiencing diarrhea after consuming these shakes and other dairy products, suggesting possible lactose intolerance.  In addition to these symptoms, the patient  reported a history of back surgeries and currently lives with an older sibling. The patient's family has been actively involved in her care, with a sibling visiting monthly to provide assistance.  The patient has not undergone any upper or lower endoscopies.  Overall, the patient's symptoms and weight loss have been concerning, but recent improvements and stabilization of weight suggest a less severe underlying cause. The patient's symptoms, particularly the diarrhea following dairy consumption, suggest a possible dietary component to the patient's condition.     The patient has a chronic history of anemia.  She was evaluated by Dr. Octaviano Glow with [facility] in 2018 for abdominal pain and normocytic anemia.  She was scheduled for an EGD and colonoscopy at that visit, but it does not appear these procedures were ever completed.  An iron panel obtained during her hospitalization was consistent with anemia of chronic disease,not iron deficiency   [place] w/ dopplers June 2024 Venous  The IVC, portal veins, hepatic veins, porta-splenic confluence, superior  mesenteric vein and splenic  vein are patent.  Arterial  The celiac axis velocity is 128 cm-s, by duplex criteria, this is less  than 70% stenosis >=200  cm-s.  The superior mesenteric artery velocity is mildly elevated. The velocity  measured at the origin is  304 cm-s. This is a stenosis of greater than 70% stenosis by duplex  criteria >=270 cm-s.  The common hepatic artery, proper hepatic artery, splenic artery, inferior  mesenteric artery are  patent and display no evidence of stenosis by duplex criteria.  Pre- amnd post-prandial imaging was not ordered.  Measurements and Calculations  Right No Laterality Left  Unit  Mid  Ao AP 1.7  cm  Prox Ao AP 2.3  cm  AO Measurements  Supra-renal aorta A-P peak velocity is 2.3 cm.  Supra-renal aorta peak velocity is 57.8 cm-s.  Juxta-renal aorta A-P diameter is 1.74 cm.   Juxta-renal aorta peak velocity diameter is 73.8 cm-s.  Infra-renal aorta A-P diameter is 1.06 cm.  Infra-renal aorta peak velocity is 1.15 cm-s.  Right Iliac Measurements  Common iliac artery A-P diameter is 1.06 cm. Common iliac artery peak  velocity is 37.4 cm-s.  Left Iliac Measurements  Common iliac artery A-P diameter is 1.15 cm. Common iliac artery peak  velocity is 95.8 cm-s.   CT Abdomen/Pelvis December 01, 2022 IMPRESSION:  1. No acute intrathoracic, intra-abdominal, intrapelvic abnormality.  No findings to suggest organ ischemia.  2. 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 Four-vessel coronary calcification   Past Medical History:  Diagnosis Date   Anemia    Arthritis    Depression    Diabetes mellitus without complication (HCC)    GERD (gastroesophageal reflux disease)    Hypertension    Spinal stenosis    Type 2 diabetes, diet controlled (HCC) 09/03/2019     No past surgical history on file. Family History  Family history unknown: Yes   Social History   Tobacco Use   Smoking status: Every Day    Types: Cigarettes   Smokeless tobacco: Never  Substance Use Topics   Alcohol use: Not Currently   Drug use: Never   Current Outpatient Medications  Medication Sig Dispense Refill   acetaminophen (TYLENOL 8 HOUR) 650 MG CR tablet Take 2 tablets (1,300 mg total) by mouth 2 (two) times daily. 120 tablet 11   amLODipine (NORVASC) 10 MG tablet Take 1 tablet (10 mg total) by mouth daily. 90 tablet 3   cholecalciferol (VITAMIN D3) 25 MCG (1000 UNIT) tablet Take 1 tablet (1,000 Units total) by mouth daily. 90 tablet 3   gabapentin (NEURONTIN) 300 MG capsule Take 1 capsule (300 mg total) by mouth at bedtime. 30 capsule 11   lidocaine (LIDODERM) 5 % Place 1 patch onto the skin daily. Apply over the left middle back where your rib is broken. Remove & Discard patch  within 12 hours or as directed by MD (Patient not taking: Reported on 01/08/2022) 30 patch 0   lubiprostone (AMITIZA) 8 MCG capsule Take 1 capsule (8 mcg total) by mouth 2 (two) times daily with a meal. 60 capsule 11   mirtazapine (REMERON) 15 MG tablet Take 1 tablet (15 mg total) by mouth at bedtime. 60 tablet 11   omeprazole (PRILOSEC) 20 MG capsule Take 1 capsule (20 mg total) by mouth daily. 30 capsule 11   triamcinolone cream (KENALOG) 0.1 % Apply 1 Application topically 2 (two) times daily. For itchy rash 30 g 1   No current facility-administered medications for this visit.   Allergies  Allergen Reactions   Metformin And Related Other (See Comments)    Unknown   Penicillins Rash    Has patient had a PCN reaction causing immediate rash, facial/tongue/throat swelling, SOB or lightheadedness with hypotension: Yes Has patient had a PCN reaction causing severe rash involving mucus membranes or skin necrosis: No Has patient had a PCN reaction that required hospitalization: NO Has patient had a PCN reaction occurring within the last 10 years: No If all of the above answers are "NO", then may proceed with Cephalosporin use.  Varenicline Itching     Review of Systems: All systems reviewed and negative except where noted in HPI.    No results found.  Physical Exam: BP 110/70 (BP Location: Left Arm, Patient Position: Sitting, Cuff Size: Normal)   Pulse 87   Ht 5\' 5"  (1.651 m)   Wt 114 lb 2 oz (51.8 kg)   SpO2 100%   BMI 18.99 kg/m  Constitutional: Pleasant, very thin, frail, African-American female in no acute distress.  Accompanied by 2 daughters HEENT: Normocephalic and atraumatic. Conjunctivae are normal. No scleral icterus. Neck supple.  Cardiovascular: Normal rate, regular rhythm.  Soft systolic murmur Pulmonary/chest: Effort normal and breath sounds normal. No wheezing, rales or rhonchi. Abdominal: Soft, nondistended, nontender. Bowel sounds active throughout. There are no  masses palpable. No hepatomegaly. Extremities: no edema Neurological: Alert and oriented to person place and time.  Patient required 2 person assistance to rise from a chair and ascend exam table.  She uses a forward-wheeled walker for ambulation Skin: Skin is warm and dry. No rashes noted. Psychiatric: Normal mood and affect. Behavior is normal.  CBC    Component Value Date/Time   WBC 5.6 01/07/2022 1110   RBC 4.12 01/07/2022 1110   HGB 11.1 (L) 01/07/2022 1110   HCT 35.1 (L) 01/07/2022 1110   PLT 204 01/07/2022 1110   MCV 85.2 01/07/2022 1110   MCH 26.9 01/07/2022 1110   MCHC 31.6 01/07/2022 1110   RDW 14.7 01/07/2022 1110   LYMPHSABS 1.6 01/07/2022 1110   MONOABS 0.4 01/07/2022 1110   EOSABS 0.2 01/07/2022 1110   BASOSABS 0.0 01/07/2022 1110    CMP     Component Value Date/Time   NA 142 01/07/2022 1110   K 3.7 01/07/2022 1110   CL 110 01/07/2022 1110   CO2 25 01/07/2022 1110   GLUCOSE 115 (H) 01/07/2022 1110   BUN 14 01/07/2022 1110   CREATININE 0.94 01/07/2022 1110   CALCIUM 9.9 01/07/2022 1110   PROT 7.3 01/07/2022 1110   ALBUMIN 3.9 01/07/2022 1110   AST 23 01/07/2022 1110   ALT 14 01/07/2022 1110   ALKPHOS 49 01/07/2022 1110   BILITOT 0.8 01/07/2022 1110   GFRNONAA >60 01/07/2022 1110   GFRAA >60 01/03/2020 1505       Latest Ref Rng & Units 01/07/2022   11:10 AM 01/06/2022    3:36 PM 01/06/2022    3:35 PM  CBC EXTENDED  WBC 4.0 - 10.5 K/uL 5.6     RBC 3.87 - 5.11 MIL/uL 4.12     Hemoglobin 12.0 - 15.0 g/dL 09.8  11.9  14.7   HCT 36.0 - 46.0 % 35.1  33.0  34.0   Platelets 150 - 400 K/uL 204     NEUT# 1.7 - 7.7 K/uL 3.4     Lymph# 0.7 - 4.0 K/uL 1.6         ASSESSMENT AND PLAN: 82 year old female referred for unintentional weight loss, nausea/vomiting and abdominal pain.  The symptoms were more prominent earlier this year, but seem to be improving.  She states she has not had any vomiting or severe abdominal pain since June.  Her weight has been stable  over the past few months and starting to increase a little bit.  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  The symptoms have improved.  It is very unlikely that her unintentional weight loss is related to a GI malignancy given the  prolonged duration of symptoms and recent stabilization of her weight and improvement in her symptoms.  A CT scan was negative for any metastatic disease. She continues to have issues with abdominal bloating and gas and occasional loose stools.  I suspect this is related to lactose intolerance, as the patient endorses regular consumption of ice cream and Ensure which is dairy based. Given the patient's very frail status, I think that she would be at high risk for procedural complications.  As I think the likely yield of finding an explanation for her weight loss and symptoms is low with an endoscopic evaluation, and she seems to be improving spontaneously, I recommended we defer any endoscopic evaluation at this time.    Weight Loss Chronic weight loss over the past year, now stabilized at 114 lbs with minor fluctuations. Differential includes malignancy, malabsorption, and vascular insufficiency. Malignancy is less likely given weight stabilization and unremarkable CT scan. Discussed low yield of endoscopy and associated risks. - Refer to nutritionist for dietary management and calorie optimization - Recommend lactose-free dietary options - Order prealbumin, iron panel, B12, folate, and vitamin D levels - Follow-up in two months to reassess weight and nutritional status  Abdominal Pain Intermittent postprandial abdominal pain, likely due to mesenteric artery narrowing. Symptoms include nausea and vomiting, improved significantly since June. Possible lactose intolerance contribution. Noninvasive management preferred given symptom improvement. - Order H. pylori stool test  Lactose  Intolerance Diarrhea and bloating after dairy consumption, likely contributing to gastrointestinal symptoms. Discussed benefits of lactose-free diet. - Recommend lactose-free dietary options - Refer to nutritionist for additional dietary recommendations to maximize nutrition/caloric intake while avoiding dairy  Anemia Anemia present since at least 2018, possibly related to nutritional deficiencies or chronic disease. Low likelihood of GI malignancy given weight stabilization and endoscopy risks. - Order iron panel, B12, and folate levels   Follow-up - Follow-up in two months to reassess weight, nutritional status, and gastrointestinal symptoms.     I spent a total of 50 minutes reviewing the patient's medical record, interviewing and examining the patient, discussing her diagnosis and management of her condition going forward, and documenting in the medical record   Reymundo Poll, MD

## 2023-04-13 LAB — PREALBUMIN: Prealbumin: 35 mg/dL — ABNORMAL HIGH (ref 17–34)

## 2023-04-14 ENCOUNTER — Other Ambulatory Visit: Payer: Medicare HMO

## 2023-04-14 ENCOUNTER — Other Ambulatory Visit (HOSPITAL_COMMUNITY): Payer: Self-pay

## 2023-04-14 ENCOUNTER — Other Ambulatory Visit: Payer: Self-pay

## 2023-04-14 ENCOUNTER — Ambulatory Visit: Payer: Medicare HMO | Admitting: Internal Medicine

## 2023-04-14 ENCOUNTER — Other Ambulatory Visit: Payer: Self-pay | Admitting: Dietician

## 2023-04-14 VITALS — BP 138/80 | HR 80 | Temp 98.2°F | Ht 65.0 in | Wt 114.8 lb

## 2023-04-14 DIAGNOSIS — K5904 Chronic idiopathic constipation: Secondary | ICD-10-CM

## 2023-04-14 DIAGNOSIS — G8929 Other chronic pain: Secondary | ICD-10-CM

## 2023-04-14 DIAGNOSIS — R634 Abnormal weight loss: Secondary | ICD-10-CM | POA: Diagnosis not present

## 2023-04-14 DIAGNOSIS — E119 Type 2 diabetes mellitus without complications: Secondary | ICD-10-CM

## 2023-04-14 DIAGNOSIS — R54 Age-related physical debility: Secondary | ICD-10-CM

## 2023-04-14 DIAGNOSIS — E1159 Type 2 diabetes mellitus with other circulatory complications: Secondary | ICD-10-CM

## 2023-04-14 DIAGNOSIS — M545 Low back pain, unspecified: Secondary | ICD-10-CM

## 2023-04-14 DIAGNOSIS — I152 Hypertension secondary to endocrine disorders: Secondary | ICD-10-CM

## 2023-04-14 DIAGNOSIS — D649 Anemia, unspecified: Secondary | ICD-10-CM

## 2023-04-14 DIAGNOSIS — F321 Major depressive disorder, single episode, moderate: Secondary | ICD-10-CM | POA: Diagnosis not present

## 2023-04-14 DIAGNOSIS — E43 Unspecified severe protein-calorie malnutrition: Secondary | ICD-10-CM | POA: Diagnosis not present

## 2023-04-14 DIAGNOSIS — R109 Unspecified abdominal pain: Secondary | ICD-10-CM

## 2023-04-14 MED ORDER — LIDOCAINE 5 % EX PTCH
1.0000 | MEDICATED_PATCH | Freq: Two times a day (BID) | CUTANEOUS | 2 refills | Status: DC
  Filled 2023-04-14: qty 10, 5d supply, fill #0
  Filled 2023-06-23: qty 10, 5d supply, fill #1

## 2023-04-14 MED ORDER — GABAPENTIN 300 MG PO CAPS
300.0000 mg | ORAL_CAPSULE | Freq: Every day | ORAL | 11 refills | Status: DC
  Filled 2023-04-14 – 2023-04-18 (×2): qty 30, 30d supply, fill #0
  Filled 2023-05-18 – 2023-05-26 (×3): qty 30, 30d supply, fill #1
  Filled 2023-06-06 – 2023-06-20 (×2): qty 30, 30d supply, fill #2
  Filled 2023-07-11 – 2023-07-19 (×2): qty 30, 30d supply, fill #3
  Filled 2023-08-02 – 2023-08-11 (×2): qty 30, 30d supply, fill #4
  Filled 2023-08-15 – 2023-09-07 (×2): qty 30, 30d supply, fill #5
  Filled 2023-09-26 – 2023-10-04 (×2): qty 30, 30d supply, fill #6
  Filled 2023-10-21 – 2023-10-27 (×2): qty 30, 30d supply, fill #7
  Filled 2023-11-16 – 2023-12-08 (×2): qty 30, 30d supply, fill #8
  Filled 2023-12-26 – 2024-01-05 (×2): qty 30, 30d supply, fill #9
  Filled 2024-02-07: qty 30, 30d supply, fill #10
  Filled 2024-03-08: qty 30, 30d supply, fill #11

## 2023-04-14 MED ORDER — MIRTAZAPINE 15 MG PO TABS: 15.0000 mg | ORAL_TABLET | Freq: Every day | ORAL | 11 refills | Status: DC

## 2023-04-14 MED ORDER — MIRTAZAPINE 15 MG PO TABS
15.0000 mg | ORAL_TABLET | Freq: Every day | ORAL | 11 refills | Status: DC
  Filled 2023-04-14 – 2023-04-18 (×2): qty 30, 30d supply, fill #0
  Filled 2023-05-18 – 2023-05-26 (×3): qty 30, 30d supply, fill #1
  Filled 2023-06-06 – 2023-06-20 (×2): qty 30, 30d supply, fill #2
  Filled 2023-07-11 – 2023-07-19 (×2): qty 30, 30d supply, fill #3
  Filled 2023-08-02 – 2023-08-11 (×2): qty 30, 30d supply, fill #4
  Filled 2023-08-15 – 2023-09-07 (×2): qty 30, 30d supply, fill #5
  Filled 2023-09-26 – 2023-10-04 (×2): qty 30, 30d supply, fill #6
  Filled 2023-10-21 – 2023-10-27 (×2): qty 30, 30d supply, fill #7
  Filled 2023-11-16 – 2023-12-08 (×2): qty 30, 30d supply, fill #8
  Filled 2023-12-26 – 2024-01-05 (×2): qty 30, 30d supply, fill #9
  Filled 2024-02-07: qty 30, 30d supply, fill #10
  Filled 2024-03-08: qty 30, 30d supply, fill #11

## 2023-04-14 NOTE — Assessment & Plan Note (Signed)
141/56 on amlodipine 10 mg daily which is very acceptable under the circumstances of her advanced frailty.  Would avoid diuretics as she has difficulty maintaining hydration generally.

## 2023-04-14 NOTE — Assessment & Plan Note (Signed)
[facility] enrollment underway, with anticipated enrollment 06/08/2023.

## 2023-04-14 NOTE — Assessment & Plan Note (Addendum)
Discussed topical lidocaine.  Lidoderm patches prescribed to be applied to the most painful areas of her spine.  If these are effective, I will write a prescription of longer duration.  Duloxetine is an option-it is possible she has been on this before at [facility].  She is already receiving physical therapy which has not been helpful in alleviating her pain.  She cannot noticed much improvement with the visits being only once weekly.  Will continue to work on this.  Safety is my greatest concern given her significant fall risk.  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

## 2023-04-14 NOTE — Assessment & Plan Note (Signed)
See entry for protein calorie malnutrition

## 2023-04-14 NOTE — Addendum Note (Signed)
Addended by: Charissa Bash A on: 04/14/2023 04:24 PM   Modules accepted: Orders

## 2023-04-14 NOTE — Patient Instructions (Addendum)
Ms. Shands,  You gained two pounds!!! Now you need to gain 20 more!!! I'm referring you to a dietician to help with optimizing your nutrition.  We need protein, fiber, water, and fruits and vegies.  Once you start eating fruits and veg and increasing your fluid intake, your bowels should start moving more regularly.  I will see you in a month for a recheck.  For your back pain, we will try lidocaine patches, prescription strength.  You will just need one a day.  If they work, we will prescribe a longer supply.  I'm happy you are reconsidering [facility].    Take care, don't fall, and stay well.  Dr. Mayford Knife

## 2023-04-14 NOTE — Progress Notes (Signed)
Referral request per Dr. Mayford Knife.

## 2023-04-14 NOTE — Assessment & Plan Note (Signed)
She has gained 2 pounds since last visit a month ago.  Still eating very little, mostly cereal with milk.  She has no appetite.  We reviewed medicines and noticed that I have made a mistake with her mirtazapine prescription and sent it to the incorrect pharmacy, so that it was not included in her adherence packaging from [facility].  She has thus not been receiving any benefit.  This has been addressed.  We discussed the importance of balanced meal, emphasizing protein.  She is also lacking any fruits and vegetables and has minimal fiber.  We agreed that she would try for breakfast a banana, prune juice and hard-boiled egg for 3 prunes hard-boiled egg and water together with a piece of toast each morning.  This would be a great accomplishment if she can follow through.  Daughters will help make sure that eggs are boiled safely so that the Sydney Gilbert does not have to do this herself.  I will see if we can get Sydney Gilbert into see dietitian [facility].  There is concern about lactose intolerance.  Family is moving towards almond milk (soy milk may offer more protein, I am not sure).  Goal is to gain 4 pounds in the next month.

## 2023-04-14 NOTE — Assessment & Plan Note (Signed)
Not well-managed.  Continues lubiprostone but bowel movements are hard and pebbly.  We will work first on fluid and fiber intake (food and water) before moving towards MiraLAX or other agent.  It is difficult for her to do transfers independently and frequent trips to the bathroom are not desirable.

## 2023-04-14 NOTE — Progress Notes (Signed)
Sydney Gilbert, All of your labs looked great.  You have normal levels of folate, vitamin B-12, iron and vitamin D.  Your prealbumin level was not low, which would be an indication of malnutrition.  Your level was slightly elevated, which does not have any significance. I would meet with the nutritionist as discussed to help you determine what foods can provide the most nutrition while reducing your symptoms of suspected lactose intolerance.

## 2023-04-14 NOTE — Assessment & Plan Note (Signed)
Diet remains inadequate for health.  Though A1c is low, her intake is largely simple carbs.  Will refer to dietitian.

## 2023-04-14 NOTE — Progress Notes (Signed)
82 y.o. Sydney Gilbert is here for close follow-up of unintentional weight loss, chronic abdominal symptoms, severe chronic back pain, and frailty with concern about continued ability to live independently.    At our last visit she was also noted to have a pruritic dermatitis on elbows for which topical steroid was recommended. She is accompanied today by her daughters Sydney Gilbert and Sydney Gilbert who live out of state.  Since our last visit patient has seen GI Dr. Tomasa Rand for opinion regarding her weight loss (malignancy, chronic mesenteric ischemia, malabsorption have been considerations). Dr. Salena Saner. Intended to refer her to a nutritionist, and was testing stool for H pylori. He had requested a one month f/u for symptom and weight check.   Family is working on getting Sydney Gilbert reenrolled in [place] were there yesterday and hope to be enrolled by 06/08/2023.  Patient Active Problem List   Diagnosis Date Noted   Dermatitis 03/26/2023   Constipation 03/04/2023   Chronic lower back pain 03/04/2023   Weight loss, abnormal 02/24/2023   Frailty syndrome in geriatric patient 02/24/2023   Cognitive impairment 01/14/2023   Aortic regurgitation 01/14/2023   Dyspepsia 01/14/2023   Protein calorie malnutrition (HCC) 01/14/2023   Impaired functional mobility, balance, and endurance 01/14/2023   Atypical chest pain 01/14/2023   Depression    Type 2 diabetes, diet controlled (HCC) 09/03/2019   Hypertension associated with diabetes (HCC) 09/03/2019   HLD (hyperlipidemia) 09/03/2019    Current Outpatient Medications:    lidocaine (LIDODERM) 5 %, Place 1 patch onto the skin every 12 (twelve) hours. Remove & Discard patch within 12 hours or as directed by MD, Disp: 10 patch, Rfl: 2   acetaminophen (TYLENOL 8 HOUR) 650 MG CR tablet, Take 2 tablets (1,300 mg total) by mouth 2 (two) times daily., Disp: 120 tablet, Rfl: 11   amLODipine (NORVASC) 10 MG tablet, Take 1 tablet (10 mg total) by mouth daily., Disp: 90  tablet, Rfl: 3   cholecalciferol (VITAMIN D3) 25 MCG (1000 UNIT) tablet, Take 1 tablet (1,000 Units total) by mouth daily., Disp: 90 tablet, Rfl: 3   gabapentin (NEURONTIN) 300 MG capsule, Take 1 capsule (300 mg total) by mouth at bedtime., Disp: 30 capsule, Rfl: 11   lubiprostone (AMITIZA) 8 MCG capsule, Take 1 capsule (8 mcg total) by mouth 2 (two) times daily with a meal., Disp: 60 capsule, Rfl: 11   mirtazapine (REMERON) 15 MG tablet, Take 1 tablet (15 mg total) by mouth at bedtime., Disp: 60 tablet, Rfl: 11   omeprazole (PRILOSEC) 20 MG capsule, Take 1 capsule (20 mg total) by mouth daily., Disp: 30 capsule, Rfl: 11   triamcinolone cream (KENALOG) 0.1 %, Apply 1 Application topically 2 (two) times daily. For itchy rash, Disp: 30 g, Rfl: 1  Functional Status: Impaired; lives with an older sister who is experiencing dementia.  No local family.  Walks with rollator was formerly a Chartered certified accountant at [employer]; enrollment ended when her monthly payment rose upon the death of her husband (she received death benefits which increased her copay). I have strongly advised the family to reconsider enrollment so that she might receive wrap-around services.  [name] currently has PT visiting her once a week and has an personal care assistant/HHA 3 times a week for 2 hours to assist with bathing and odd jobs.  She does not assist with food prep and is largely a social visit.  Objective BP (!) 141/56 (BP Location: Right Arm, Patient Position: Sitting, Cuff Size:  Small)   Pulse 84   Temp 98.2 F (36.8 C) (Oral)   Ht 5\' 5"  (1.651 m)   Wt 114 lb 12.8 oz (52.1 kg)   SpO2 100%   BMI 19.10 kg/m   Exam: Sydney Gilbert arrives in a transport chair.  She is awake and alert, nicely dressed and groomed, sitting with forward flexion as always, looking very frail.  Skin turgor is reduced throughout.  Extremities are warm and well-perfused.  She continues to exhibit marked generalized sarcopenia.  Medial elbows  notable for hyperpigmentation remaining in the area where her pruritic rash was, now healed.  Similar patch is visible on right anterior lower leg where again skin is smooth and moisturized without discrete lesions or excoriation.  Problems addressed this visit:  Chronic bilateral low back pain, unspecified whether sciatica present Assessment & Plan: Discussed topical lidocaine.  Lidoderm patches prescribed to be applied to the most painful areas of her spine.  If these are effective, I will write a prescription of longer duration.  Duloxetine is an option-it is possible she has been on this before at [facility].  She is already receiving physical therapy which has not been helpful in alleviating her pain.  She cannot noticed much improvement with the visits being only once weekly.  Will continue to work on this.  Safety is my greatest concern given her significant fall risk.  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 Orders: -     Lidocaine; Place 1 patch onto the skin every 12 (twelve) hours. Remove & Discard patch within 12 hours or as directed by MD  Dispense: 10 patch; Refill: 2 -     Gabapentin; Take 1 capsule (300 mg total) by mouth at bedtime.  Dispense: 30 capsule; Refill: 11  Current moderate episode of major depressive disorder, unspecified whether recurrent (HCC) and poor appetite -     Mirtazapine; Take 1 tablet (15 mg total) by mouth at bedtime.  Dispense: 60 tablet; Refill: 11  Severe protein-calorie malnutrition (HCC) Assessment & Plan: She has gained 2 pounds since last visit a month ago.  Still eating very little, mostly cereal with milk.  She has no appetite.  We reviewed medicines and noticed that I have made a mistake with her mirtazapine prescription and sent it to the incorrect pharmacy, so that it was not included in her adherence packaging from [facility]  She has thus not  been receiving any benefit.  This has been addressed.  We discussed the importance of balanced meal, emphasizing protein.  She is also lacking any fruits and vegetables and has minimal fiber.  We agreed that she would try for breakfast a banana, prune juice and hard-boiled egg for 3 prunes hard-boiled egg and water together with a piece of toast each morning.  This would be a great accomplishment if she can follow through.  Daughters will help make sure that eggs are boiled safely so that the Ms. Frazzini does not have to do this herself.  I will see if we can get Ms. Mary into see dietitian [facility].  There is concern about lactose intolerance.  Family is moving towards almond milk (soy milk may offer more protein, I am not sure).  Goal is to gain 4 pounds in the next month.  Weight loss, abnormal Assessment & Plan: See entry for protein calorie malnutrition  Chronic idiopathic constipation  Assessment & Plan: Not well-managed.  Continues lubiprostone but bowel movements are hard and pebbly.  We will work first on fluid and fiber intake (food and water) before moving towards MiraLAX or other agent.  It is difficult for her to do transfers independently and frequent trips to the bathroom are not desirable.  Frailty syndrome in geriatric patient Assessment & Plan: [facility] enrollment underway, with anticipated enrollment 06/08/2023.  Hypertension associated with diabetes (HCC) Assessment & Plan: 141/56 on amlodipine 10 mg daily which is very acceptable under the circumstances of her advanced frailty.  Would avoid diuretics as she has difficulty maintaining hydration generally.  Type 2 diabetes, diet controlled (HCC) Assessment & Plan: Diet remains inadequate for health.  Though A1c is low, her intake is largely simple carbs.  Will refer to dietitian.      Return in about 4 weeks (around 05/12/2023) for chronic condition monitoring, functional reassessment, weight.

## 2023-04-15 ENCOUNTER — Other Ambulatory Visit: Payer: Self-pay

## 2023-04-15 LAB — HELICOBACTER PYLORI  SPECIAL ANTIGEN
MICRO NUMBER:: 15700232
SPECIMEN QUALITY: ADEQUATE

## 2023-04-18 ENCOUNTER — Other Ambulatory Visit (HOSPITAL_COMMUNITY): Payer: Self-pay

## 2023-04-18 ENCOUNTER — Other Ambulatory Visit: Payer: Self-pay

## 2023-04-19 ENCOUNTER — Other Ambulatory Visit: Payer: Self-pay

## 2023-04-19 ENCOUNTER — Other Ambulatory Visit (HOSPITAL_COMMUNITY): Payer: Self-pay

## 2023-04-19 NOTE — Progress Notes (Signed)
Sydney Gilbert,  Your test for H. Pylori infection was negative (not detected).  I think your symptoms will get better if you reduce/eliminate dairy from your diet.

## 2023-04-20 ENCOUNTER — Other Ambulatory Visit: Payer: Self-pay

## 2023-04-26 ENCOUNTER — Telehealth: Payer: Self-pay | Admitting: *Deleted

## 2023-04-26 NOTE — Telephone Encounter (Signed)
Call from [facility] requesting PT 1 time a week for 8 weeks. Work on Development worker, international aid, Walking and Back Pain.  Verbal OK given . Will forward to PCP.

## 2023-04-26 NOTE — Telephone Encounter (Signed)
agree

## 2023-05-03 ENCOUNTER — Other Ambulatory Visit: Payer: Self-pay

## 2023-05-18 ENCOUNTER — Other Ambulatory Visit: Payer: Self-pay

## 2023-05-21 ENCOUNTER — Other Ambulatory Visit: Payer: Self-pay

## 2023-05-23 ENCOUNTER — Telehealth: Payer: Self-pay | Admitting: *Deleted

## 2023-05-23 ENCOUNTER — Other Ambulatory Visit: Payer: Self-pay

## 2023-05-23 ENCOUNTER — Other Ambulatory Visit (HOSPITAL_COMMUNITY): Payer: Self-pay

## 2023-05-23 NOTE — Telephone Encounter (Signed)
Received a call from Kreg Shropshire PT with [facility] who saw pt today. Stated pt has a drastic decline in her PT since being seen 3 weeks ago. Stated pt c/o lower back pain, new groin pain, LE weakness, knees buckling. Pt told her she has not been  walking a lot  lately d/t fear of falling. Also Jae Dire is requesting verbal order to "Increase PT 2 times a week x 3 weeks"- I told her I need to send her request to the doctor.

## 2023-05-24 ENCOUNTER — Encounter: Payer: Medicare HMO | Admitting: Internal Medicine

## 2023-05-24 ENCOUNTER — Other Ambulatory Visit: Payer: Self-pay

## 2023-05-25 ENCOUNTER — Other Ambulatory Visit: Payer: Self-pay

## 2023-05-25 NOTE — Telephone Encounter (Signed)
Paulino Door PT - no answer; I left verbal order for "Increase PT 2 times a week x 3 weeks" on her secure vm.

## 2023-05-26 ENCOUNTER — Other Ambulatory Visit: Payer: Self-pay

## 2023-05-26 ENCOUNTER — Encounter: Payer: Medicare HMO | Admitting: Dietician

## 2023-05-26 ENCOUNTER — Encounter: Payer: Medicare HMO | Admitting: Internal Medicine

## 2023-06-06 ENCOUNTER — Other Ambulatory Visit (HOSPITAL_COMMUNITY): Payer: Self-pay

## 2023-06-09 ENCOUNTER — Ambulatory Visit: Payer: Medicare HMO | Admitting: Gastroenterology

## 2023-06-15 ENCOUNTER — Other Ambulatory Visit: Payer: Self-pay

## 2023-06-17 ENCOUNTER — Other Ambulatory Visit: Payer: Self-pay

## 2023-06-17 ENCOUNTER — Other Ambulatory Visit (HOSPITAL_COMMUNITY): Payer: Self-pay

## 2023-06-20 ENCOUNTER — Other Ambulatory Visit: Payer: Self-pay

## 2023-06-21 ENCOUNTER — Other Ambulatory Visit (HOSPITAL_COMMUNITY): Payer: Self-pay

## 2023-06-21 ENCOUNTER — Other Ambulatory Visit: Payer: Self-pay

## 2023-06-22 NOTE — Progress Notes (Signed)
83 y.o. Sydney Gilbert is here for routine follow-up of multiple chronic conditions but more concerning for follow up of her functional decline, weight loss, chronic pain, and any progress with re-enrolling in [employer] [facility]. Her most recent visit was in 04/2023; she was referred to dietician Ms. Plyler for weight gain strategies (appt is scheduled today after our visit concludes). She has had no other medical appointments in the interim.  Has been eating better and has gained weight; daughter has been helping with protein sources.  She is now receiving meds in adherence packaging delivered by [facility], which has been very helpful.    Chronic inoperable back pain remains the greatest problem. Ms. Vea despite her children's encouragement has chosen not to enroll in [facility] - she is of the opinion that she doesn't have adequate finances (she does, with > 1000$/mo remaining).  Apparently she wants to look into a program/center that a friend told her about.  She doesn't know the name.  Had a mixup with insurance during open enrollment, became signed up with [facility] w/o her knowledge, daughter cancelled, trying to get her onto [facility] Medicare (07/09/23).    All abdominal symptoms (early satiety, N/V, pain) have completely resolved.  Bowels are moving well and sometimes she has loose stools; she wonders about the Ensure (drinking 2-3 today on our advice). started drinking prune juice every morning after last visit. Changed from dairy to almond milk.  Has f/u appt with Dr. Tomasa Rand, early Feb; son Arlyne Ehler from [place] will be down for that visit, and is helping with the PT piece since insurance got mixed up.     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   Constipation, chronic 03/04/2023   Chronic lower back pain 03/04/2023   Weight loss, abnormal 02/24/2023   Frailty syndrome in geriatric patient 02/24/2023   Cognitive impairment 01/14/2023   Aortic  regurgitation 01/14/2023   Dyspepsia 01/14/2023   Protein calorie malnutrition (HCC) 01/14/2023   Impaired functional mobility, balance, and endurance 01/14/2023   Depression    Type 2 diabetes, diet controlled (HCC) 09/03/2019   Hypertension associated with diabetes (HCC) 09/03/2019   HLD (hyperlipidemia) 09/03/2019   Acquired kyphoscoliosis 06/24/2017   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   Current Outpatient Medications:    acetaminophen (TYLENOL 8 HOUR) 650 MG CR tablet, Take 2 tablets (1,300 mg total) by mouth 2 (two) times daily., Disp: 120 tablet, Rfl: 11   amLODipine (NORVASC) 10 MG tablet, Take 1 tablet (10 mg total) by mouth daily., Disp: 90 tablet, Rfl: 3   cholecalciferol (VITAMIN D3) 25 MCG (1000 UNIT) tablet, Take 1 tablet (1,000 Units total) by mouth daily., Disp: 90 tablet, Rfl: 3   gabapentin (NEURONTIN) 300 MG capsule, Take 1 capsule (300 mg total) by mouth at bedtime., Disp: 30 capsule, Rfl: 11   lidocaine (LIDODERM) 5 %, Place 1 patch onto the skin every 12 (twelve) hours. Remove & Discard patch within 12 hours or as directed by MD, Disp: 10 patch, Rfl: 2   lubiprostone (AMITIZA) 8 MCG capsule, Take 1 capsule (8 mcg total) by mouth daily with breakfast., Disp: 60 capsule, Rfl: 11   mirtazapine (REMERON) 15 MG tablet, Take 1 tablet (15 mg total) by mouth at bedtime., Disp: 30 tablet, Rfl: 11   omeprazole (PRILOSEC) 20 MG capsule, Take 1 capsule (20 mg total) by mouth daily., Disp: 30 capsule, Rfl: 11   triamcinolone cream (KENALOG) 0.1 %, Apply 1  Application topically 2 (two) times daily. For itchy rash, Disp: 30 g, Rfl: 1  Functional Status/Support: Impaired physical function and is assisted with IADLs as well.  Lives with an older sister who is experiencing dementia, though this sister does most of the cooking.  No local family but her children are very involved and usually make the trips to [facility] for medical appts and to help stock groceries and help with  shopping (son Cristal Deer in [place], son Lyda Jester in [place], [name].  Walks with rollator. Was formerly a participant at [employer]; enrollment ended when her monthly payment rose upon the death of her husband (she received death benefits which increased her copay).  Had PT which was cancelled as a consequence of her recent insurance snafue (her son is working on this).  Cancelled MOW as she was unable to eat the meals. Her privately employed aide has moved away and there is no one currently checking on the ladies.  Objective BP (!) 134/56 (BP Location: Right Arm, Patient Position: Sitting, Cuff Size: Small)   Pulse 90   Temp 98.4 F (36.9 C) (Oral)   Ht 5\' 5"  (1.651 m)   Wt 122 lb 9.6 oz (55.6 kg)   SpO2 100%   BMI 20.40 kg/m   Exam:  Ms. Behner arrives in a transport chair.  She is awake and alert, nicely dressed and groomed, sitting with forward flexion as always.  She appears less frail and indeed healthier. Skin turgor has improved; only midly reduced now. Extremities are warm and well-perfused.  She continues to exhibit marked generalized sarcopenia.  Medial elbows notable for hyperpigmentation remaining in the area where her pruritic rash was, now healed.  Similar patches are visible on both anterior lower legs. In standing position she is bent forward nearly at right angle. Slow steady gait with her walker.  Her line of site forward is very limited.    Problems addressed today: Chronic idiopathic constipation Assessment & Plan: Bowels are moving very regularly, and are sometimes loose.  She is taking lubiprostone 8 mg bid.  Drinking prune juice every morning.  Drinks Ensure tid.   WE will reduce lubi to once a day 8 mg.  We discussed how to identify the pill and remove it from the evening packet. She will let Dr. Tomasa Rand know how the bowels are behaving at her f/u in 2 weeks. We may be able to eliminate the lubiprostone in favor of miralax w/wo senna.  -     Lubiprostone; Take 1  capsule (8 mcg total) by mouth daily with breakfast.  Dispense: 60 capsule; Refill: 11  Moderate protein-calorie malnutrition (HCC) Assessment & Plan: 8# weight gain in past two months!  "I'm eating". Daughter has helped emphasize the importance of protein.  Drinking 2-3 Ensures daily.  Frozen protein sources are available to be microwaved, and her sister cooks a bit.  She looks healthier today.  Will meet with dietician after our visit today. Continue mirtazapine 15 mg nightly for appetite and mood.  Weight loss, abnormal Assessment & Plan: See Protein calorie malnutrition  Impaired functional mobility, balance, and endurance Assessment & Plan: No falls.  Her center of gravity is far forward due to her severe kyphosis.  She uses her rollator always (unable to walk without).  Risky maneuvers are when she is preparing food in the kitchen - must lean torso forward on the counter while she uses the R hand to prepare food.  Hopeful that PT  can be reinstated after insurance snafu is sorted out.  Currently has no aide (moved away; privately employed).    Hypertension associated with diabetes (HCC) Assessment & Plan: 134/56 on amlodipine 10 mg daily (no LE edema).  Adherence packaging has helped.  Eczema, unspecified type Assessment & Plan: Extensor surface of elbows has improved; no longer rough skin surface but smooth; remains hyperpigmented.  New areas on anterior tibias, also rough and pruritic, hyperpigmented, completely flat lesions, no central clearing.  She will apply the triamcinolone cream here as well.  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  No personal history but strong family history of asthma.     *Many (if not all) of Ms. Mackiewicz's care gap quality measures were completed at an outside practice.    Return in about 2 months (around 08/21/2023) for functional reassessment, med review.

## 2023-06-23 ENCOUNTER — Ambulatory Visit (INDEPENDENT_AMBULATORY_CARE_PROVIDER_SITE_OTHER): Payer: Medicare Other | Admitting: Dietician

## 2023-06-23 ENCOUNTER — Other Ambulatory Visit (HOSPITAL_COMMUNITY): Payer: Self-pay

## 2023-06-23 ENCOUNTER — Other Ambulatory Visit: Payer: Self-pay

## 2023-06-23 ENCOUNTER — Encounter: Payer: Self-pay | Admitting: Internal Medicine

## 2023-06-23 ENCOUNTER — Ambulatory Visit: Payer: Medicare Other | Admitting: Internal Medicine

## 2023-06-23 ENCOUNTER — Encounter: Payer: Self-pay | Admitting: Dietician

## 2023-06-23 VITALS — BP 134/56 | HR 90 | Temp 98.4°F | Ht 65.0 in | Wt 122.6 lb

## 2023-06-23 VITALS — Wt 122.6 lb

## 2023-06-23 DIAGNOSIS — K5904 Chronic idiopathic constipation: Secondary | ICD-10-CM

## 2023-06-23 DIAGNOSIS — K5909 Other constipation: Secondary | ICD-10-CM

## 2023-06-23 DIAGNOSIS — K5900 Constipation, unspecified: Secondary | ICD-10-CM

## 2023-06-23 DIAGNOSIS — E119 Type 2 diabetes mellitus without complications: Secondary | ICD-10-CM

## 2023-06-23 DIAGNOSIS — E440 Moderate protein-calorie malnutrition: Secondary | ICD-10-CM | POA: Diagnosis not present

## 2023-06-23 DIAGNOSIS — I152 Hypertension secondary to endocrine disorders: Secondary | ICD-10-CM

## 2023-06-23 DIAGNOSIS — L309 Dermatitis, unspecified: Secondary | ICD-10-CM | POA: Diagnosis not present

## 2023-06-23 DIAGNOSIS — E1159 Type 2 diabetes mellitus with other circulatory complications: Secondary | ICD-10-CM

## 2023-06-23 DIAGNOSIS — Z7409 Other reduced mobility: Secondary | ICD-10-CM | POA: Diagnosis not present

## 2023-06-23 DIAGNOSIS — R634 Abnormal weight loss: Secondary | ICD-10-CM | POA: Diagnosis not present

## 2023-06-23 MED ORDER — LUBIPROSTONE 8 MCG PO CAPS
8.0000 ug | ORAL_CAPSULE | Freq: Every day | ORAL | 11 refills | Status: AC
  Filled 2023-06-23: qty 60, 60d supply, fill #0
  Filled 2023-06-23: qty 30, 30d supply, fill #0
  Filled 2023-07-11 – 2023-07-19 (×2): qty 30, 30d supply, fill #1
  Filled 2023-08-02 – 2023-08-11 (×2): qty 30, 30d supply, fill #2
  Filled 2023-08-15 – 2023-09-07 (×2): qty 30, 30d supply, fill #3
  Filled 2023-09-26 – 2023-10-04 (×2): qty 30, 30d supply, fill #4
  Filled 2023-10-21 – 2023-10-27 (×2): qty 30, 30d supply, fill #5
  Filled 2023-11-16 – 2023-12-08 (×2): qty 30, 30d supply, fill #6
  Filled 2023-12-26 – 2024-01-05 (×2): qty 30, 30d supply, fill #7
  Filled 2024-02-07: qty 30, 30d supply, fill #8
  Filled 2024-03-08: qty 30, 30d supply, fill #9
  Filled 2024-04-03: qty 30, 30d supply, fill #10
  Filled 2024-04-26: qty 30, 30d supply, fill #11
  Filled 2024-06-12: qty 30, 30d supply, fill #12

## 2023-06-23 NOTE — Progress Notes (Deleted)
Community Health Assessment    Sydney Gilbert

## 2023-06-23 NOTE — Assessment & Plan Note (Signed)
No falls.  Her center of gravity is far forward due to her severe kyphosis.  She uses her rollator always (unable to walk without).  Risky maneuvers are when she is preparing food in the kitchen - must lean torso forward on the counter while she uses the R hand to prepare food.  Hopeful that PT can be reinstated after insurance snafu is sorted out.  Currently has no aide (moved away; privately employed).

## 2023-06-23 NOTE — Assessment & Plan Note (Addendum)
Bowels are moving very regularly, and are sometimes loose.  She is taking lubiprostone 8 mg bid.  Drinking prune juice every morning.  Drinks Ensure tid.   WE will reduce lubi to once a day 8 mg.  We discussed how to identify the pill and remove it from the evening packet. She will let Dr. Tomasa Rand know how the bowels are behaving at her f/u in 2 weeks. We may be able to eliminate the lubiprostone in favor of miralax w/wo senna.

## 2023-06-23 NOTE — Progress Notes (Signed)
I reviewed and approve this note and the plan. Norm Parcel, RD 06/23/2023 3:43 PM.

## 2023-06-23 NOTE — Assessment & Plan Note (Signed)
134/56 on amlodipine 10 mg daily (no LE edema).  Adherence packaging has helped.

## 2023-06-23 NOTE — Assessment & Plan Note (Addendum)
8# weight gain in past two months!  "I'm eating". Daughter has helped emphasize the importance of protein.  Drinking 2-3 Ensures daily.  Frozen protein sources are available to be microwaved, and her sister cooks a bit.  She looks healthier today.  Will meet with dietician after our visit today. Continue mirtazapine 15 mg nightly for appetite and mood.

## 2023-06-23 NOTE — Assessment & Plan Note (Signed)
See Protein calorie malnutrition

## 2023-06-23 NOTE — Progress Notes (Signed)
  Medical Nutrition Therapy:   Norm Parcel, RD 06/23/2023 3:50 PM. .

## 2023-06-23 NOTE — Patient Instructions (Addendum)
Ms. Dehoff,  I'm so excited to see that you've gained weight!  Good job on improving your protein and general food intake.  Keep it up.  We discussed stopping the evening lubiprostone because you're having some loose stools.  Try to do this before your visit with Dr. Tomasa Rand, so that he can determine the next steps.   Thank you for coming in today.  Since you're not signing up with [facility] I will probably ask that you come in every couple of months.  See you next time!  Dr. Mayford Knife

## 2023-06-23 NOTE — Assessment & Plan Note (Addendum)
Extensor surface of elbows has improved; no longer rough skin surface but smooth; remains hyperpigmented.  New areas on anterior tibias, also rough and pruritic, hyperpigmented, completely flat lesions, no central clearing.  She will apply the triamcinolone cream here as well.  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  No personal history but strong family history of asthma.

## 2023-06-23 NOTE — Progress Notes (Signed)
Medical Nutrition Therapy:  Appt start time: 1050 end time:  1130. Total time: 40 min Visit # 1  Pt attributes recent wt loss to severe constipation and vomiting which resulted in her being hospitalized last year.   Bowels currently - pt states they are " pretty good " has one good bowel movement every day  drinks prune juice  Diabetes is currently well-controlled DM Symptoms ( Polydypsia , polyuria, dizziness,etc.): Denied symptoms of high blood sugars   Assessment:  Primary concerns today: Weight loss.  Has severe kyphosis,  Preferred Learning Style: No preference indicated  Learning Readiness: Ready Supplements : Vitamin D3 & Ensure  ANTHROPOMETRICS:Body mass index is 20.4 kg/m. UBW- 162lbs Pt states she hasn't been UBW since 3-4 years ago WEIGHT HISTORY:  Wt Readings from Last 20 Encounters:  06/23/23 122 lb 9.6 oz (55.6 kg)  06/23/23 122 lb 9.6 oz (55.6 kg)  04/14/23 114 lb 12.8 oz (52.1 kg)  04/11/23 114 lb 2 oz (51.8 kg)  03/24/23 112 lb 4.8 oz (50.9 kg)  03/24/23 112 lb 4.8 oz (50.9 kg)  02/24/23 113 lb 3.2 oz (51.3 kg)  01/14/23 114 lb (51.7 kg)  01/07/22 132 lb 14.4 oz (60.3 kg)  06/19/21 150 lb (68 kg)  01/03/20 145 lb (65.8 kg)  09/03/19 148 lb (67.1 kg)    SLEEP:Sleeps well ( approx. 10am-10am) MEDICATIONS: Amitza and Remeron Currently not on any Diabetes medications  BLOOD SUGAR: Declined , Not pertinent to visit Dx DIETARY INTAKE: Usual eating pattern includes 2 meals and 2 ensures a day  1 snacks per day (occasionally ) Everyday foods include Eggs, cheese .   Avoided foods include: Not disclosed.   Food Intolerances: Lactose - Has been directed to decrease intake by GI physician  Chewing: No issues , dentures:Yes , but doesn't wear them often and takes them out to eat   Constipation: previously ,but not currently active  Dining Out (times/week): unknown 24-hr recall:  B ( AM): Eggs and cheese , Cereal , 2 Toaster strudels  Orange juice , 2oz of  prune juice   L ( PM): 1 Ensure D ( PM): 1/2 can pork and beans and 1/2 can vienna sausages with 8-9 saltine crackers Diet Pepsi Another Ensure after dinner Snk ( PM): 1 bowl of popcorn (occasionally ) Beverages: Juice, prune juice, diet soda   Food Shopping: Her daughter goes food shopping ( every 3 months when she visits ) , used to have an aide take her , currently has a Agricultural consultant from church offering to assist with shopping and cooking meals  Cooking : Lives with sister both take turns cooking/ warming up food Usual physical activity: Unknown  Estimated energy needs:  1500-1700 calories   Progress Towards Goal(s):  In progress.   Nutritional Diagnosis:  [place]-3.2 Unintentional weight loss As related to constipation.  As evidenced by pt recent weight loss and pt report of reluctance to eat due to fear of stomach pains , constipation , and vomiting.    Intervention:  Nutrition . Action Goal: Drinking more fluids throughout the day ( juice /water), increasing consumption of fruits and vegetables, and consuming foods high in fiber to decrease the likelihood of constipation.   Outcome goal: Gain 5-10lbs   Teaching Method Utilized: Visual, Auditory,Hands on Handouts given during visit include:Tips to increase calories , Finger foods handout , and High fiber recipes handout.  Barriers to learning/adherence to lifestyle change: Kyphosis Dx  Demonstrated degree of understanding via:  Teach Back  Monitoring/Evaluation:  Dietary intake, GI symptoms , and body weight  2-3 months  . Sydney Gilbert, Sydney Gilbert 06/23/2023 3:29 PM.

## 2023-06-24 ENCOUNTER — Telehealth: Payer: Self-pay | Admitting: *Deleted

## 2023-06-24 ENCOUNTER — Other Ambulatory Visit: Payer: Self-pay

## 2023-06-24 NOTE — Telephone Encounter (Signed)
Call from [place], PT with [facility]. Would like to extend PT  2 times a week for 9 weeks.  Need to work on Surveyor, minerals with walking.  Verbal approval given will forward to PCP for approval or denial.

## 2023-07-11 ENCOUNTER — Encounter: Payer: Self-pay | Admitting: Gastroenterology

## 2023-07-11 ENCOUNTER — Ambulatory Visit: Payer: Medicare HMO | Admitting: Gastroenterology

## 2023-07-11 ENCOUNTER — Other Ambulatory Visit: Payer: Self-pay

## 2023-07-11 VITALS — BP 118/60 | HR 87 | Ht 67.0 in | Wt 124.0 lb

## 2023-07-11 DIAGNOSIS — K5900 Constipation, unspecified: Secondary | ICD-10-CM | POA: Diagnosis not present

## 2023-07-11 DIAGNOSIS — R634 Abnormal weight loss: Secondary | ICD-10-CM

## 2023-07-11 DIAGNOSIS — R159 Full incontinence of feces: Secondary | ICD-10-CM

## 2023-07-11 DIAGNOSIS — K5909 Other constipation: Secondary | ICD-10-CM

## 2023-07-11 DIAGNOSIS — R197 Diarrhea, unspecified: Secondary | ICD-10-CM

## 2023-07-11 NOTE — Progress Notes (Unsigned)
HPI : Sydney Gilbert is a 83 y.o. female who is referred to [place] by Miguel Aschoff, [place].      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 Family History  Problem Relation Age of Onset   Depression Mother    Lung cancer Father    Diabetes Sister    Social History   Tobacco Use   Smoking status: Every Day    Types: Cigarettes   Smokeless tobacco: Never  Substance Use Topics   Alcohol use: Not Currently   Drug use: Never   Current Outpatient Medications  Medication Sig Dispense Refill   acetaminophen (TYLENOL 8 HOUR) 650 MG CR tablet Take 2 tablets (1,300 mg total) by mouth 2 (two) times daily. 120 tablet 11   amLODipine (NORVASC) 10 MG tablet Take 1 tablet (10 mg total) by mouth daily. 90 tablet 3   cholecalciferol (VITAMIN D3) 25 MCG (1000 UNIT) tablet Take 1 tablet (1,000 Units total) by mouth daily. 90 tablet 3   gabapentin (NEURONTIN) 300 MG capsule Take 1 capsule (300 mg total) by mouth at bedtime. 30 capsule 11   lidocaine (LIDODERM) 5 % Place 1 patch onto the skin every 12 (twelve) hours. Remove & Discard patch within 12 hours or as directed by MD 10 patch 2   lubiprostone (AMITIZA) 8 MCG capsule Take 1 capsule (8 mcg total) by mouth daily with breakfast. 60 capsule 11   mirtazapine (REMERON) 15 MG tablet Take 1 tablet (15 mg total) by mouth at bedtime. 30 tablet 11   omeprazole (PRILOSEC) 20 MG capsule Take 1 capsule (20 mg total) by mouth daily. 30 capsule 11   triamcinolone cream (KENALOG) 0.1 % Apply 1 Application topically 2 (two) times daily. For itchy rash 30 g 1   No current facility-administered medications for this visit.   Allergies   Allergen Reactions   Metformin And Related Other (See Comments)    Unknown   Penicillins Rash    Has patient had a PCN reaction causing immediate rash, facial/tongue/throat swelling, SOB or lightheadedness with hypotension: Yes Has patient had a PCN reaction causing severe rash involving mucus membranes or skin necrosis: No Has patient had a PCN reaction that required hospitalization: NO Has patient had a PCN reaction occurring within the last 10 years: No If all of the above answers are "NO", then may proceed with Cephalosporin use.    Varenicline Itching     Review of Systems: All systems reviewed and negative except where noted in HPI.    No results found.  Physical Exam: There were no vitals taken for this visit. Constitutional: Pleasant,well-developed, ***female in no acute distress. HEENT: Normocephalic and atraumatic. Conjunctivae are normal. No scleral icterus. Neck supple.  Cardiovascular: Normal rate, regular rhythm.  Pulmonary/chest: Effort normal and breath sounds normal. No wheezing, rales or rhonchi. Abdominal: Soft, nondistended, nontender. Bowel sounds active throughout. There are no masses palpable. No hepatomegaly. Extremities: no edema Lymphadenopathy: No cervical adenopathy noted. Neurological: Alert and oriented to person place and time. Skin: Skin is warm and dry. No rashes noted. Psychiatric: Normal mood and affect. Behavior is normal.  CBC    Component Value Date/Time   WBC 5.6 01/07/2022 1110   RBC 4.12 01/07/2022 1110   HGB 11.1 (L) 01/07/2022 1110   HCT 35.1 (L) 01/07/2022 1110   PLT 204 01/07/2022 1110   MCV 85.2 01/07/2022 1110   MCH 26.9 01/07/2022 1110   MCHC 31.6 01/07/2022 1110   RDW 14.7 01/07/2022 1110   LYMPHSABS 1.6 01/07/2022 1110   MONOABS 0.4 01/07/2022 1110   EOSABS 0.2 01/07/2022 1110   BASOSABS 0.0 01/07/2022 1110    CMP     Component Value Date/Time   NA 142 01/07/2022 1110   K 3.7 01/07/2022 1110   CL 110  01/07/2022 1110   CO2 25 01/07/2022 1110   GLUCOSE 115 (H) 01/07/2022 1110   BUN 14 01/07/2022 1110   CREATININE 0.94 01/07/2022 1110   CALCIUM 9.9 01/07/2022 1110   PROT 7.3 01/07/2022 1110   ALBUMIN 3.9 01/07/2022 1110   AST 23 01/07/2022 1110   ALT 14 01/07/2022 1110   ALKPHOS 49 01/07/2022 1110   BILITOT 0.8 01/07/2022 1110   GFRNONAA >60 01/07/2022 1110   GFRAA >60 01/03/2020 1505       Latest Ref Rng & Units 01/07/2022   11:10 AM 01/06/2022    3:36 PM 01/06/2022    3:35 PM  CBC EXTENDED  WBC 4.0 - 10.5 K/uL 5.6     RBC 3.87 - 5.11 MIL/uL 4.12     Hemoglobin 12.0 - 15.0 g/dL 60.4  54.0  98.1   HCT 36.0 - 46.0 % 35.1  33.0  34.0   Platelets 150 - 400 K/uL 204     NEUT# 1.7 - 7.7 K/uL 3.4     Lymph# 0.7 - 4.0 K/uL 1.6         ASSESSMENT AND PLAN:  Miguel Aschoff, MD

## 2023-07-11 NOTE — Patient Instructions (Addendum)
Follow up as needed   Start taking Metamucil daily  If your blood pressure at your visit was 140/90 or greater, please contact your primary care physician to follow up on this.  _______________________________________________________  If you are age 83 or older, your body mass index should be between 23-30. Your Body mass index is 19.42 kg/m. If this is out of the aforementioned range listed, please consider follow up with your Primary Care Provider.  If you are age 35 or younger, your body mass index should be between 19-25. Your Body mass index is 19.42 kg/m. If this is out of the aformentioned range listed, please consider follow up with your Primary Care Provider.   ________________________________________________________  The [facility] providers would like to encourage you to use [facility] to communicate with providers for non-urgent requests or questions.  Due to long hold times on the telephone, sending your provider a message by [facility] may be a faster and more efficient way to get a response.  Please allow 48 business hours for a response.  Please remember that this is for non-urgent requests.  _______________________________________________________  Thank you for entrusting me with your care and choosing [facility].  Dr Tomasa Rand

## 2023-07-19 ENCOUNTER — Other Ambulatory Visit: Payer: Self-pay

## 2023-07-20 ENCOUNTER — Other Ambulatory Visit: Payer: Self-pay

## 2023-07-29 IMAGING — CR DG HIP (WITH OR WITHOUT PELVIS) 2-3V*L*
3 series · 3 of 3 positions shown · non-contrast
Comparison: Radiographs 10/17/2018.

CLINICAL DATA: Left posterior hip pain and limited weight-bearing
after falling.

EXAM:
DG HIP (WITH OR WITHOUT PELVIS) 2-3V LEFT

[t pelvis a.p.]
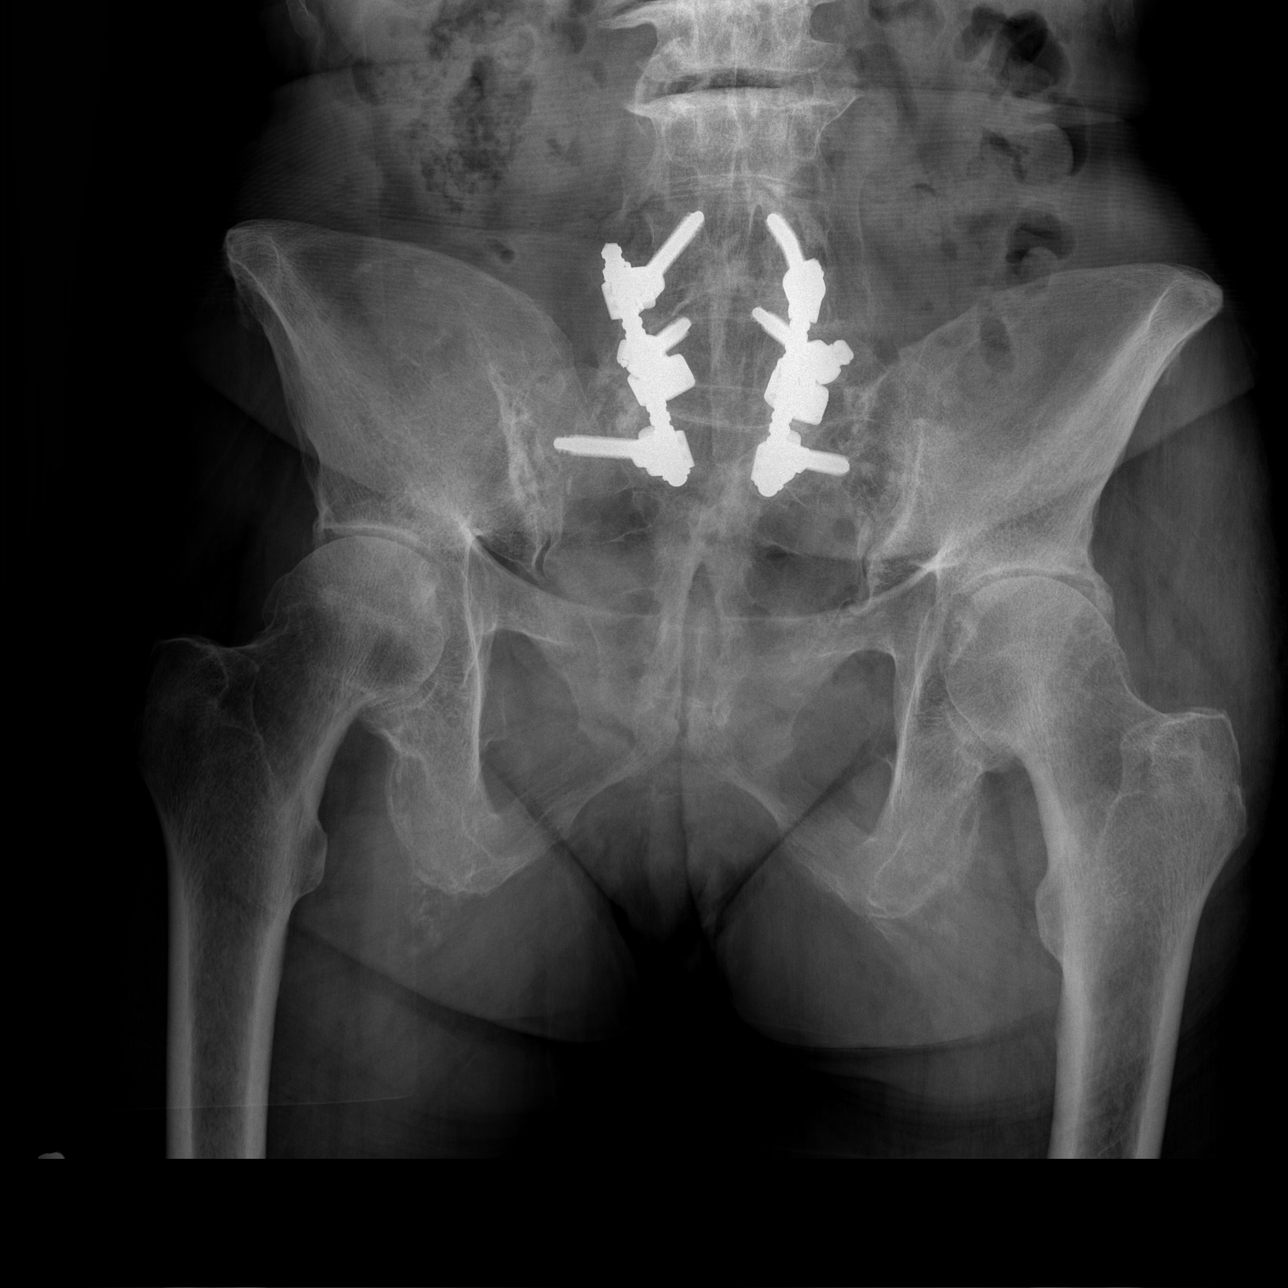

[t hip ap left]
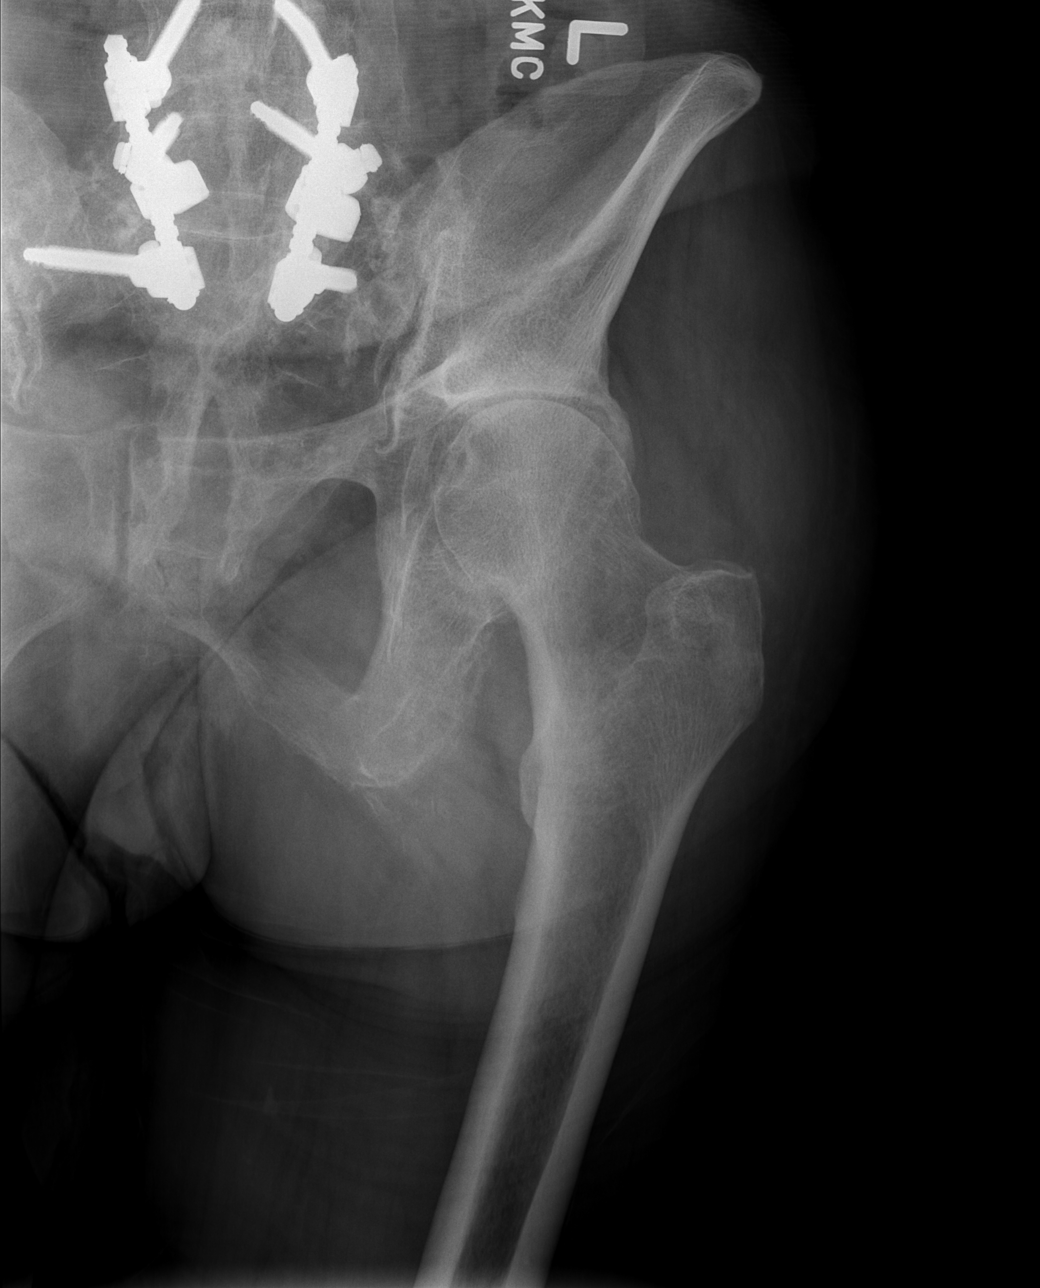

[t hip frog leg left]
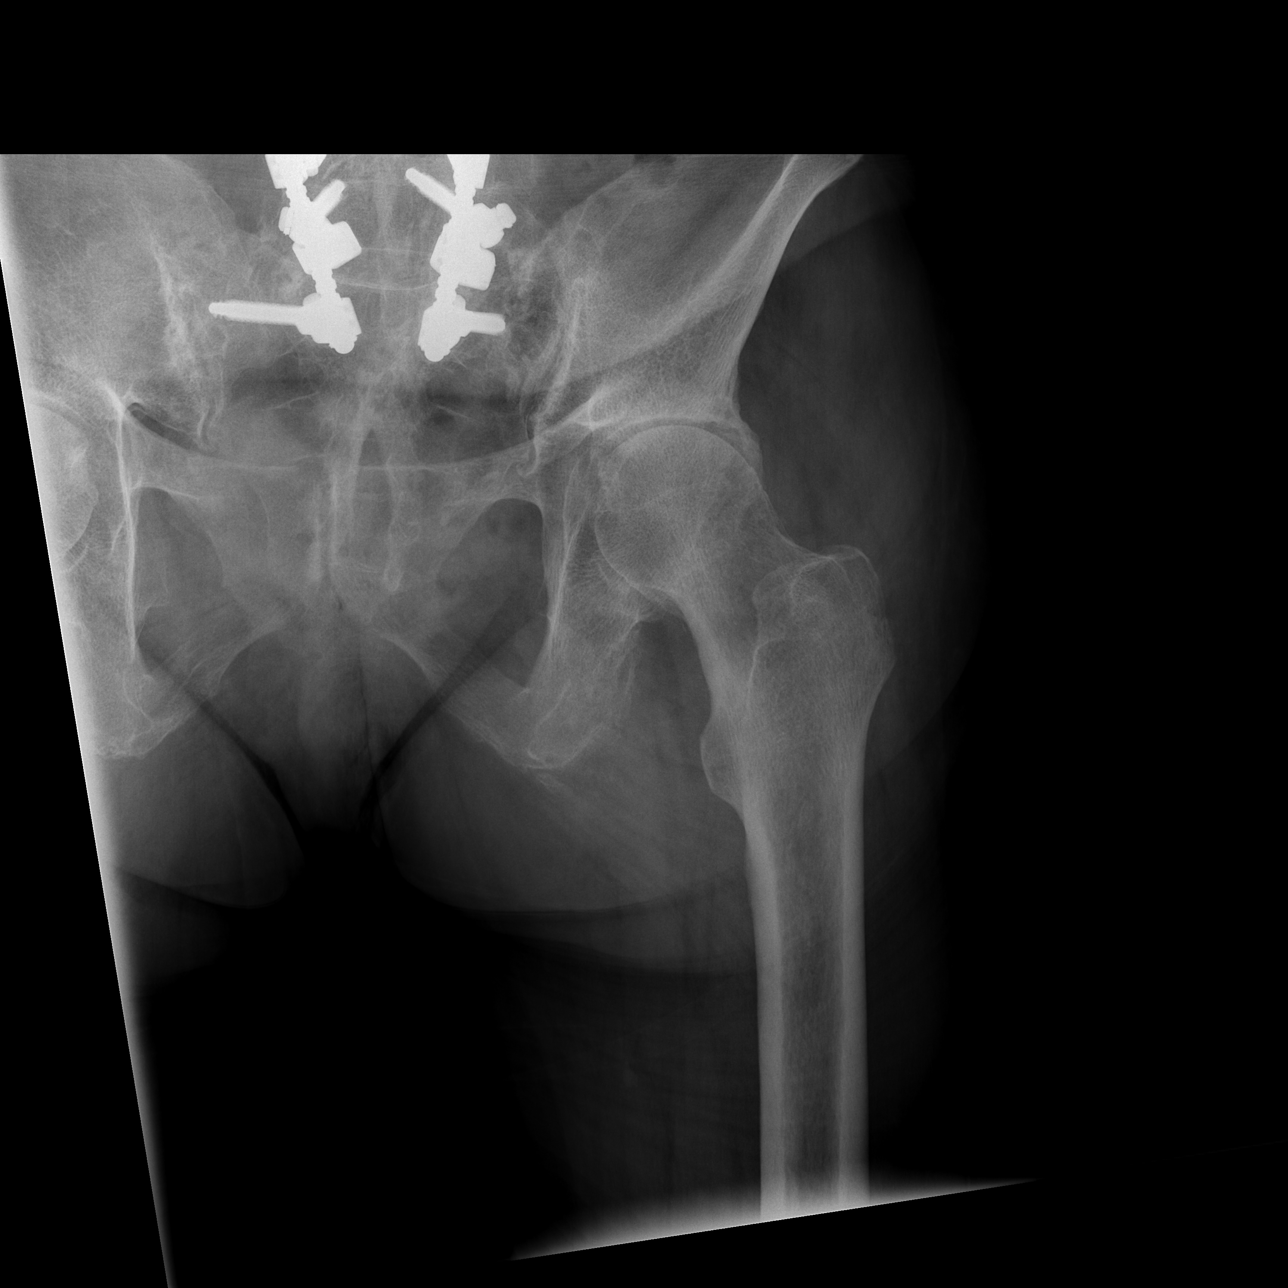

[3 of 3 positions shown; findings below may reference images not displayed]

FINDINGS: AP pelvis with AP and frog-leg lateral views of the left hip. There
are grossly stable postsurgical changes related to previous
lumbosacral fusion. Moderate osteoarthritic changes are present at
both hips. No evidence of acute fracture or dislocation. The bones
appear mildly demineralized. There are degenerative changes within
the spine and sacroiliac joints. Common hamstring enthesophytes are
present bilaterally.
IMPRESSION: No evidence of acute fracture or dislocation. Degenerative changes
as described, similar to prior radiographs.

If the patient has persistent hip pain or inability to bear weight,
follow up imaging may be warranted as acute hip fractures can be
radiographically occult in the elderly.

## 2023-08-02 ENCOUNTER — Other Ambulatory Visit: Payer: Self-pay

## 2023-08-09 DIAGNOSIS — M4802 Spinal stenosis, cervical region: Secondary | ICD-10-CM | POA: Diagnosis not present

## 2023-08-09 DIAGNOSIS — K551 Chronic vascular disorders of intestine: Secondary | ICD-10-CM | POA: Diagnosis not present

## 2023-08-09 DIAGNOSIS — E079 Disorder of thyroid, unspecified: Secondary | ICD-10-CM | POA: Diagnosis not present

## 2023-08-09 DIAGNOSIS — J449 Chronic obstructive pulmonary disease, unspecified: Secondary | ICD-10-CM | POA: Diagnosis not present

## 2023-08-09 DIAGNOSIS — E113299 Type 2 diabetes mellitus with mild nonproliferative diabetic retinopathy without macular edema, unspecified eye: Secondary | ICD-10-CM | POA: Diagnosis not present

## 2023-08-09 DIAGNOSIS — M899 Disorder of bone, unspecified: Secondary | ICD-10-CM | POA: Diagnosis not present

## 2023-08-09 DIAGNOSIS — K219 Gastro-esophageal reflux disease without esophagitis: Secondary | ICD-10-CM | POA: Diagnosis not present

## 2023-08-09 DIAGNOSIS — I700 Atherosclerosis of aorta: Secondary | ICD-10-CM | POA: Diagnosis not present

## 2023-08-09 DIAGNOSIS — I10 Essential (primary) hypertension: Secondary | ICD-10-CM | POA: Diagnosis not present

## 2023-08-09 DIAGNOSIS — E1165 Type 2 diabetes mellitus with hyperglycemia: Secondary | ICD-10-CM | POA: Diagnosis not present

## 2023-08-09 DIAGNOSIS — M961 Postlaminectomy syndrome, not elsewhere classified: Secondary | ICD-10-CM | POA: Diagnosis not present

## 2023-08-09 DIAGNOSIS — E7800 Pure hypercholesterolemia, unspecified: Secondary | ICD-10-CM | POA: Diagnosis not present

## 2023-08-09 DIAGNOSIS — D649 Anemia, unspecified: Secondary | ICD-10-CM | POA: Diagnosis not present

## 2023-08-09 DIAGNOSIS — R1013 Epigastric pain: Secondary | ICD-10-CM | POA: Diagnosis not present

## 2023-08-09 DIAGNOSIS — M949 Disorder of cartilage, unspecified: Secondary | ICD-10-CM | POA: Diagnosis not present

## 2023-08-09 DIAGNOSIS — N8112 Cystocele, lateral: Secondary | ICD-10-CM | POA: Diagnosis not present

## 2023-08-09 DIAGNOSIS — G959 Disease of spinal cord, unspecified: Secondary | ICD-10-CM | POA: Diagnosis not present

## 2023-08-09 DIAGNOSIS — M419 Scoliosis, unspecified: Secondary | ICD-10-CM | POA: Diagnosis not present

## 2023-08-09 DIAGNOSIS — R634 Abnormal weight loss: Secondary | ICD-10-CM | POA: Diagnosis not present

## 2023-08-09 DIAGNOSIS — M4805 Spinal stenosis, thoracolumbar region: Secondary | ICD-10-CM | POA: Diagnosis not present

## 2023-08-09 DIAGNOSIS — M19019 Primary osteoarthritis, unspecified shoulder: Secondary | ICD-10-CM | POA: Diagnosis not present

## 2023-08-09 DIAGNOSIS — M5416 Radiculopathy, lumbar region: Secondary | ICD-10-CM | POA: Diagnosis not present

## 2023-08-09 DIAGNOSIS — Z556 Problems related to health literacy: Secondary | ICD-10-CM | POA: Diagnosis not present

## 2023-08-11 ENCOUNTER — Other Ambulatory Visit: Payer: Self-pay

## 2023-08-11 DIAGNOSIS — M949 Disorder of cartilage, unspecified: Secondary | ICD-10-CM | POA: Diagnosis not present

## 2023-08-11 DIAGNOSIS — M419 Scoliosis, unspecified: Secondary | ICD-10-CM | POA: Diagnosis not present

## 2023-08-11 DIAGNOSIS — R1013 Epigastric pain: Secondary | ICD-10-CM | POA: Diagnosis not present

## 2023-08-11 DIAGNOSIS — N8112 Cystocele, lateral: Secondary | ICD-10-CM | POA: Diagnosis not present

## 2023-08-11 DIAGNOSIS — K551 Chronic vascular disorders of intestine: Secondary | ICD-10-CM | POA: Diagnosis not present

## 2023-08-11 DIAGNOSIS — R634 Abnormal weight loss: Secondary | ICD-10-CM | POA: Diagnosis not present

## 2023-08-11 DIAGNOSIS — M4805 Spinal stenosis, thoracolumbar region: Secondary | ICD-10-CM | POA: Diagnosis not present

## 2023-08-11 DIAGNOSIS — I10 Essential (primary) hypertension: Secondary | ICD-10-CM | POA: Diagnosis not present

## 2023-08-11 DIAGNOSIS — Z556 Problems related to health literacy: Secondary | ICD-10-CM | POA: Diagnosis not present

## 2023-08-11 DIAGNOSIS — D649 Anemia, unspecified: Secondary | ICD-10-CM | POA: Diagnosis not present

## 2023-08-11 DIAGNOSIS — M4802 Spinal stenosis, cervical region: Secondary | ICD-10-CM | POA: Diagnosis not present

## 2023-08-11 DIAGNOSIS — J449 Chronic obstructive pulmonary disease, unspecified: Secondary | ICD-10-CM | POA: Diagnosis not present

## 2023-08-11 DIAGNOSIS — M961 Postlaminectomy syndrome, not elsewhere classified: Secondary | ICD-10-CM | POA: Diagnosis not present

## 2023-08-11 DIAGNOSIS — M899 Disorder of bone, unspecified: Secondary | ICD-10-CM | POA: Diagnosis not present

## 2023-08-11 DIAGNOSIS — I700 Atherosclerosis of aorta: Secondary | ICD-10-CM | POA: Diagnosis not present

## 2023-08-11 DIAGNOSIS — G959 Disease of spinal cord, unspecified: Secondary | ICD-10-CM | POA: Diagnosis not present

## 2023-08-11 DIAGNOSIS — E113299 Type 2 diabetes mellitus with mild nonproliferative diabetic retinopathy without macular edema, unspecified eye: Secondary | ICD-10-CM | POA: Diagnosis not present

## 2023-08-11 DIAGNOSIS — E079 Disorder of thyroid, unspecified: Secondary | ICD-10-CM | POA: Diagnosis not present

## 2023-08-11 DIAGNOSIS — E7800 Pure hypercholesterolemia, unspecified: Secondary | ICD-10-CM | POA: Diagnosis not present

## 2023-08-11 DIAGNOSIS — E1165 Type 2 diabetes mellitus with hyperglycemia: Secondary | ICD-10-CM | POA: Diagnosis not present

## 2023-08-11 DIAGNOSIS — M19019 Primary osteoarthritis, unspecified shoulder: Secondary | ICD-10-CM | POA: Diagnosis not present

## 2023-08-11 DIAGNOSIS — K219 Gastro-esophageal reflux disease without esophagitis: Secondary | ICD-10-CM | POA: Diagnosis not present

## 2023-08-11 DIAGNOSIS — M5416 Radiculopathy, lumbar region: Secondary | ICD-10-CM | POA: Diagnosis not present

## 2023-08-15 ENCOUNTER — Other Ambulatory Visit: Payer: Self-pay

## 2023-08-15 DIAGNOSIS — E079 Disorder of thyroid, unspecified: Secondary | ICD-10-CM | POA: Diagnosis not present

## 2023-08-15 DIAGNOSIS — E113299 Type 2 diabetes mellitus with mild nonproliferative diabetic retinopathy without macular edema, unspecified eye: Secondary | ICD-10-CM | POA: Diagnosis not present

## 2023-08-15 DIAGNOSIS — R1013 Epigastric pain: Secondary | ICD-10-CM | POA: Diagnosis not present

## 2023-08-15 DIAGNOSIS — M19019 Primary osteoarthritis, unspecified shoulder: Secondary | ICD-10-CM | POA: Diagnosis not present

## 2023-08-15 DIAGNOSIS — M4802 Spinal stenosis, cervical region: Secondary | ICD-10-CM | POA: Diagnosis not present

## 2023-08-15 DIAGNOSIS — I10 Essential (primary) hypertension: Secondary | ICD-10-CM | POA: Diagnosis not present

## 2023-08-15 DIAGNOSIS — M899 Disorder of bone, unspecified: Secondary | ICD-10-CM | POA: Diagnosis not present

## 2023-08-15 DIAGNOSIS — M419 Scoliosis, unspecified: Secondary | ICD-10-CM | POA: Diagnosis not present

## 2023-08-15 DIAGNOSIS — M4805 Spinal stenosis, thoracolumbar region: Secondary | ICD-10-CM | POA: Diagnosis not present

## 2023-08-15 DIAGNOSIS — I700 Atherosclerosis of aorta: Secondary | ICD-10-CM | POA: Diagnosis not present

## 2023-08-15 DIAGNOSIS — J449 Chronic obstructive pulmonary disease, unspecified: Secondary | ICD-10-CM | POA: Diagnosis not present

## 2023-08-15 DIAGNOSIS — G959 Disease of spinal cord, unspecified: Secondary | ICD-10-CM | POA: Diagnosis not present

## 2023-08-15 DIAGNOSIS — K551 Chronic vascular disorders of intestine: Secondary | ICD-10-CM | POA: Diagnosis not present

## 2023-08-15 DIAGNOSIS — E7800 Pure hypercholesterolemia, unspecified: Secondary | ICD-10-CM | POA: Diagnosis not present

## 2023-08-15 DIAGNOSIS — K219 Gastro-esophageal reflux disease without esophagitis: Secondary | ICD-10-CM | POA: Diagnosis not present

## 2023-08-15 DIAGNOSIS — M949 Disorder of cartilage, unspecified: Secondary | ICD-10-CM | POA: Diagnosis not present

## 2023-08-15 DIAGNOSIS — Z556 Problems related to health literacy: Secondary | ICD-10-CM | POA: Diagnosis not present

## 2023-08-15 DIAGNOSIS — R634 Abnormal weight loss: Secondary | ICD-10-CM | POA: Diagnosis not present

## 2023-08-15 DIAGNOSIS — M961 Postlaminectomy syndrome, not elsewhere classified: Secondary | ICD-10-CM | POA: Diagnosis not present

## 2023-08-15 DIAGNOSIS — M5416 Radiculopathy, lumbar region: Secondary | ICD-10-CM | POA: Diagnosis not present

## 2023-08-15 DIAGNOSIS — D649 Anemia, unspecified: Secondary | ICD-10-CM | POA: Diagnosis not present

## 2023-08-15 DIAGNOSIS — N8112 Cystocele, lateral: Secondary | ICD-10-CM | POA: Diagnosis not present

## 2023-08-15 DIAGNOSIS — E1165 Type 2 diabetes mellitus with hyperglycemia: Secondary | ICD-10-CM | POA: Diagnosis not present

## 2023-08-18 DIAGNOSIS — M4802 Spinal stenosis, cervical region: Secondary | ICD-10-CM | POA: Diagnosis not present

## 2023-08-18 DIAGNOSIS — J449 Chronic obstructive pulmonary disease, unspecified: Secondary | ICD-10-CM | POA: Diagnosis not present

## 2023-08-18 DIAGNOSIS — R1013 Epigastric pain: Secondary | ICD-10-CM | POA: Diagnosis not present

## 2023-08-18 DIAGNOSIS — D649 Anemia, unspecified: Secondary | ICD-10-CM | POA: Diagnosis not present

## 2023-08-18 DIAGNOSIS — G959 Disease of spinal cord, unspecified: Secondary | ICD-10-CM | POA: Diagnosis not present

## 2023-08-18 DIAGNOSIS — M4805 Spinal stenosis, thoracolumbar region: Secondary | ICD-10-CM | POA: Diagnosis not present

## 2023-08-18 DIAGNOSIS — R634 Abnormal weight loss: Secondary | ICD-10-CM | POA: Diagnosis not present

## 2023-08-18 DIAGNOSIS — M899 Disorder of bone, unspecified: Secondary | ICD-10-CM | POA: Diagnosis not present

## 2023-08-18 DIAGNOSIS — I700 Atherosclerosis of aorta: Secondary | ICD-10-CM | POA: Diagnosis not present

## 2023-08-18 DIAGNOSIS — K551 Chronic vascular disorders of intestine: Secondary | ICD-10-CM | POA: Diagnosis not present

## 2023-08-18 DIAGNOSIS — K219 Gastro-esophageal reflux disease without esophagitis: Secondary | ICD-10-CM | POA: Diagnosis not present

## 2023-08-18 DIAGNOSIS — N8112 Cystocele, lateral: Secondary | ICD-10-CM | POA: Diagnosis not present

## 2023-08-18 DIAGNOSIS — M949 Disorder of cartilage, unspecified: Secondary | ICD-10-CM | POA: Diagnosis not present

## 2023-08-18 DIAGNOSIS — E7800 Pure hypercholesterolemia, unspecified: Secondary | ICD-10-CM | POA: Diagnosis not present

## 2023-08-18 DIAGNOSIS — E1165 Type 2 diabetes mellitus with hyperglycemia: Secondary | ICD-10-CM | POA: Diagnosis not present

## 2023-08-18 DIAGNOSIS — E113299 Type 2 diabetes mellitus with mild nonproliferative diabetic retinopathy without macular edema, unspecified eye: Secondary | ICD-10-CM | POA: Diagnosis not present

## 2023-08-18 DIAGNOSIS — M419 Scoliosis, unspecified: Secondary | ICD-10-CM | POA: Diagnosis not present

## 2023-08-18 DIAGNOSIS — M5416 Radiculopathy, lumbar region: Secondary | ICD-10-CM | POA: Diagnosis not present

## 2023-08-18 DIAGNOSIS — M961 Postlaminectomy syndrome, not elsewhere classified: Secondary | ICD-10-CM | POA: Diagnosis not present

## 2023-08-18 DIAGNOSIS — E079 Disorder of thyroid, unspecified: Secondary | ICD-10-CM | POA: Diagnosis not present

## 2023-08-18 DIAGNOSIS — I10 Essential (primary) hypertension: Secondary | ICD-10-CM | POA: Diagnosis not present

## 2023-08-18 DIAGNOSIS — M19019 Primary osteoarthritis, unspecified shoulder: Secondary | ICD-10-CM | POA: Diagnosis not present

## 2023-08-18 DIAGNOSIS — Z556 Problems related to health literacy: Secondary | ICD-10-CM | POA: Diagnosis not present

## 2023-08-22 ENCOUNTER — Other Ambulatory Visit (HOSPITAL_COMMUNITY): Payer: Self-pay

## 2023-08-23 DIAGNOSIS — G959 Disease of spinal cord, unspecified: Secondary | ICD-10-CM | POA: Diagnosis not present

## 2023-08-23 DIAGNOSIS — K219 Gastro-esophageal reflux disease without esophagitis: Secondary | ICD-10-CM | POA: Diagnosis not present

## 2023-08-23 DIAGNOSIS — D649 Anemia, unspecified: Secondary | ICD-10-CM | POA: Diagnosis not present

## 2023-08-23 DIAGNOSIS — E7800 Pure hypercholesterolemia, unspecified: Secondary | ICD-10-CM | POA: Diagnosis not present

## 2023-08-23 DIAGNOSIS — M4805 Spinal stenosis, thoracolumbar region: Secondary | ICD-10-CM | POA: Diagnosis not present

## 2023-08-23 DIAGNOSIS — E113299 Type 2 diabetes mellitus with mild nonproliferative diabetic retinopathy without macular edema, unspecified eye: Secondary | ICD-10-CM | POA: Diagnosis not present

## 2023-08-23 DIAGNOSIS — E1165 Type 2 diabetes mellitus with hyperglycemia: Secondary | ICD-10-CM | POA: Diagnosis not present

## 2023-08-23 DIAGNOSIS — R1013 Epigastric pain: Secondary | ICD-10-CM | POA: Diagnosis not present

## 2023-08-23 DIAGNOSIS — I10 Essential (primary) hypertension: Secondary | ICD-10-CM | POA: Diagnosis not present

## 2023-08-23 DIAGNOSIS — R634 Abnormal weight loss: Secondary | ICD-10-CM | POA: Diagnosis not present

## 2023-08-23 DIAGNOSIS — M419 Scoliosis, unspecified: Secondary | ICD-10-CM | POA: Diagnosis not present

## 2023-08-23 DIAGNOSIS — M961 Postlaminectomy syndrome, not elsewhere classified: Secondary | ICD-10-CM | POA: Diagnosis not present

## 2023-08-23 DIAGNOSIS — Z556 Problems related to health literacy: Secondary | ICD-10-CM | POA: Diagnosis not present

## 2023-08-23 DIAGNOSIS — E079 Disorder of thyroid, unspecified: Secondary | ICD-10-CM | POA: Diagnosis not present

## 2023-08-23 DIAGNOSIS — M19019 Primary osteoarthritis, unspecified shoulder: Secondary | ICD-10-CM | POA: Diagnosis not present

## 2023-08-23 DIAGNOSIS — M5416 Radiculopathy, lumbar region: Secondary | ICD-10-CM | POA: Diagnosis not present

## 2023-08-23 DIAGNOSIS — M899 Disorder of bone, unspecified: Secondary | ICD-10-CM | POA: Diagnosis not present

## 2023-08-23 DIAGNOSIS — K551 Chronic vascular disorders of intestine: Secondary | ICD-10-CM | POA: Diagnosis not present

## 2023-08-23 DIAGNOSIS — M4802 Spinal stenosis, cervical region: Secondary | ICD-10-CM | POA: Diagnosis not present

## 2023-08-23 DIAGNOSIS — J449 Chronic obstructive pulmonary disease, unspecified: Secondary | ICD-10-CM | POA: Diagnosis not present

## 2023-08-23 DIAGNOSIS — N8112 Cystocele, lateral: Secondary | ICD-10-CM | POA: Diagnosis not present

## 2023-08-23 DIAGNOSIS — M949 Disorder of cartilage, unspecified: Secondary | ICD-10-CM | POA: Diagnosis not present

## 2023-08-23 DIAGNOSIS — I700 Atherosclerosis of aorta: Secondary | ICD-10-CM | POA: Diagnosis not present

## 2023-08-24 DIAGNOSIS — E113299 Type 2 diabetes mellitus with mild nonproliferative diabetic retinopathy without macular edema, unspecified eye: Secondary | ICD-10-CM | POA: Diagnosis not present

## 2023-08-24 DIAGNOSIS — Z556 Problems related to health literacy: Secondary | ICD-10-CM | POA: Diagnosis not present

## 2023-08-24 DIAGNOSIS — M899 Disorder of bone, unspecified: Secondary | ICD-10-CM | POA: Diagnosis not present

## 2023-08-24 DIAGNOSIS — R634 Abnormal weight loss: Secondary | ICD-10-CM | POA: Diagnosis not present

## 2023-08-24 DIAGNOSIS — G959 Disease of spinal cord, unspecified: Secondary | ICD-10-CM | POA: Diagnosis not present

## 2023-08-24 DIAGNOSIS — E7800 Pure hypercholesterolemia, unspecified: Secondary | ICD-10-CM | POA: Diagnosis not present

## 2023-08-24 DIAGNOSIS — M4805 Spinal stenosis, thoracolumbar region: Secondary | ICD-10-CM | POA: Diagnosis not present

## 2023-08-24 DIAGNOSIS — M19019 Primary osteoarthritis, unspecified shoulder: Secondary | ICD-10-CM | POA: Diagnosis not present

## 2023-08-24 DIAGNOSIS — D649 Anemia, unspecified: Secondary | ICD-10-CM | POA: Diagnosis not present

## 2023-08-24 DIAGNOSIS — I700 Atherosclerosis of aorta: Secondary | ICD-10-CM | POA: Diagnosis not present

## 2023-08-24 DIAGNOSIS — K551 Chronic vascular disorders of intestine: Secondary | ICD-10-CM | POA: Diagnosis not present

## 2023-08-24 DIAGNOSIS — M4802 Spinal stenosis, cervical region: Secondary | ICD-10-CM | POA: Diagnosis not present

## 2023-08-24 DIAGNOSIS — M419 Scoliosis, unspecified: Secondary | ICD-10-CM | POA: Diagnosis not present

## 2023-08-24 DIAGNOSIS — M5416 Radiculopathy, lumbar region: Secondary | ICD-10-CM | POA: Diagnosis not present

## 2023-08-24 DIAGNOSIS — M961 Postlaminectomy syndrome, not elsewhere classified: Secondary | ICD-10-CM | POA: Diagnosis not present

## 2023-08-24 DIAGNOSIS — I10 Essential (primary) hypertension: Secondary | ICD-10-CM | POA: Diagnosis not present

## 2023-08-24 DIAGNOSIS — E1165 Type 2 diabetes mellitus with hyperglycemia: Secondary | ICD-10-CM | POA: Diagnosis not present

## 2023-08-24 DIAGNOSIS — K219 Gastro-esophageal reflux disease without esophagitis: Secondary | ICD-10-CM | POA: Diagnosis not present

## 2023-08-24 DIAGNOSIS — E079 Disorder of thyroid, unspecified: Secondary | ICD-10-CM | POA: Diagnosis not present

## 2023-08-24 DIAGNOSIS — R1013 Epigastric pain: Secondary | ICD-10-CM | POA: Diagnosis not present

## 2023-08-24 DIAGNOSIS — M949 Disorder of cartilage, unspecified: Secondary | ICD-10-CM | POA: Diagnosis not present

## 2023-08-24 DIAGNOSIS — J449 Chronic obstructive pulmonary disease, unspecified: Secondary | ICD-10-CM | POA: Diagnosis not present

## 2023-08-24 DIAGNOSIS — N8112 Cystocele, lateral: Secondary | ICD-10-CM | POA: Diagnosis not present

## 2023-09-07 ENCOUNTER — Other Ambulatory Visit (HOSPITAL_COMMUNITY): Payer: Self-pay

## 2023-09-07 ENCOUNTER — Other Ambulatory Visit: Payer: Self-pay

## 2023-09-08 ENCOUNTER — Other Ambulatory Visit: Payer: Self-pay

## 2023-09-22 ENCOUNTER — Inpatient Hospital Stay: Admitting: Internal Medicine

## 2023-09-26 ENCOUNTER — Other Ambulatory Visit: Payer: Self-pay

## 2023-09-27 ENCOUNTER — Encounter: Admitting: Internal Medicine

## 2023-09-29 ENCOUNTER — Ambulatory Visit (HOSPITAL_COMMUNITY)
Admission: RE | Admit: 2023-09-29 | Discharge: 2023-09-29 | Disposition: A | Discharge status: Home / Self Care | Source: Ambulatory Visit | Attending: Internal Medicine | Admitting: Internal Medicine

## 2023-09-29 ENCOUNTER — Other Ambulatory Visit: Payer: Self-pay

## 2023-09-29 ENCOUNTER — Other Ambulatory Visit (HOSPITAL_COMMUNITY): Payer: Self-pay

## 2023-09-29 ENCOUNTER — Ambulatory Visit (INDEPENDENT_AMBULATORY_CARE_PROVIDER_SITE_OTHER): Admitting: Internal Medicine

## 2023-09-29 VITALS — BP 175/84 | HR 81 | Temp 98.6°F | Ht 65.0 in | Wt 133.1 lb

## 2023-09-29 DIAGNOSIS — G8929 Other chronic pain: Secondary | ICD-10-CM | POA: Diagnosis not present

## 2023-09-29 DIAGNOSIS — I152 Hypertension secondary to endocrine disorders: Secondary | ICD-10-CM | POA: Diagnosis not present

## 2023-09-29 DIAGNOSIS — M7989 Other specified soft tissue disorders: Secondary | ICD-10-CM | POA: Diagnosis not present

## 2023-09-29 DIAGNOSIS — M25552 Pain in left hip: Secondary | ICD-10-CM | POA: Insufficient documentation

## 2023-09-29 DIAGNOSIS — E1159 Type 2 diabetes mellitus with other circulatory complications: Secondary | ICD-10-CM | POA: Diagnosis not present

## 2023-09-29 DIAGNOSIS — M1612 Unilateral primary osteoarthritis, left hip: Secondary | ICD-10-CM | POA: Diagnosis not present

## 2023-09-29 DIAGNOSIS — Z9181 History of falling: Secondary | ICD-10-CM | POA: Diagnosis not present

## 2023-09-29 DIAGNOSIS — E119 Type 2 diabetes mellitus without complications: Secondary | ICD-10-CM

## 2023-09-29 DIAGNOSIS — E441 Mild protein-calorie malnutrition: Secondary | ICD-10-CM

## 2023-09-29 DIAGNOSIS — Z981 Arthrodesis status: Secondary | ICD-10-CM | POA: Diagnosis not present

## 2023-09-29 LAB — GLUCOSE, CAPILLARY: Glucose-Capillary: 84 mg/dL (ref 70–99)

## 2023-09-29 LAB — POCT GLYCOSYLATED HEMOGLOBIN (HGB A1C): Hemoglobin A1C: 5.7 % — AB (ref 4.0–5.6)

## 2023-09-29 MED ORDER — DICLOFENAC SODIUM 1 % EX GEL
4.0000 g | Freq: Four times a day (QID) | CUTANEOUS | 8 refills | Status: AC | PRN
  Filled 2023-09-29: qty 200, 12d supply, fill #0
  Filled 2023-10-25: qty 200, 12d supply, fill #1

## 2023-09-29 NOTE — Patient Instructions (Signed)
 Sydney Gilbert,  You look great today!  I'm sorry you don't feel as well as you look, and understand this hip is really giving you problems.  Let's get some topical voltaren  to rub on it, up to 4 times a day.  You can also apply to the ankle.  Xray will be scheduled.  Checking some bloodwork today.  I'll be in touch with next steps, including a new blood pressure medicine.  Please throw away all medications that are past the prescription date.  Only take those for the specific calendar date.  See you very soon!    Be careful,  Dr. Broadus Canes

## 2023-09-30 ENCOUNTER — Encounter: Payer: Self-pay | Admitting: Internal Medicine

## 2023-09-30 LAB — BMP8+ANION GAP
Anion Gap: 13 mmol/L (ref 10.0–18.0)
BUN/Creatinine Ratio: 17 (ref 12–28)
BUN: 14 mg/dL (ref 8–27)
CO2: 24 mmol/L (ref 20–29)
Calcium: 9.6 mg/dL (ref 8.7–10.3)
Chloride: 107 mmol/L — ABNORMAL HIGH (ref 96–106)
Creatinine, Ser: 0.84 mg/dL (ref 0.57–1.00)
Glucose: 89 mg/dL (ref 70–99)
Potassium: 4.1 mmol/L (ref 3.5–5.2)
Sodium: 144 mmol/L (ref 134–144)
eGFR: 69 mL/min/{1.73_m2} (ref >59)

## 2023-10-04 ENCOUNTER — Other Ambulatory Visit (HOSPITAL_COMMUNITY): Payer: Self-pay

## 2023-10-04 ENCOUNTER — Other Ambulatory Visit: Payer: Self-pay

## 2023-10-05 ENCOUNTER — Other Ambulatory Visit: Payer: Self-pay

## 2023-10-07 ENCOUNTER — Ambulatory Visit: Payer: Self-pay | Admitting: *Deleted

## 2023-10-07 NOTE — Telephone Encounter (Signed)
 Copied from CRM [id number]. Topic: Clinical - Red Word Triage >> Oct 07, 2023  1:42 PM Brittney F wrote: Reason for CRM:   Patient's daughter and the patient called in stating the patient was concerned about applying the medication where originally instructed due to label instructing patient not to apply the product to the area of concern.   Medication in Question: diclofenac  Sodium (VOLTAREN ) 1 % GEL  Patient's daughter would like a callback with instruction and she will connect her mother to the call for her to hear.  Callback Number: [phone] >> Oct 07, 2023  1:47 PM Danelle Dunning F wrote: CAL is closed; Patient is currently experiencing a great deal of pain in her foot which is the reason for the warm transfer Reason for Disposition  [1] Caller has URGENT medicine question about med that PCP or specialist prescribed AND [2] triager unable to answer question  Answer Assessment - Initial Assessment Questions 1. NAME of MEDICINE: "What medicine(s) are you calling about?"     Voltaren  1% gel   Dina Francisco, daughter on line with pt.  2. QUESTION: "What is your question?" (e.g., double dose of medicine, side effect)     The instructions from dr say Use topical Voltaren  on hip and ankle.   But instructions on tube says not to use the Voltaren  on hip.   The hip is the new area she is to use it.    She's been using it on her ankle which is one of the places the directions say it's ok to use it.     I suggested they call their pharmacist since the practice closed today at 12:00.  They were agreeable to that.   I asked if she had an alternative pain medication she could use.   She is using Tylenol  and propping her hip up.   I also suggested ice or heat, whichever felt best too.   They thanked me for this suggestion.  3. PRESCRIBER: "Who prescribed the medicine?" Reason: if prescribed by specialist, call should be referred to that group.     Dr. Barkley Boot. 4. SYMPTOMS: "Do you have any symptoms?" If Yes,  ask: "What symptoms are you having?"  "How bad are the symptoms (e.g., mild, moderate, severe)     Hip and ankle pain 5. PREGNANCY:  "Is there any chance that you are pregnant?" "When was your last menstrual period?"     N/A due to age  Protocols used: Medication Question Call-A-AH  Chief Complaint: Had a question regarding using the Voltaren  1% gel on her hip.   Doctor said to use it on hip and ankle.   The instructions with the jel say to only use it certain places but the hip not being of them.   Questioning if she should use the gel on her hip or not.     Practice closed at 12:00 today.   Suggested they call their pharmacist which they are agreeable to doing.   I'm also sending a message to the practice for Monday morning for clarification. Symptoms: use of Voltaren  for hip pain   1% jel Frequency: N/A Pertinent Negatives: Patient denies N/A Disposition: [] ED /[] Urgent Care (no appt availability in office) / [] Appointment(In office/virtual)/ []  [facility]/ [] Home Care/ [] Refused Recommended Disposition /[]  Mobile Bus/ [x]  Follow-up with PCP Additional Notes: Message sent to the practice for Monday morning.   Pt is going to call the pharmacist in the mean time.

## 2023-10-10 ENCOUNTER — Other Ambulatory Visit: Payer: Self-pay

## 2023-10-10 NOTE — Telephone Encounter (Signed)
 I called pt - informed it's ok to use Voltaren  gel on her hip per Dr Broadus Canes. Stated she understands.

## 2023-10-12 DIAGNOSIS — G8929 Other chronic pain: Secondary | ICD-10-CM | POA: Insufficient documentation

## 2023-10-12 DIAGNOSIS — Z9181 History of falling: Secondary | ICD-10-CM | POA: Insufficient documentation

## 2023-10-12 NOTE — Assessment & Plan Note (Signed)
 A1c today 5.7 without treatment. Diet has improved to include more protein.  Did not complete UAC today.  She had normal eGFR.  Life expectancy was not felt to be sufficient to monitor UAC given her significant frailty.

## 2023-10-12 NOTE — Assessment & Plan Note (Signed)
 20# weight gain compared to nadir 6 M ago, desirable and intentional, with mirtazapine  and increased caloric intake.  She physically looks much better, with her face and soft tissues filling out.  Continue same plan with goal of preventing adverse consequences of frailty.

## 2023-10-12 NOTE — Assessment & Plan Note (Signed)
 OA suspected. She may apply diclofenac  gel. Xray.

## 2023-10-12 NOTE — Progress Notes (Signed)
 83 y.o. Sydney Gilbert is here for routine follow-up of chronic severe back pain with mobility limitation, unintended weight loss due to calorie insufficiency, frailty syndrome, HTN, among other problems listed below.  Since last visit patient has seen GI Dr. Cherryl Gilbert who was pleased with the resolution of her abdominal symptoms and with her appropriate weight gain.   Feels generally poorly due to chronic pain of the back and more recently her L hip. No falls, but she moves little and is very cautious.   Has been receiving medications delivered with adherence packaging but has been confused and hasn't been taking them on the appropriate dates.   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   Constipation, chronic 03/04/2023   Chronic lower back pain 03/04/2023   Weight loss, abnormal 02/24/2023   Frailty syndrome in geriatric patient 02/24/2023   Cognitive impairment 01/14/2023   Aortic regurgitation 01/14/2023   Dyspepsia 01/14/2023   Protein calorie malnutrition (HCC) 01/14/2023   Impaired functional mobility, balance, and endurance 01/14/2023   Depression    Type 2 diabetes, diet controlled (HCC) 09/03/2019   Hypertension associated with diabetes (HCC) 09/03/2019   HLD (hyperlipidemia) 09/03/2019   Acquired kyphoscoliosis 06/24/2017   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   Current Outpatient Medications:    diclofenac  Sodium (VOLTAREN ) 1 % GEL, Apply 4 g topically 4 (four) times daily as needed., Disp: 150 g, Rfl: 8   acetaminophen  (TYLENOL  8 HOUR) 650 MG CR tablet, Take 2 tablets (1,300 mg total) by mouth 2 (two) times daily., Disp: 120 tablet, Rfl: 11   amLODipine  (NORVASC ) 10 MG tablet, Take 1 tablet (10 mg total) by mouth daily., Disp: 90 tablet, Rfl: 3   cholecalciferol  (VITAMIN D3) 25 MCG (1000 UNIT) tablet, Take 1 tablet (1,000 Units total) by mouth daily., Disp: 90 tablet, Rfl: 3   gabapentin  (NEURONTIN ) 300  MG capsule, Take 1 capsule (300 mg total) by mouth at bedtime., Disp: 30 capsule, Rfl: 11   lidocaine  (LIDODERM ) 5 %, Place 1 patch onto the skin every 12 (twelve) hours. Remove & Discard patch within 12 hours or as directed by MD, Disp: 10 patch, Rfl: 2   lubiprostone  (AMITIZA ) 8 MCG capsule, Take 1 capsule (8 mcg total) by mouth daily with breakfast., Disp: 60 capsule, Rfl: 11   mirtazapine  (REMERON ) 15 MG tablet, Take 1 tablet (15 mg total) by mouth at bedtime., Disp: 30 tablet, Rfl: 11   omeprazole  (PRILOSEC ) 20 MG capsule, Take 1 capsule (20 mg total) by mouth daily., Disp: 30 capsule, Rfl: 11   triamcinolone  cream (KENALOG ) 0.1 %, Apply 1 Application topically 2 (two) times daily. For itchy rash, Disp: 30 g, Rfl: 1  Functional Status:  Impaired physical function and is assisted with IADLs as well.  Lives with an older sister who is experiencing dementia, though this sister does most of the cooking.  No local family but her children are very involved and usually make the trips to [facility] for medical appts and to help stock groceries and help with shopping (son Sydney Gilbert in [place], son Sydney Gilbert in [place], [name].  Walks with rollator. Was formerly a participant at [employer]; declined reenrollement. Cancelled MOW as she was unable to eat the meals.    Objective BP (!) 175/84 (BP Location: Left Arm, Patient Position: Sitting, Cuff Size: Small)   Pulse 81   Temp 98.6 F (37 C) (Oral)   Ht 5\' 5"  (1.651 m)  Wt 133 lb 1.6 oz (60.4 kg)   SpO2 100%   BMI 22.15 kg/m   Exam: Fixed forward flexed position chronic. Nicely dressed and groomed, awake and alert, always fun to visit with. Skin turgor normal. Mild LE edema L>R.  L knee not tender or inflamed.  L hip with painful internal and external rotation.  Not tender over trochanter.  Problems addressed today:  Protein calorie malnutrition (HCC) 20# weight gain compared to nadir 6 M ago, desirable and intentional, with mirtazapine  and  increased caloric intake.  She physically looks much better, with her face and soft tissues filling out.  Continue same plan with goal of preventing adverse consequences of frailty.  Hypertension associated with diabetes (HCC) 175/84 today; thinks she took her medicines but may not have been her morning meds (adherence packaging, tiny print, she's been confused).  Previously controlled on this regimen.  Before making med changes will see if we can't work on taking meds as prescribed.  Precarious situation.  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 She may apply diclofenac  gel. Xray.   Type 2 diabetes, diet controlled (HCC) A1c today 5.7 without treatment. Diet has improved to include more protein.  Did not complete UAC today.  She had normal eGFR.  Life expectancy was not felt to be sufficient to monitor UAC given her significant frailty.     Return in about 3 months (around 12/29/2023) for symptom re-check, chronic condition monitoring, functional reassessment.

## 2023-10-12 NOTE — Assessment & Plan Note (Signed)
 175/84 today; thinks she took her medicines but may not have been her morning meds (adherence packaging, tiny print, she's been confused).  Previously controlled on this regimen.  Before making med changes will see if we can't work on taking meds as prescribed.  Precarious situation.

## 2023-10-21 ENCOUNTER — Other Ambulatory Visit: Payer: Self-pay

## 2023-10-21 ENCOUNTER — Other Ambulatory Visit (HOSPITAL_COMMUNITY): Payer: Self-pay

## 2023-10-24 ENCOUNTER — Other Ambulatory Visit (HOSPITAL_COMMUNITY): Payer: Self-pay

## 2023-10-25 ENCOUNTER — Other Ambulatory Visit (HOSPITAL_COMMUNITY): Payer: Self-pay

## 2023-10-25 ENCOUNTER — Other Ambulatory Visit: Payer: Self-pay

## 2023-10-27 ENCOUNTER — Inpatient Hospital Stay: Admitting: Internal Medicine

## 2023-10-27 ENCOUNTER — Other Ambulatory Visit: Payer: Self-pay

## 2023-10-28 ENCOUNTER — Telehealth: Payer: Self-pay | Admitting: *Deleted

## 2023-10-28 NOTE — Telephone Encounter (Signed)
 Copied from CRM [id number]. Topic: General - Other >> Oct 28, 2023 10:22 AM Sydney Gilbert wrote: Reason for CRM: Patients POA Sydney Gilbert called and has some paperwork that she needs Dr. Broadus Canes to fill out and once Dr. Broadus Canes completes her part she is asking could the papers be sent via fax to the facility so the patient can go to adult daycare. Fax number for the facility will be included in the paperwork that the patient sends. Patients POA can be called back at [phone].

## 2023-10-28 NOTE — Telephone Encounter (Signed)
 Patient called back and I gave her the fax number [phone]

## 2023-10-28 NOTE — Telephone Encounter (Signed)
 I called pt's daughter, Bartholomew Light, who stated she lives in [place] and wanted to know if she could fax our office paperwork. Stated she will call back for the office's fax number.

## 2023-11-16 ENCOUNTER — Other Ambulatory Visit: Payer: Self-pay

## 2023-12-08 ENCOUNTER — Other Ambulatory Visit: Payer: Self-pay

## 2023-12-12 ENCOUNTER — Other Ambulatory Visit: Payer: Self-pay

## 2023-12-12 ENCOUNTER — Telehealth: Payer: Self-pay | Admitting: Internal Medicine

## 2023-12-12 NOTE — Telephone Encounter (Signed)
 Paperwork was just received today.  Address listed on envelope was incorrect. It had [address] instead of [address], also [address] was also missing.  Form has been placed in Dr. Trudy' box for completion and she was also sent a message to let her know. Will call patient when form has been completed.    Copied from CRM [id number]. Topic: General - Other >> Dec 12, 2023  3:19 PM Fredrica W wrote: Reason for CRM: Patient daughter Channing called. States she mailed in paperwork for provider to complete about 2-3 weeks ago. Would like a call back to know if its been received/completed. Thank You

## 2023-12-13 ENCOUNTER — Other Ambulatory Visit: Payer: Self-pay

## 2023-12-13 ENCOUNTER — Telehealth: Payer: Self-pay

## 2023-12-13 NOTE — Telephone Encounter (Signed)
 I contacted pt's daughter Kandra Graven to let her know that  [employer] application and [facility] forms has been completed and faxed. Per pt's daughter request, she would like both forms to be mailed to her mother home address. Advised her I will mailed both forms, and to call back if she need further assistance.

## 2023-12-26 ENCOUNTER — Other Ambulatory Visit: Payer: Self-pay

## 2023-12-29 ENCOUNTER — Other Ambulatory Visit: Payer: Self-pay

## 2024-01-05 ENCOUNTER — Other Ambulatory Visit: Payer: Self-pay

## 2024-01-05 ENCOUNTER — Other Ambulatory Visit (HOSPITAL_BASED_OUTPATIENT_CLINIC_OR_DEPARTMENT_OTHER): Payer: Self-pay

## 2024-01-06 ENCOUNTER — Other Ambulatory Visit: Payer: Self-pay

## 2024-01-06 ENCOUNTER — Other Ambulatory Visit (HOSPITAL_COMMUNITY): Payer: Self-pay

## 2024-01-09 ENCOUNTER — Other Ambulatory Visit: Payer: Self-pay

## 2024-01-10 ENCOUNTER — Other Ambulatory Visit: Payer: Self-pay

## 2024-01-12 ENCOUNTER — Other Ambulatory Visit: Payer: Self-pay

## 2024-01-26 ENCOUNTER — Ambulatory Visit: Admitting: Internal Medicine

## 2024-01-26 ENCOUNTER — Other Ambulatory Visit: Payer: Self-pay

## 2024-01-26 VITALS — BP 164/63 | HR 84 | Temp 98.7°F | Ht 65.0 in | Wt 132.8 lb

## 2024-01-26 DIAGNOSIS — E441 Mild protein-calorie malnutrition: Secondary | ICD-10-CM

## 2024-01-26 DIAGNOSIS — K5909 Other constipation: Secondary | ICD-10-CM | POA: Diagnosis not present

## 2024-01-26 DIAGNOSIS — G8929 Other chronic pain: Secondary | ICD-10-CM | POA: Diagnosis not present

## 2024-01-26 DIAGNOSIS — E1159 Type 2 diabetes mellitus with other circulatory complications: Secondary | ICD-10-CM

## 2024-01-26 DIAGNOSIS — R54 Age-related physical debility: Secondary | ICD-10-CM | POA: Diagnosis not present

## 2024-01-26 DIAGNOSIS — L309 Dermatitis, unspecified: Secondary | ICD-10-CM

## 2024-01-26 DIAGNOSIS — I152 Hypertension secondary to endocrine disorders: Secondary | ICD-10-CM

## 2024-01-26 DIAGNOSIS — R011 Cardiac murmur, unspecified: Secondary | ICD-10-CM

## 2024-01-26 DIAGNOSIS — F325 Major depressive disorder, single episode, in full remission: Secondary | ICD-10-CM

## 2024-01-26 DIAGNOSIS — M545 Low back pain, unspecified: Secondary | ICD-10-CM | POA: Diagnosis not present

## 2024-01-26 DIAGNOSIS — E119 Type 2 diabetes mellitus without complications: Secondary | ICD-10-CM

## 2024-01-26 DIAGNOSIS — M7989 Other specified soft tissue disorders: Secondary | ICD-10-CM

## 2024-01-26 DIAGNOSIS — L300 Nummular dermatitis: Secondary | ICD-10-CM

## 2024-01-26 DIAGNOSIS — R634 Abnormal weight loss: Secondary | ICD-10-CM | POA: Diagnosis not present

## 2024-01-26 DIAGNOSIS — R600 Localized edema: Secondary | ICD-10-CM | POA: Insufficient documentation

## 2024-01-26 DIAGNOSIS — Z9181 History of falling: Secondary | ICD-10-CM

## 2024-01-26 LAB — GLUCOSE, CAPILLARY: Glucose-Capillary: 97 mg/dL (ref 70–99)

## 2024-01-26 LAB — POCT GLYCOSYLATED HEMOGLOBIN (HGB A1C): HbA1c, POC (controlled diabetic range): 5.9 % (ref 0.0–7.0)

## 2024-01-26 MED ORDER — TRIAMCINOLONE ACETONIDE 0.1 % EX CREA
1.0000 | TOPICAL_CREAM | Freq: Two times a day (BID) | CUTANEOUS | 3 refills | Status: AC
  Filled 2024-01-26: qty 45, 23d supply, fill #0

## 2024-01-26 MED ORDER — LIDOCAINE 5 % EX PTCH
1.0000 | MEDICATED_PATCH | Freq: Two times a day (BID) | CUTANEOUS | 2 refills | Status: AC
  Filled 2024-01-26: qty 10, 5d supply, fill #0

## 2024-01-26 NOTE — Patient Instructions (Addendum)
 Sydney Gilbert,  I'm happy to see you doing as well as you are! We talked about your weight (you're holding steady, and doing well!), your blood pressure (it's high - your son will help check it at home to see if you have better readings there before changing your medicines), your diabetes (you're not even really diabetic anymore!), your itchy elbow skin (it is eczema dermatitis - I reordered your steroid cream), and your left leg swelling, which we will get an ultrasound to evaluate, to rule out a blood clot (uncommon, but needs to be treated in a certain way, so important that we look for this).  If there is no blood clot, you probably have something called vein insufficiency - when your veins don't effectively return the foot and leg blood back to your heart, so it pools and causes swelling.  Keeping it elevated is the best intervention. I don't think that there is a concern about your back causing this problem, at all.  I will reorder some lidocaine  patches for your back and hips.    Let's get together again in 3 months.    I hope your sister gets home safely!  Take care and stay well,  Dr. Trudy

## 2024-01-26 NOTE — Assessment & Plan Note (Signed)
 Remains frail though weight gain and stabilization are encouraging. Her most debilitating dx contributing to frailty is her back pain and deformity.

## 2024-01-26 NOTE — Assessment & Plan Note (Signed)
 Gradual onset of uncomfortable swelling; mild on exam, though she is a very frail lady and it is noticeable compared to R.  No discoloration or temperature difference.  Knee and ankle joint are normal on exam.  No visible or palpable venous cords or varicosities. No previous hx of DVT but will obtain doppler. Risk would be immobility.

## 2024-01-26 NOTE — Assessment & Plan Note (Addendum)
 Uncontrolled at 164/63 on prescribed amlodipine  10 mg daily.  She believes she took her meds this morning (adherence packaging; she was struggling to read the tiny print on her med packages as of last visit). Will avoid diuretics to minimize bathroom trips (fall risk). Her son is going to check her pressures with home cuff later today and call the office with readings.  Next step would be ACEI/ARB for dual indication renal protection. Lisinopril  40 mg daily was stopped during [facility] admission in 2024.  I suspect she had AKI.  Would start at a lower dose - no changes made today.

## 2024-01-26 NOTE — Assessment & Plan Note (Signed)
 Regained and stable.  Will resolved problem.

## 2024-01-26 NOTE — Assessment & Plan Note (Signed)
 A1c 5.9 on no medication.  No complications.  eGFR greater than 60 in 09/2023.  Obtaining urine sample to evaluate her urine protein creatinine ratio would be very difficult given her back abnormalities.  I am not pursuing this as it is very unlikely that CKD will be an important problem in her remaining life. Goals of care are optiization of function and minimization of pain, not longevity.

## 2024-01-26 NOTE — Assessment & Plan Note (Signed)
 Weight stabilized; she is maintaining after her 20# desireable weight gain. Continue mirtazapine , with added benefits for sleep and mood.

## 2024-01-26 NOTE — Assessment & Plan Note (Signed)
 Uses her rollator faithfully. No falls since last visit.  Meds reviewed.  Continue to monitor.

## 2024-01-26 NOTE — Assessment & Plan Note (Signed)
 Heart murmur is predominantly systolic, though echo 2024 noted aortic regurg. Radiation to R carotid vs radiation of murmur. Lungs are clear, no positional dizziness. Her LE edema is asymmetric and not felt to be hypervolemia/cardiac-related. Taking a stepwise, reasoned approach with testing.

## 2024-01-26 NOTE — Assessment & Plan Note (Signed)
 Itchy eczematous lesions have returned after running out of steroid medicine.  Triamcinolone  renewed.  This is a presumptive diagnosis-she has not had biopsy.

## 2024-01-26 NOTE — Assessment & Plan Note (Signed)
 Mood improved with mirtazapine  which will remain for this indication as well as for appetite and weight maintenance

## 2024-01-26 NOTE — Assessment & Plan Note (Signed)
 Bowel function is comfortable; she takes prune juice prn.  Lubiprostone  is prescribed - didn't bring meds today so unsure if she's taking it.

## 2024-01-26 NOTE — Progress Notes (Signed)
 83 y.o. Sydney Gilbert is here for routine 3-4 m follow-up of chronic problems including frailty, severe chronic back pain following multiple surgeries, related gait abnormality hypertension, and diet-controlled T2DM.  Since last visit patient has had no other medical visits.   Since her last visit together, her sister, with whom she lives, is currently finishing up SNF for recurrent falls and a hip fracture.  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  No falls or near falls, but she is very careful; moves slowly.   New problem is mildly uncomfortable left swelling below the knee-she wonders if it is a consequence of her sacral and back Biggest problem in her mind is her itchy elbow skin condition; ran out of the topical.  No problems with appetite, sleep, chewing or swallowing, digestion, constipation (managed with prune juice), or voiding.  No falls or near falls though she moves very carefully always with her rollator.   Patient Active Problem List   Diagnosis Date Noted   Chronic hip pain, left 10/12/2023   At high risk for falls 10/12/2023   Constipation, chronic 03/04/2023   Chronic lower back pain 03/04/2023   Weight loss, abnormal 02/24/2023   Frailty syndrome in geriatric patient 02/24/2023   Cognitive impairment 01/14/2023   Aortic regurgitation 01/14/2023   Protein calorie malnutrition (HCC) 01/14/2023   Impaired functional mobility, balance, and endurance 01/14/2023   Depression    Type 2 diabetes, diet controlled (HCC) 09/03/2019   Hypertension associated with diabetes (HCC) 09/03/2019   HLD (hyperlipidemia) 09/03/2019   Acquired kyphoscoliosis 06/24/2017   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    Current Outpatient Medications:    acetaminophen  (TYLENOL  8 HOUR) 650 MG CR tablet, Take 2 tablets (1,300 mg total) by mouth 2 (two)  times daily., Disp: 120 tablet, Rfl: 11   amLODipine  (NORVASC ) 10 MG tablet, Take 1 tablet (10 mg total) by mouth daily., Disp: 90 tablet, Rfl: 3   cholecalciferol  (VITAMIN D3) 25 MCG (1000 UNIT) tablet, Take 1 tablet (1,000 Units total) by mouth daily., Disp: 90 tablet, Rfl: 3   diclofenac  Sodium (VOLTAREN ) 1 % GEL, Apply 4 g topically 4 (four) times daily as needed., Disp: 150 g, Rfl: 8   gabapentin  (NEURONTIN ) 300 MG capsule, Take 1 capsule (300 mg total) by mouth at bedtime., Disp: 30 capsule, Rfl: 11   lidocaine  (LIDODERM ) 5 %, Place 1 patch onto the skin every 12 (twelve) hours. Remove & Discard patch within 12 hours or as directed by MD, Disp: 10 patch, Rfl: 2   lubiprostone  (AMITIZA ) 8 MCG capsule, Take 1 capsule (8 mcg total) by mouth daily with breakfast., Disp: 60 capsule, Rfl: 11   mirtazapine  (REMERON ) 15 MG tablet, Take 1 tablet (15 mg total) by mouth at bedtime., Disp: 30 tablet, Rfl: 11   omeprazole  (PRILOSEC ) 20 MG capsule, Take 1 capsule (20 mg total) by mouth daily., Disp: 30 capsule, Rfl: 11   triamcinolone  cream (KENALOG ) 0.1 %, Apply 1 Application topically 2 (two) times daily. For itchy rash, Disp: 30 g, Rfl: 1  Functional Status:  Impaired physical function and is assisted with IADLs as well.  Uses rolling walker.  When standing as in the kitchen or bathroom to do tasks, must always have 1 hand steadying herself.  Lives with an older sister who is experiencing dementia, though this sister does most of the cooking.  *At  time of visit 01/26/24, sister was completing SNF for fall with hip fracture.  She has thus been alone in the home with frequent check ins by her family.  No local family but her children are very involved and usually make the trips to [facility] for medical appts and to help stock groceries and help with shopping (son Lonni in [place], son Tod in [place], [name].  Walks with rollator. Was formerly a participant at [employer] [facility] [employer] [facility]; declined reenrollment. Cancelled  MOW as she was unable to eat the meals.    Objective  BP (!) 163/65 (BP Location: Right Arm, Patient Position: Sitting, Cuff Size: Small)   Pulse 94   Temp 98.7 F (37.1 C) (Oral)   Ht 5' 5 (1.651 m)   Wt 132 lb 12.8 oz (60.2 kg)   SpO2 100%   BMI 22.10 kg/m   Exam: Sydney Gilbert appears well today and says I am doing all right.  Her weight is stable from 4 months ago having leveled off after.  Of appropriate weight gain following interventions to address unintentional weight loss several months ago.  She has a smiling affect, quiet demeanor, interacts appropriately and has no difficulty with hearing at conversational volume.  She either has a right carotid bruit (not on the left) versus radiation of a systolic murmur at the base.  Despite having a known aortic regurgitation, a diastolic murmur was not appreciated.  Heart RRR.  Lungs are clear throughout.  There is no JVD.  On the and extensor surfaces of each elbow she has irregular shaped hyperpigmented plaques with evidence of excoriations.  No surrounding inflammation.  Lower extremity exam: There is mild edema of the left but not of the right, involving primarily the ankle and more distal leg below the knee.  There is no temperature difference between the legs.  There is no knee effusion.  The only tenderness is elicited with palpation by squeeze over the circumference of the lower leg.  The ankle is mobile without inflammation or pain.  Feet are warm with reduced pedal pulses.  No concerning lesions.  She was wearing well-fitting lace up leisure shoes.  Pushes off from sitting position and exhibits a chronic severely flexed posture.  Demonstrates ability to safely use her walker to back up to a chair, reach back to lower herself smoothly to seated position.  Gait is stable using the walker.  Problems addressed today:  Type 2 diabetes, diet controlled (HCC) Assessment & Plan: A1c 5.9 on no medication.  No complications.  eGFR greater than 60  in 09/2023.  Obtaining urine sample to evaluate her urine protein creatinine ratio would be very difficult given her back abnormalities.  I am not pursuing this as it is very unlikely that CKD will be an important problem in her remaining life. Goals of care are optiization of function and minimization of pain, not longevity.  Orders: -     POCT glycosylated hemoglobin (Hb A1C) Chronic bilateral low back pain, unspecified whether sciatica present -     Lidocaine ; Place 1 patch onto the skin every 12 (twelve) hours. Remove & Discard patch within 12 hours or as directed by MD  Dispense: 10 patch; Refill: 2 At high risk for falls Assessment & Plan: Uses her rollator faithfully. No falls since last visit.  Meds reviewed.  Continue to monitor.  Constipation, chronic Assessment & Plan: Bowel function is comfortable; she takes prune juice prn.  Lubiprostone  is prescribed - didn't bring  meds today so unsure if she's taking it.  Frailty syndrome in geriatric patient Assessment & Plan: Remains frail though weight gain and stabilization are encouraging. Her most debilitating dx contributing to frailty is her back pain and deformity.  Mild protein-calorie malnutrition (HCC) Assessment & Plan: Weight stabilized; she is maintaining after her 20# desireable weight gain. Continue mirtazapine , with added benefits for sleep and mood.  Weight loss, abnormal Assessment & Plan: Regained and stable.  Will resolved problem. Nummular eczematous dermatitis Assessment & Plan: Itchy eczematous lesions have returned after running out of steroid medicine.  Triamcinolone  renewed.  This is a presumptive diagnosis-she has not had biopsy. -     Triamcinolone  Acetonide; Apply 1 Application topically 2 (two) times daily. For itchy rash on arms  Dispense: 45 g; Refill: 3 Major depressive disorder in remission, unspecified whether recurrent [facility]) Assessment & Plan: Mood improved with mirtazapine  which will remain for this  indication as well as for appetite and weight maintenance Heart murmur Assessment & Plan: Heart murmur is predominantly systolic, though echo 2024 noted aortic regurg. Radiation to R carotid vs radiation of murmur. Lungs are clear, no positional dizziness. Her LE edema is asymmetric and not felt to be hypervolemia/cardiac-related. Taking a stepwise, reasoned approach with testing.  Edema of left lower leg Assessment & Plan: Gradual onset of uncomfortable swelling; mild on exam, though she is a very frail lady and it is noticeable compared to R.  No discoloration or temperature difference.  Knee and ankle joint are normal on exam.  No visible or palpable venous cords or varicosities. No previous hx of DVT but will obtain doppler. Risk would be immobility.  -     VAS US  LOWER EXTREMITY VENOUS (DVT); Future Hypertension associated with diabetes (HCC) Assessment & Plan: Uncontrolled at 164/63 on prescribed amlodipine  10 mg daily.  She believes she took her meds this morning (adherence packaging; she was struggling to read the tiny print on her med packages as of last visit). Will avoid diuretics to minimize bathroom trips (fall risk). Her son is going to check her pressures with home cuff later today and call the office with readings.  Next step would be ACEI/ARB for dual indication renal protection. Lisinopril  40 mg daily was stopped during [facility] admission in 2024.  I suspect she had AKI.  Would start at a lower dose - no changes made today.      No follow-ups on file.

## 2024-01-27 ENCOUNTER — Encounter (HOSPITAL_COMMUNITY)

## 2024-01-27 ENCOUNTER — Other Ambulatory Visit: Payer: Self-pay | Admitting: Internal Medicine

## 2024-01-27 ENCOUNTER — Other Ambulatory Visit: Payer: Self-pay

## 2024-01-27 DIAGNOSIS — R1013 Epigastric pain: Secondary | ICD-10-CM

## 2024-01-27 DIAGNOSIS — E559 Vitamin D deficiency, unspecified: Secondary | ICD-10-CM

## 2024-01-27 DIAGNOSIS — I152 Hypertension secondary to endocrine disorders: Secondary | ICD-10-CM

## 2024-01-27 DIAGNOSIS — G8929 Other chronic pain: Secondary | ICD-10-CM

## 2024-01-30 ENCOUNTER — Other Ambulatory Visit (HOSPITAL_COMMUNITY): Payer: Self-pay

## 2024-01-30 MED ORDER — ACETAMINOPHEN ER 650 MG PO TBCR
1300.0000 mg | EXTENDED_RELEASE_TABLET | Freq: Two times a day (BID) | ORAL | 11 refills | Status: AC
  Filled 2024-02-07: qty 120, 30d supply, fill #0
  Filled 2024-03-08: qty 120, 30d supply, fill #1
  Filled 2024-04-03: qty 120, 30d supply, fill #2
  Filled 2024-04-26: qty 120, 30d supply, fill #3
  Filled 2024-06-12: qty 120, 30d supply, fill #4
  Filled 2024-07-11: qty 120, 30d supply, fill #5

## 2024-01-30 MED ORDER — AMLODIPINE BESYLATE 10 MG PO TABS
10.0000 mg | ORAL_TABLET | Freq: Every day | ORAL | 3 refills | Status: AC
  Filled 2024-02-07: qty 30, 30d supply, fill #0
  Filled 2024-03-08: qty 30, 30d supply, fill #1
  Filled 2024-04-03: qty 30, 30d supply, fill #2
  Filled 2024-04-26: qty 30, 30d supply, fill #3
  Filled 2024-06-12: qty 30, 30d supply, fill #4
  Filled 2024-07-11: qty 30, 30d supply, fill #5

## 2024-01-30 MED ORDER — OMEPRAZOLE 20 MG PO CPDR
20.0000 mg | DELAYED_RELEASE_CAPSULE | Freq: Every day | ORAL | 11 refills | Status: AC
  Filled 2024-02-07: qty 30, 30d supply, fill #0
  Filled 2024-03-08: qty 30, 30d supply, fill #1
  Filled 2024-04-03: qty 30, 30d supply, fill #2
  Filled 2024-04-26: qty 30, 30d supply, fill #3
  Filled 2024-06-12: qty 30, 30d supply, fill #4
  Filled 2024-07-11: qty 30, 30d supply, fill #5

## 2024-01-30 MED ORDER — VITAMIN D 25 MCG (1000 UNIT) PO TABS
1000.0000 [IU] | ORAL_TABLET | Freq: Every day | ORAL | 3 refills | Status: AC
  Filled 2024-02-07: qty 30, 30d supply, fill #0
  Filled 2024-03-08: qty 30, 30d supply, fill #1
  Filled 2024-04-03: qty 30, 30d supply, fill #2
  Filled 2024-04-26: qty 30, 30d supply, fill #3
  Filled 2024-06-12: qty 30, 30d supply, fill #4
  Filled 2024-07-11: qty 30, 30d supply, fill #5

## 2024-01-31 ENCOUNTER — Ambulatory Visit (HOSPITAL_COMMUNITY): Admission: RE | Admit: 2024-01-31 | Source: Ambulatory Visit

## 2024-01-31 ENCOUNTER — Other Ambulatory Visit: Payer: Self-pay

## 2024-02-02 ENCOUNTER — Ambulatory Visit (HOSPITAL_COMMUNITY)
Admission: RE | Admit: 2024-02-02 | Discharge: 2024-02-02 | Disposition: A | Discharge status: Home / Self Care | Source: Ambulatory Visit | Attending: Internal Medicine | Admitting: Internal Medicine

## 2024-02-02 DIAGNOSIS — M7989 Other specified soft tissue disorders: Secondary | ICD-10-CM | POA: Insufficient documentation

## 2024-02-03 ENCOUNTER — Ambulatory Visit: Payer: Self-pay | Admitting: Internal Medicine

## 2024-02-07 ENCOUNTER — Other Ambulatory Visit: Payer: Self-pay

## 2024-02-08 ENCOUNTER — Other Ambulatory Visit: Payer: Self-pay

## 2024-02-28 ENCOUNTER — Other Ambulatory Visit: Payer: Self-pay

## 2024-03-08 ENCOUNTER — Other Ambulatory Visit: Payer: Self-pay

## 2024-03-09 ENCOUNTER — Other Ambulatory Visit: Payer: Self-pay

## 2024-03-28 ENCOUNTER — Other Ambulatory Visit: Payer: Self-pay

## 2024-03-28 ENCOUNTER — Other Ambulatory Visit: Payer: Self-pay | Admitting: Internal Medicine

## 2024-03-28 DIAGNOSIS — M545 Low back pain, unspecified: Secondary | ICD-10-CM

## 2024-03-28 DIAGNOSIS — R634 Abnormal weight loss: Secondary | ICD-10-CM

## 2024-03-28 MED ORDER — GABAPENTIN 300 MG PO CAPS
300.0000 mg | ORAL_CAPSULE | Freq: Every day | ORAL | 6 refills | Status: AC
  Filled 2024-03-28 – 2024-04-03 (×2): qty 30, 30d supply, fill #0
  Filled 2024-04-26: qty 30, 30d supply, fill #1
  Filled 2024-06-12: qty 30, 30d supply, fill #2
  Filled 2024-07-11: qty 30, 30d supply, fill #3

## 2024-03-28 MED ORDER — MIRTAZAPINE 15 MG PO TABS
15.0000 mg | ORAL_TABLET | Freq: Every day | ORAL | 6 refills | Status: AC
  Filled 2024-03-28 – 2024-04-03 (×2): qty 30, 30d supply, fill #0
  Filled 2024-04-26: qty 30, 30d supply, fill #1
  Filled 2024-06-12: qty 30, 30d supply, fill #2
  Filled 2024-07-11: qty 30, 30d supply, fill #3

## 2024-03-29 ENCOUNTER — Other Ambulatory Visit: Payer: Self-pay

## 2024-03-30 ENCOUNTER — Other Ambulatory Visit (HOSPITAL_COMMUNITY): Payer: Self-pay

## 2024-04-03 ENCOUNTER — Other Ambulatory Visit: Payer: Self-pay

## 2024-04-03 ENCOUNTER — Other Ambulatory Visit (HOSPITAL_COMMUNITY): Payer: Self-pay

## 2024-04-03 ENCOUNTER — Other Ambulatory Visit (HOSPITAL_BASED_OUTPATIENT_CLINIC_OR_DEPARTMENT_OTHER): Payer: Self-pay

## 2024-04-04 ENCOUNTER — Other Ambulatory Visit: Payer: Self-pay

## 2024-04-04 ENCOUNTER — Other Ambulatory Visit (HOSPITAL_COMMUNITY): Payer: Self-pay

## 2024-04-05 ENCOUNTER — Other Ambulatory Visit: Payer: Self-pay

## 2024-04-26 ENCOUNTER — Other Ambulatory Visit: Payer: Self-pay

## 2024-04-30 ENCOUNTER — Other Ambulatory Visit: Payer: Self-pay

## 2024-05-02 ENCOUNTER — Other Ambulatory Visit: Payer: Self-pay

## 2024-05-28 ENCOUNTER — Telehealth: Payer: Self-pay | Admitting: *Deleted

## 2024-05-28 NOTE — Telephone Encounter (Signed)
 Please refer to message below and reach out to patient.  Thanks,  Charsetta  ----- Message -----  From: Jason Avelina Pries  Sent: 05/26/2024   8:50 AM EST  To: Imp Admin  Subject: Appointment Request                              Appointment Request From: Avelina Jason    With Provider: Mliss Pouch, MD [facility] - A Dept Of [facility]]    Preferred Date Range: 05/29/2024 - 06/15/2024    Preferred Times: Any    Reason for visit: Office Visit    Health Maintenance Topic:    Comments:  Ankle and leg swelling.  Chest throbbing.

## 2024-05-28 NOTE — Telephone Encounter (Signed)
 Called pt who stated her ankles and feet are swolllen; x 2 weeks.I asked about the chest throbbing - she stated when she walks, her heart gets weak. She lives with her 83 yo sister. Pt's speech is somewhat difficulty to understand/slow; I asked her about this; she stated this is not usual. I advised her to go to the ER or call 911. Stated she cannot leave her sister alone if she goes to the ER and be admitted. Stated she will call her daughter in [place] to see if someone could stay with her sister then she will call 911.

## 2024-06-07 NOTE — Discharge Summary (Signed)
 "  Hospitalist Discharge Summary   Name: Sydney Gilbert Age: 84 yrs  MRN: [id number] DOB: 1941-05-28  Admit Date: 05/29/2024 Admitting Physician: Zoaib Safdar Rasool, DO  Discharge Date and Time: Thu 06/07/2024 Discharge Physician: Otelia Dolly, M.D    Admission Diagnoses:   Pancytopenia (CMD) [D61.818] Symptomatic anemia [D64.9]   Discharge Diagnoses:   Principal Problem:   Symptomatic anemia Active Problems:   Diabetes mellitus (CMD)   Hypercholesterolemia   Hypertension   Pancytopenia (CMD)   Hypomagnesemia   Impaired functional mobility, balance, gait, and endurance   Elbow swelling, left   Severe thrombocytopenia   Neutropenic fever   Neutropenic fever Resolved Problems:   * No resolved hospital problems. *     *For documentation of patient's Past Medical History and Problem List, see the last page.     Indication for Hospitalization/Hospital Course:   For the details of admission, please see the H&P on 05/29/2024.  For full details, please see progress notes, consult notes and ancillary notes.  The patient's hospital course will be summarized in a problem based approach below.   Symptomatic anemia Pancytopenia (CMD) Severe thrombocytopenia Admitted with severe anemia with a hemoglobin of 5.2, also has pancytopenia Received 2 unit of PRBC.  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   No evidence of hemolysis, Iron deficiency.  No active bleeding, HIV negative, hepatitis panel negative CT chest abdominal pelvis with no evidence of malignancy no cirrhotic changes.   Patient underwent IR guided bone marrow biopsy.  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 myelodysplastic syndrome with increased blasts .  Chromosomal analysis, flow cytometry and MDS panel pending    Patient has left elbow swelling pain tenderness  concern for hemarthrosis CT read with fluid collection of maximum dimention of 6cm, could be hemorrhage given thrombocytopenia, abscess can't be ruled out    Given drop in platelets and significant swelling on the left elbow concern for bleeding  transfused 1 unit of platelets to keep above 20 on 12/30 - Transfused PRBC on 12/31 Discussed with Dr. Volanda and she recommended transfer to main campus in [place].  I have contacted transfer center and discussed with Dr. Alto who has graciously accepted to leukemia unit pending bed availability - contacted main campus again on 12/31 for transfer. Accepted by Dr. Perri    Neutropenic fever Patient had fever up to 102.3 starting on 06/04/2024 Started on vancomycin  and ceftriaxone  on 06/04/2024 which was changed to cefepime and vancomycin  on 06/05/2024 Blood cultures obtained on 06/04/2024 and negative to date. Urine culture ordered but could not be sent due to negative UA (though this was obtained as day after starting antibiotics) per ticket-to-test policy Didn't order aspiration of left elbow fluid collection due to transfer to main campus.  Diabetes mellitus (CMD) On diet control A1c 6.5.  Sliding scale insulin  Consider starting low dose glargine if persistent hyperglycemia.  Hypercholesterolemia Continue atorvastatin.  Hypertension Continue amlodipine   Hypomagnesemia Corrected Impaired functional mobility, balance, gait, and endurance PT recommends rehab placement.  Case manager notified     Elbow swelling, left  CT read with fluid collection of maximum dimention of 6cm, could be hemorrhage given thrombocytopenia, abscess can't be ruled out  Further eval at main campus.  Discharge Condition:   Disposition: Patient transferred to main campus in [place].   Predictive Model Details        17.6% (  Medium)  Factor Value   Calculated 06/07/2024 12:04 11% Braden score 14   Readmission Risk Score v2 Model 8% Number of  hospitalizations in last year 0    8% Number of appointments in last 90 days 0    7% Latest hemoglobin in last 72 hrs 7.6 g/dL    7% Diagnosis of fluid or electrolyte disorder present      Physical Exam at Discharge   BP (!) 138/55 (BP Location: Right arm, Patient Position: Sitting)   Pulse 106   Temp 98.9 F (37.2 C) (Oral)   Resp 20   Ht 1.549 m (5' 1)   Wt 61.9 kg (136 lb 7.4 oz)   SpO2 99%   BMI 25.78 kg/m    General: Alert, oriented to person only , In NAD  Cardiovascular: Regular rate and rhythm, s1, s2, no murmurs.  Chest/Lungs: Clear to auscultation bilaterally, no wheezes or rhonchi.  Abdomen: Positive bowel sounds. Abdomen soft, nondistended, nontender.    Ext  Left elbow warm, tender, swollen.       Discharge Medications:      Medication List     ASK your doctor about these medications    acetaminophen  500 mg tablet Commonly known as: TYLENOL  Take 1,000 mg by mouth every 6 (six) hours as needed (pain).   amLODIPine  5 mg tablet Commonly known as: NORVASC  Take 5 mg by mouth daily.   aspirin  81 mg EC tablet Take 1 tablet (81 mg total) by mouth daily.   atorvastatin 10 mg tablet Commonly known as: LIPITOR Take 1 tablet (10 mg total) by mouth at bedtime.   gabapentin  300 mg capsule Commonly known as: NEURONTIN  Take 300 mg by mouth at bedtime.   lubiprostone  8 mcg capsule Commonly known as: AMITIZA  Take 8 mcg by mouth daily.   mirtazapine  15 mg tablet Commonly known as: REMERON  Take 15 mg by mouth at bedtime.   omeprazole  20 mg DR capsule Commonly known as: PriLOSEC  Take 20 mg by mouth in the morning.   sennosides-docusate sodium  8.6-50 mg per tablet Commonly known as: PERICOLACE Take 2 tablets by mouth 2 (two) times a day as needed for constipation.          Significant Diagnostic Tests:     CT Angio Chest Pulmonary Embolism  Final Result by Darice Juniper, MD (12/23 1706)  CT ANGIO CHEST PULMONARY EMBOLISM, 05/29/2024 4:37 PM     INDICATION: pancytopenia   COMPARISON: CT on 12/01/2022.    TECHNIQUE: Iodinated contrast was administered intravenously by rapid   injection, with further multislice axial sections acquired in the   pulmonary arterial phase from the thoracic inlet to the upper abdomen.   Coronal maximal intensity projection images were created for comprehensive   analysis and diagnosis of the regional circulation.    All CT scans at [facility] and [facility]   Imaging are performed using radiation dose optimization techniques as   appropriate to a performed exam, including but not limited to one or more   of the following: automatic exposure control, adjustment of the mA and/or   kV according to patient size, use of iterative reconstruction technique.   In addition, our institution participates in a radiation dose monitoring   program to optimize patient radiation exposure.    FINDINGS:     Pulmonary arteries: No pulmonary emboli identified.    Aorta/great vessels: Moderate aortic calcification..    Thoracic inlet/central airways: Thyroid  normal. Airway patent.  Mediastinum/hila/axilla: No adenopathy.    Heart: Cardiomegaly.. No pericardial effusion. Aortic valve and mitral   annular calcification. Heavy coronary calcification.    Lungs/pleura: Diffuse bronchial wall thickening. Scattered subsegmental   atelectasis. Diffuse interlobular septal thickening.    Upper abdomen: Diverticulosis..    Chest wall/MSK: Polyarticular degenerative changes. Partially imaged   fixating hardware in the upper thoracic spine.    IMPRESSION:  1.  No pulmonary embolism.  2.  Cardiomegaly and mild interstitial pulmonary edema.    CT Abdomen Pelvis W Contrast  Final Result by Rafel JULIANNA Medford Risser, MD (12/23 1640)  CT ABDOMEN PELVIS W CONTRAST, 05/29/2024 4:37 PM    INDICATION: pancytopenia   ADDITIONAL HISTORY: None.  COMPARISON: None.    TECHNIQUE: CT images of the  abdomen and pelvis were obtained after   intravenous administration of iodinated contrast. Conventional axial   reconstructions and multiplanar reformatted images were submitted for   review.     FINDINGS:    . Lower Chest: Bibasilar atelectasis.    . Liver: No suspicious focal findings.  . Gallbladder/Biliary: Cholecystectomy changes.  SABRA Spleen: Unremarkable.  . Pancreas: Unremarkable.  . Adrenals: Unremarkable.  . Kidneys: Unremarkable.    . Peritoneum/Mesenteries/Extraperitoneum: No free air. No free fluid or   loculated drainable collection. No pathologically enlarged lymph nodes.  . Gastrointestinal tract: No evidence of obstruction.    . Ureters: Unremarkable.  . Bladder: Unremarkable.  . Reproductive System: Unremarkable.    . Vascular: Diffuse atherosclerotic changes.  . Musculoskeletal: Spinal hardware appear No acute displaced fractures.   Polyarticular degenerative changes. No aggressive focal bony lesions.   Abdominal wall soft tissues unremarkable.    IMPRESSION:  No acute findings. Ancillary findings as described.      CT Head WO Contrast W Quant CT Tiss Character When Performed  Final Result by Lonni Lame Lack, MD (12/23 1640)  CT HEAD WITHOUT CONTRAST, 05/29/2024 4:37 PM    INDICATION: headache     COMPARISON: 11/09/2018    TECHNIQUE: Axial CT images of the brain from skull base to vertex,   including portions of the face and sinuses, were obtained without   contrast. Supplemental 2D reformatted images were generated and reviewed   as needed.    All CT scans at [facility] and [facility]   Imaging are performed using radiation dose optimization techniques as   appropriate to a performed exam, including but not limited to one or more   of the following: automatic exposure control, adjustment of the mA and/or   kV according to patient size, use of iterative reconstruction technique.   In addition, our institution  participates in a radiation dose monitoring   program to optimize patient radiation exposure.    FINDINGS:  Calvarium/skull base: No evidence of acute fracture or destructive lesion.   Mastoids and middle ears demonstrate no substantial mucosal disease.    Paranasal sinuses: No air fluid levels.    Brain: No acute large vascular territory infarct. No mass effect. No   hydrocephalus. No acute hemorrhage. Atherosclerotic calcifications of the   intradural vertebral and cavernous internal carotid arteries.   Periventricular and deep cerebral white matter hypoattenuation likely the   sequela of chronic ischemic microvascular disease.    IMPRESSION:  No acute intracranial abnormality. Although no evidence of acute   infarction, mass, or hemorrhage is seen, CT is relatively insensitive for   the detection of hypoxia/ischemia within the first 24-48 hours, and an MRI  scan may be indicated.    XR Chest 1 View  Final Result by Eulas Carlota Dauer, MD (12/23 1431)  XR CHEST 1 VIEW, 05/29/2024 1:06 PM    INDICATION:chest pain   COMPARISON: Chest x-ray on 12/02/2022.    FINDINGS:     Cardiovascular/lungs/pleura: Worsening lung aeration. Enlarged   cardiomediastinal silhouette and diffuse prominent interstitium noted.   This has progressed from prior exam. No focal consolidation. No large   effusion or pneumothorax.  Other: Cervical spine hardware.    IMPRESSION:    Enlarged cardiac silhouette and prominent pulmonary vasculatures could be   due to low lung volume versus mild interstitial pulmonary edema. Please   correlate clinically.          LABS:  Recent Labs    06/05/24 0839 06/06/24 0147 06/07/24 0243  NA 136 136 137  K 4.2 4.2 4.0  CL 104 105 106  CO2 23 22 24   BUN 24 26* 24  CREATININE 0.85 0.93 0.85  AST 11* 14 14  ALT 7 8 11   PROT 6.9 6.7 6.6  ALBUMIN 3.6 3.4* 3.4*  BILITOT 0.8 0.8 1.2*    Lab Results  Component Value Date   WBC 2.91 (L)  06/07/2024   HGB 7.6 (L) 06/07/2024   HCT 22.5 (L) 06/07/2024   PLT 36 (L) 06/07/2024   CHOL 169 12/02/2022   TRIG 118 12/02/2022   HDL 57 (L) 12/02/2022   ALT 11 06/07/2024   AST 14 06/07/2024   NA 137 06/07/2024   K 4.0 06/07/2024   CL 106 06/07/2024   CREATININE 0.85 06/07/2024   BUN 24 06/07/2024   CO2 24 06/07/2024   HGBA1C 6.5 (H) 05/29/2024     Surgeries/Procedures:     Follow-up Appointments:    No future appointments.       Contact information during this hospitalization (Please re-verify post-discharge): PCP: Mliss Arlean Pouch, MD PCP Phone: [phone]        Medical History[1] Patient Active Problem List   Diagnosis Date Noted   MDS (myelodysplastic syndrome), high grade    (CMD) 06/06/2024   Neutropenic fever 06/05/2024   Neutropenic fever 06/05/2024   MDS (myelodysplastic syndrome)    (CMD) 06/05/2024   Elbow swelling, left 06/04/2024   Severe thrombocytopenia 06/04/2024   Impaired functional mobility, balance, gait, and endurance 06/02/2024   Hypomagnesemia 05/30/2024   Symptomatic anemia 05/29/2024   Pancytopenia (CMD) 05/29/2024   Impaired functional mobility, balance, gait, and endurance 12/03/2022   Chest pain 12/01/2022   Facial swelling 12/07/2019   S/P cervical spinal fusion 01/19/2019   Cervical myelopathy    (CMD) 07/20/2018   Scoliosis/kyphoscoliosis 07/01/2017   Acquired kyphoscoliosis 06/24/2017   Spinal stenosis of thoracolumbar region 06/24/2017   Lateral cystocele 03/07/2015   Left arm pain 11/01/2014   SI (sacroiliac) pain 11/01/2014   Hyperlipidemia 09/27/2013   Postlaminectomy syndrome, lumbar region 09/27/2013   Syndrome affecting cervical region 09/27/2013   Diabetes mellitus (CMD) 07/02/2013   Disorder of sacrum 07/02/2013   Hypertension 07/02/2013   Shingles 07/02/2013   Fitting and adjustment of device 06/04/2013   Disorder of joint 04/09/2013   Lumbar radiculopathy 01/30/2013    Osteoarthrosis, 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   Abdominal pain 11/11/2012   Abnormal blood chemistry 11/11/2012   Anemia 11/11/2012   Benign neoplasm of colon 11/11/2012   Cervicalgia 11/11/2012   Chronic airway obstruction (CMD) 11/11/2012   Depressive disorder 11/11/2012   Dermatitis 11/11/2012   Disorder of bone and cartilage 11/11/2012  Diarrhea 11/11/2012   Medication monitoring encounter 11/11/2012   Encounter for screening for malignant neoplasm of cervix 11/11/2012   Esophageal reflux 11/11/2012   Hypercholesterolemia 11/11/2012   Hypertension, benign 11/11/2012   Hypertonicity of bladder 11/11/2012   Hypopotassemia 11/11/2012   Mixed urge and stress incontinence 11/11/2012   Myalgia and myositis 11/11/2012   Pain in joint, ankle and foot 11/11/2012   Pain in soft tissues of limb 11/11/2012   Spinal stenosis in cervical region 11/11/2012   Spinal stenosis of lumbar region 11/11/2012   Type II diabetes mellitus (CMD) 11/11/2012   Tobacco use disorder 11/11/2012   Uterine prolapse 11/11/2012   Visit for screening mammogram 11/11/2012   Headache 09/25/2010   Routine general medical examination at a health care facility 03/05/2010   Nonproliferative diabetic retinopathy    (CMD) 10/14/2009     Time spent on discharge: 20 minutes.       [1] Past Medical History: Diagnosis Date   Anemia    Depression    Diabetes mellitus    (CMD)    History of transfusion 1962   Hypercholesterolemia    Hypertension    Lateral cystocele    Lumbar stenosis    Osteoarthrosis    Postlaminectomy syndrome, lumbar    Sacral disorder    Shingles    Thyroid  disease   "

## 2024-06-07 NOTE — Nursing Note (Signed)
 Pt. Discharged/transferred to Main campus via [place] after talking to family.

## 2024-06-08 NOTE — Consults (Signed)
 Nutrition Note   PATIENT NAME: Sydney Gilbert DATE: 06/08/2024  TIME: 8:55 AM  Note Type: Brief note Referral Reason: MST 2  Assessment  84 y.o. female with a PMH of diet-controlled T2DM, HLD who presented to [facility] on 12/23 due to chest pain found to be pancytopenic with concern for leukemia.  Transferred to [facility] on 1/1 for further leukemia workup and possible chemo initiation.   Screened for MST 2 for wt loss. Per chart hx, pt as gained 15# in last 11 months. Will reassess at LOS unless otherwise screened.   Wt Readings from Last 15 Encounters:  06/07/24 64.7 kg (142 lb 10.2 oz)  06/07/24 61.9 kg (136 lb 7.4 oz)  07/15/23 57.6 kg (127 lb)      Follow-up Information  Follow-Up Date: 06/17/24 Follow-Up Reason: Re-screen   Contact RD on treatment team. After hours or weekends, use secure chat group: [facility]  [facility] Adult Dietitians [place]  [facility] Dietitians [facility]  [facility] Dietitians [facility]  [facility] Dietitians [facility]  [facility] Dietitians   Marjorie Moats, [place] 06/08/2024 8:55 AM

## 2024-06-12 ENCOUNTER — Other Ambulatory Visit: Payer: Self-pay

## 2024-06-13 ENCOUNTER — Other Ambulatory Visit: Payer: Self-pay

## 2024-06-26 NOTE — H&P (Signed)
 "  [facility] Hospitalist History and Physical  Assessment/Plan: 84 years old female with PMH of high-grade myelodysplastic syndrome, DM, HLD, pseudogout, admitted from SNF on 06/26/2024 with fever, tachycardia, poor p.o. intake.     Assessment & Plan Sepsis    (CMD) The patient has strong evidence of infection and sepsis associated with organ dysfunction evidenced by: acute kidney injury with a creatinine: creatinine:1.55* mg/dL (98/79 8456) with an increase in SCr to >=1.5 times baseline, which is known or presumed to have occurred within the prior 7 days . The patient will be monitored closely and managed with antibiotics, serial labs, and resuscitation.   # Febrile illness:- # Sepsis:-Sepsis criteria includes fever, tachycardia, tachypnea, endorgan dysfunction in the form of elevated creatinine.  Source of sepsis is unclear.  - Follow-up on blood and urine cultures - Continue vancomycin , cefepime IV -hospice consulted.   # Elevated troponin:-Likely secondary demand ischemia.  EKG revealed sinus tachycardia.  # Acute kidney injury:-Baseline creatinine around 0.9, now it is elevated to 1.5. 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  Bone marrow biopsy on 06/01/2024 revealed hypercellular marrow with trilinear dysplasia and increased blasts. Upon goals of care discussion with the family, patient and family have decided to pursue best supportive care and do not wish for further treatment of MDS at this time.  # Severe normocytic anemia # Thrombocytopenia -1 PRBC,1 platelet transfusion.     # Type II DM:-HbA1c was 6.5% on 05/29/2024. -novolog  sliding scale.   # Hyperlipidemia # Pseudogout  Inpatient  DVT prophylaxis:    risks of mechanical & pharmacological prophylaxis outweigh benefits Anticipated disposition: To Skilled Nursing Facility Estimated discharge:     2-3 days  CODE status: Patient is DNR Limited Scope of Treatment. This was discussed on the day of admission.  ___________________________________________________________________  Chief Complaint: Chief Complaint  Patient presents with   Fever   Tachycardia    HPI:  84 years old female with PMH of high-grade myelodysplastic syndrome, DM, HLD, pseudogout, admitted from SNF on 06/26/2024 with fever, tachycardia, poor p.o. intake.   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  She also had pancytopenia, she received 3 units PRBC and 2 units platelet transfusion.  She was also treated for neutropenic fever, infectious workup was negative.  She had left elbow pain, orthopedics did arthrocentesis which revealed calcium  pyrophosphate crystals.  She was treated with prednisone.  She was discharged to SNF.  Patient states that she does not know why she was brought to the ER.  In the ER her Tmax is 103.3, tachycardic, tachypneic.  BP is 111/76.  WBC count is normal.  Hemoglobin low at 6.7 platelets 14.  Influenza, COVID, RSV are negative.  UA revealed 6-10 RBC, 0-5 WBC, negative for leukocyte esterase and nitrate.  CXR revealed-stable left basilar patchy opacity suspicious for atelectasis/aspiration/pneumonia.  Troponin elevated at 93.  EKG revealed sinus tachycardia.  She is treated with vancomycin , cefepime, IV fluids.  ER provider spoke with on-call oncologist who has recommended for supportive therapy based on her goals of care discussion during recent hospitalization.  ER provider ordered for 1 unit PRBC, 1 unit platelet transfusion.  ER provider spoke with patient's son who decided for DNR/limited [place].  She is admitted for further management.  Allergies: Penicillins and Varenicline  Medications:    Medical History: Medical  History[1]  Surgical History: Surgical History[2]  Social History: Social History   Socioeconomic History    Marital status: Widowed    Spouse name: Not on file   Number of children: Not on file   Years of education: Not on file   Highest education level: Not on file  Occupational History   Not on file  Tobacco Use   Smoking status: Every Day    Current packs/day: 0.50    Types: Cigarettes   Smokeless tobacco: Never  Substance and Sexual Activity   Alcohol use: No   Drug use: No   Sexual activity: Not on file  Other Topics Concern   Not on file  Social History Narrative   Not on file   Social Drivers of Health   Living Situation: Low Risk (06/07/2024)   Living Situation    What is your living situation today?: Patient unable to answer    Think about the place you live. Do you have problems with any of the following? Choose all that apply:: None/None on this list  Food Insecurity: Low Risk (05/29/2024)   Food vital sign    Within the past 12 months, you worried that your food would run out before you got money to buy more: Never true    Within the past 12 months, the food you bought just didn't last and you didn't have money to get more: Never true  Transportation Needs: No Transportation Needs (05/29/2024)   Transportation    In the past 12 months, has lack of reliable transportation kept you from medical appointments, meetings, work or from getting things needed for daily living? : No  Utilities: Low Risk (05/29/2024)   Utilities    In the past 12 months has the electric, gas, oil, or water company threatened to shut off services in your home? : No  Safety: Low Risk (05/29/2024)   Safety    How often does anyone, including family and friends, physically hurt you?: Never    How often does anyone, including family and friends, insult or talk down to you?: Never    How often does anyone, including family and friends, threaten you with harm?: Never    How often does anyone, including family and friends, scream or curse at you?: Never  Alcohol Screening: Not At Risk  (06/26/2024)   Alcohol    Audit C Alcohol risk score: 0  Tobacco Use: High Risk (07/15/2023)   Patient History    Smoking Tobacco Use: Every Day    Smokeless Tobacco Use: Never    Passive Exposure: Not on file  Depression: Not on file  Social Connections: Unknown (05/29/2024)   Social Connection and Isolation Panel    Frequency of Communication with Friends and Family: Twice a week    Frequency of Social Gatherings with Friends and Family: Twice a week    Attends Religious Services: 1 to 4 times per year    Active Member of [employer] or Organizations: Patient unable to answer    Attends Banker Meetings: 1 to 4 times per year    Marital Status: Patient declined  Physicist, Medical Strain: Low Risk (05/29/2024)   Overall Financial Resource Strain (CARDIA)    Difficulty of Paying Living Expenses: Not very hard    Family History: Family History[3]  Review of Systems: A complete review of organ systems is negative unless otherwise mentioned in HPI  Labs/Studies: Labs and Studies from the last 24hrs per EMR and Personally Reviewed by me  Physical Exam: Temp:  [100.6 F (38.1 C)-103.3 F (39.6 C)] 100.6 F (38.1 C) Heart Rate:  [122-132] 122 Resp:  [24-27] 24 BP: (111-137)/(66-78) 115/78   GEN: NAD, lying in bed EYES: EOMI,pallor conjunctiva  ENT: dry MM  CV: RRR, no murmurs appreciated PULM: CTA B ABD: soft, NT/ND, +BS EXT: No edema NEURO: No focal deficits PSYCH: A+Ox3,          [1] Past Medical History: Diagnosis Date   Anemia    Depression    Diabetes mellitus    (CMD)    History of transfusion 1962   Hypercholesterolemia    Hypertension    Lateral cystocele    Lumbar stenosis    Osteoarthrosis    Postlaminectomy syndrome, lumbar    Sacral disorder    Shingles    Thyroid  disease   [2] Past Surgical History: Procedure Laterality Date   BACK SURGERY     Procedure: BACK SURGERY   BLADDER TUMOR EXCISION      Procedure: BLADDER TUMOR EXCISION   CHOLECYSTECTOMY     Procedure: CHOLECYSTECTOMY   FOOT SURGERY Bilateral    Procedure: FOOT SURGERY   FRACTURE SURGERY Left    Procedure: FRACTURE SURGERY; Arm   POSTERIOR CERVICAL LAMINECTOMY N/A 11/07/2018   Procedure: POSTERIOR CERVICAL LAMINECTOMY C2-T1, DEPUY and posterior fusion C2-T1;  Surgeon: Marsa MARLA Breeding, MD;  Location: [facility] MAIN OR;  Service: Neurosurgery;  Laterality: N/A;  [3] Family History Problem Relation Name Age of Onset   Hypertension Mother     Cancer Father    "

## 2024-07-02 NOTE — Discharge Summary (Signed)
 [facility] Hospitalist Discharge Summary  Identifying Information:  Sydney Gilbert 1941/01/21 [id number]  Admit date: 06/26/2024  Discharge date: 07/02/2024  Discharge Service: [facility] Hospitalist  Discharge Attending Physician:Bhaskar Pusuluri, MD  Discharge to: Hospice facility  Discharge Diagnoses: Sepsis of unclear etiology Failure to thrive Acute kidney injury High-grade myelodysplastic syndrome Severe normocytic anemia Thrombocytopenia Type II DM Hyperlipidemia Pseudogout DNR/comfort care  Hospital Course:   84 years old female with PMH of high-grade myelodysplastic syndrome, DM, HLD, pseudogout, admitted from SNF on 06/26/2024 with fever, tachycardia, poor p.o. intake.     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  She also had pancytopenia, she received 3 units PRBC and 2 units platelet transfusion.  She was also treated for neutropenic fever, infectious workup was negative.  She had left elbow pain, orthopedics did arthrocentesis which revealed calcium  pyrophosphate crystals.  She was treated with prednisone.  She was discharged to SNF.   She is admitted from SNF with fever, confusion.  She is treated for sepsis, unclear etiology.Influenza, COVID, RSV are negative.  UA revealed 6-10 RBC, 0-5 WBC, negative for leukocyte esterase and nitrate.  CXR revealed-stable left basilar patchy opacity suspicious for atelectasis/aspiration/pneumonia.  Procalcitonin elevated to 21.  She was treated with vancomycin  and cefepime IV.   She also has severe anemia or thrombocytopenia on admission.  ER provider spoke with on-call hematologist who recommended for supportive therapy based on her goals of care discussion during recent hospitalization.  Hemoglobin improved from 6.7-7.0 after 1 PRBC transfusion.   Troponin elevated at 93.  EKG revealed sinus tachycardia.  TSH is normal.   She was also noted to have acute encephalopathy.  pCO2 is 37 on  ABG.  CT of the head did not acute intracranial abnormality.    She was changed to DNR/ Comfort care after speaking with pt's  son/Gilbert,Sydney (one of her healthcare POA's) on 1/21.  She is transferred to inpatient hospice on 1/26.  Predictive Model Details        23.1% (Medium)  Factor Value   Calculated 07/02/2024 08:05 17% Number of appointments in last 90 days 14   Readmission Risk Score v2 Model 12% Number of hospitalizations in last year 2    9% Diagnosis of fluid or electrolyte disorder present    8% Braden score 13    6% Patient age 54 years old      Post Discharge Follow Up Issues:  Not applicable    Procedures: none ______________________________________________________________ Discharge Day Services: BP 143/60 (BP Location: Right arm, Patient Position: Lying)   Pulse (!) 114   Temp 100.3 F (37.9 C) (Oral)   Resp 20   Ht 1.55 m (5' 1.02)   Wt 65.4 kg (144 lb 2.9 oz)   SpO2 97%   BMI 27.22 kg/m  Pt seen on the day of discharge and determined appropriate for discharge. Physical Exam GEN: NAD, lying in bed, breathing on oxygen via nasal cannula. EYES: EOMI,pallor conjunctiva  CV: RRR, no murmurs appreciated PULM: CTA B ABD: soft, NT/ND, +BS EXT: No edema  Condition at Discharge: poor  Length of Discharge: I spent 45  mins in the discharge of this patient. _____________________________________________________________________________ Discharge Medications: Patient Instructions:    Discharge Medications     Stopped Medications    acetaminophen  325 mg tablet Commonly known as: TYLENOL    acyclovir 400 mg tablet Commonly known as: ZOVIRAX   amLODIPine  10 mg tablet Commonly known as: NORVASC   ferrous sulfate 325 mg (65 mg iron) tablet   fluconazole 200 mg tablet Commonly known as: DIFLUCAN   gabapentin  300 mg capsule Commonly known as: NEURONTIN    levoFLOXacin 500 mg tablet Commonly known as: LEVAQUIN   lidocaine  4 % patch Commonly known  as: SALONPAS   lubiprostone  8 mcg capsule Commonly known as: AMITIZA    methocarbamol  500 mg tablet Commonly known as: ROBAXIN    mirtazapine  15 mg tablet Commonly known as: REMERON    omeprazole  20 mg DR capsule Commonly known as: PriLOSEC    predniSONE 10 mg tablet Commonly known as: DELTASONE   sennosides-docusate sodium  8.6-50 mg per tablet Commonly known as: PERICOLACE        _____________________________________________________________________________ Pending Test Results (if blank, then none):    Most Recent Labs:     Lab Results  Component Value Date   WBC 5.13 06/27/2024   HGB 7.0 (L) 06/27/2024   HCT 20.4 (L) 06/27/2024   PLT 10 (LL) 06/27/2024    Lab Results  Component Value Date   CO2 25 06/27/2024   BUN 25 06/27/2024   GLUCOSE 167 (H) 06/27/2024   CREATININE 1.46 (H) 06/27/2024   CALCIUM  7.2 (L) 06/27/2024   ALBUMIN 3.5 06/26/2024   AST 13 06/26/2024   ALT 12 06/26/2024    Lab Results  Component Value Date   NA 137 06/27/2024   K 3.3 (L) 06/27/2024   CL 104 06/27/2024   CO2 25 06/27/2024   BUN 25 06/27/2024   CREATININE 1.46 (H) 06/27/2024   CALCIUM  7.2 (L) 06/27/2024   MG 1.7 (L) 06/19/2024   PHOS 3.3 06/19/2024    Lab Results  Component Value Date   BILITOT 1.2 (H) 06/26/2024   BILIDIR 0.4 (H) 06/08/2024   PROT 6.6 06/26/2024   ALBUMIN 3.5 06/26/2024   ALT 12 06/26/2024   AST 13 06/26/2024    Lab Results  Component Value Date   INR 1.0 06/08/2024   Hospital Radiology:  ECG 12 lead Result Date: 06/27/2024 Ventricular Rate                   129       BPM                 Atrial Rate                        129       BPM                 P-R Interval                       126       ms                  QRS Duration                       76        ms                  Q-T Interval                       308       ms                  QTC Calculation Bazett             451  ms                  Calculated P Axis                  56         degrees             Calculated R Axis                  20        degrees             Calculated T Axis                  77        degrees             Sinus tachycardia Otherwise normal Confirmed by Launie Stanford  [id number]  on 06-27-2024 6 48 45 AM  CT Head WO Contrast W Quant CT Tiss Character When Performed Result Date: 06/26/2024 Date of Service: 2024-06-26 19:19:00 EXAM: brain/head CLINICAL HISTORY: AMS. CT HEAD WITHOUT INTRAVENOUS CONTRAST INDICATION:  AMS. TECHNIQUE:  Axial computed tomography images of the head/brain without intravenous contrast.  Sagittal and coronal reformatted images were created and reviewed.  This CT exam was performed using one or more of the following dose reduction techniques:  automated exposure control, adjustment of the mA and/or kV according to patient size, and/or use of iterative reconstruction technique. COMPARISON:  No relevant prior studies available. FINDINGS: Brain:  Unremarkable.  No hemorrhage. Gray-white matter differentiation is preserved. No mass effect. No midline shift. Ventricles:  Unremarkable. Bones/joints:  Unremarkable.  No acute fracture. Soft tissues:  Unremarkable. Sinuses:  Unremarkable as visualized. Mastoid air cells:  Opacified mastoid air cells, left greater than right.   1. No acute intracranial finding. 2. Bilateral left-greater-than-right mastoid air cell effusion.  Automatic exposure control was used as a dose lowering technique. Electronically Signed by: Lynwood Milly GARRE. on 06/26/2024 18:32:51 [place] on workstation ID: [id number] Per PRQS, all CT exams are performed using one or more of the following dose reduction techniques: automated exposure control,adjustment of the mA and/or kV according to patient size, or use of iterative reconstruction technique  CT Chest Abdomen Pelvis W Contrast Result Date: 06/26/2024 Date of Service: 2024-06-26 16:11:00 EXAM: chest/abd/pel CLINICAL HISTORY: sepsis. CT CHEST ABDOMEN AND PELVIS WITH INTRAVENOUS  CONTRAST INDICATION:  sepsis. TECHNIQUE:  Axial computed tomography images of the chest, abdomen and pelvis with intravenous contrast.  Sagittal and coronal reformatted images were created and reviewed.  This CT exam was performed using one or more of the following dose reduction techniques:  automated exposure control, adjustment of the mA and/or kV according to patient size, and/or use of iterative reconstruction technique. COMPARISON:  No relevant prior studies available. FINDINGS: CHEST: Lungs and pleural spaces:  There is a small left pleural effusion.  Adjacent compressive atelectasis in the left lower lobe is noted. There is linear atelectasis in both lower lobes.  Scattered ground-glass opacities are noted bilaterally, particularly in the upper lobes.  There is a 8 mm ground-glass nodule in the anterior right upper lobe.  All these findings are likely infectious or inflammatory. No pneumothorax. Heart: cardiomegaly.  Small low-density pericardial effusion.  Moderate coronary artery calcifications are noted.  Mild calcification of the aortic valve annulus is seen. ABDOMEN: Liver:  Unremarkable.  No mass. Gallbladder and bile ducts:  The gallbladder has been surgically removed.  No ductal dilation. Pancreas:  Unremarkable.  No  ductal dilation.  No mass. Spleen:  Unremarkable.  No splenomegaly. Adrenals:  Unremarkable.  No mass. Kidneys and ureters:  Unremarkable.  No hydronephrosis.  No solid mass. Stomach and bowel:   No obstruction.  Concentric wall thickening of the descending colon is seen suggesting colitis. PELVIS: Appendix:  No findings to suggest acute appendicitis.  The appendix is normal. Spouse baby Bladder:  Unremarkable.  No mass. Reproductive:  Unremarkable as visualized. CHEST, ABDOMEN and PELVIS: Intraperitoneal space:  Unremarkable.  No significant fluid collection.  No free air. Bones/joints:  No acute fracture.  Postsurgical changes related to fusion from L4 through S1 are noted.   Degenerative disc changes throughout the spine are seen. Soft tissues:  Generalized anasarca. Vasculature:  Extensive calcification of aorta is noted.  There is extensive serpiginous curving of the abdominal aorta.  No aortic aneurysm. Lymph nodes:  There is an enlarged 11 mm right upper subpectoral lymph node.   Left pleural effusion Bibasilar atelectasis Ground-glass opacities bilaterally favoring infection or inflammation Findings favoring colitis of the descending colon Individual mildly enlarged lymph node in the right upper subpectoral region Automatic exposure control was used as a dose lowering technique. Electronically Signed by: Olam Vinita BRUNS. on 06/26/2024 16:37:52 US [place] on workstation ID: [id number] Per PRQS, all CT exams are performed using one or more of the following dose reduction techniques: automated exposure control,adjustment of the mA and/or kV according to patient size, or use of iterative reconstruction technique  XR Chest 1 View Result Date: 06/26/2024 XR CHEST 1 VIEW, 06/26/2024 4:04 PM INDICATION:fever, tachycardia COMPARISON: Chest radiograph 06/12/2024 FINDINGS: Supportive devices: None. Cardiovascular/lungs/pleura: Similar cardiomediastinal silhouette. Persistent low lung volumes with resultant accentuation of vascular markings and cardiopericardial silhouette. Similar left basilar patchy airspace opacity. No new focal opacity. No interval osseous change. Other: Partially imaged cervical fusion hardware. No interval osseous change.   *  No substantial change of low lung volume with accentuation of interstitial marking. *  Similar left basilar patchy opacity, may represent atelectasis, aspiration or infection in appropriate clinical setting.   ECG 12 lead Result Date: 06/19/2024 Ventricular Rate                   112       BPM                 Atrial Rate                        112       BPM                 P-R Interval                       130       ms                  QRS  Duration                       86        ms                  Q-T Interval                       326       ms                  QTC  Calculation Bazett             444       ms                  Calculated P Axis                  68        degrees             Calculated R Axis                  12        degrees             Calculated T Axis                  55        degrees             Sinus tachycardia with occasional Premature ventricular complexes Otherwise normal ECG When compared with ECG of 29-May-2024 19 49, Premature ventricular complexes are now present Premature atrial complexes are no longer present Confirmed by Marcine Grice  [id number]  on 06-19-2024 9 32 45 PM  XR Elbow 2 Views Right Result Date: 06/14/2024 X-RAY ELBOW RIGHT (2 VIEWS), 06/13/2024 3:16 PM INDICATION: Right elbow pain COMPARISON: None.   1.  No acute fracture or traumatic malalignment. 2.  Extensive dorsal elbow soft tissue which may be seen with contusion or bursitis. 3.  Moderate to large sterility-indeterminate joint effusion. 4.  Mild degenerative changes of the ulnotrochlear joint. 5.  Chondrocalcinosis.  CT Sinus W Contrast Result Date: 06/14/2024 CT FACE/SINUSES WITH CONTRAST, 06/13/2024 2:00 PM INDICATION: neutropenic fever COMPARISON: CT head 05/29/2024 TECHNIQUE: High-resolution axial and/or coronal CT images of the maxillofacial region were obtained after intravenous administration of iodine-based contrast, using 2D reformations as needed. Portions of the brain, skull base, and upper neck were also included in the imaging field. All CT scans at [facility] and [facility] are performed using radiation dose optimization techniques as appropriate to a performed exam, including but not limited to one or more of the following: automatic exposure control, adjustment of the mA and/or kV according to patient size, use of iterative reconstruction technique. In addition, our institution participates in a  radiation dose monitoring program to optimize patient radiation exposure. FINDINGS: Superficial soft tissues: No focal lesion identified. Orbits: Globes are bilaterally symmetric in size and contour. Symmetric appearance of the extraocular muscles and orbital fat. Mineralization along the bilateral optic nerves. Sinuses: Mild mucosal thickening in the left sphenoid sinus. No radiographic evidence of invasive fungal sinusitis. No destructive osseous lesions. Midface/nasal cavity: No mass lesion identified. Mandible: No aggressive lesion identified. Partially imaged brain: Similar hypoattenuation and volume loss of the left anterior temporal lobe likely reflects encephalomalacia.   No findings specific for acute invasive fungal sinusitis. Bilateral optic nerve sheath mineralization, nonspecific.   CT Chest WO Contrast Result Date: 06/13/2024 CT CHEST WO CONTRAST, 06/13/2024 1:59 PM INDICATION: neutropenic fever COMPARISON: CT chest on 06/09/2024 TECHNIQUE: Multislice axial images were obtained through the chest without administration of iodinated intravenous contrast material. Multi-planar reformatted images were generated for additional analysis. Nongated technique limits cardiac detail. All CT scans at [facility] and [facility] are performed using radiation dose optimization techniques as appropriate to a performed exam, including but not limited to one or more of the following: automatic exposure control, adjustment  of the mA and/or kV according to patient size, use of iterative reconstruction technique. In addition, our institution participates in a radiation dose monitoring program to optimize patient radiation exposure. FINDINGS: Thoracic inlet/central airways: Visualized thyroid  gland is normal.  Central airway is patent.  No cervical lymphadenopathy. Mediastinum/hila/axilla: Few subcentimeter mediastinal lymph nodes. Heart/vessels: Normal heart size. Trace pericardial  effusion.  There is heavy coronary artery calcification. Aorta is normal in caliber.  Moderate atherosclerotic calcification. Main pulmonary artery is not dilated. Lungs/pleura: New small left pleural effusion and adjacent atelectasis. Scattered subsegmental atelectasis. Upper abdomen: Similar hypodense lesion within the right kidney, likely cyst. No acute process in the visualized upper abdomen. Chest wall/MSK: fixating screws in the upper thoracic spine. Polyarticular degenerative changes. No acute osseous findings or aggressive osseous lesion.   New small left pleural effusion with adjacent atelectasis.   XR Chest 1 View Result Date: 06/13/2024 XR CHEST 1 VIEW, 06/12/2024 4:40 PM INDICATION:neutropenic fever COMPARISON: CT cap 06/09/2024 FINDINGS: Supportive devices: None. Cardiovascular/lungs/pleura: Low lung volumes, accentuating the cardiopericardial silhouette and interstitial markings. Vascular calcifications. Left basilar patchy opacity. No pneumothorax. Other: No interval osseous abnormality.   1.  Hypoventilatory lung changes, accentuating interstitial markings. Difficult to exclude superimposed pulmonary interstitial edema or atypical infection in the appropriate clinical setting. 2.  Left basilar patchy opacity, possibly a combination of atelectasis and/or aspiration with possible small left pleural effusion.  CT Elbow Left WO Contrast Result Date: 06/12/2024 CT ELBOW LEFT WITHOUT CONTRAST, 06/09/2024 3:00 PM INDICATION: swelling COMPARISON: CT from 06/04/2024. TECHNIQUE: Thin-section images of the left elbow were obtained without intravenous contrast. Supplemental 2D reformatted images were generated and reviewed as needed. All CT scans at [facility] and [facility] Imaging are performed using radiation dose optimization techniques as appropriate to a performed exam, including but not limited to one or more of the following: automatic exposure control, adjustment of the  mA and/or kV according to patient size, use of iterative reconstruction technique. In addition, our institution participates in a radiation dose monitoring program to optimize patient radiation exposure. FINDINGS: . Significant interval reduction in nonspecific ill-defined subcutaneous edema, reticulations and fat stranding about the dorsal radial aspect of the elbow joint. No organized collection or encapsulation. No intra- or intermuscular fluid or edema. No overlying skin thickening. . No soft tissue gas locules. . No erosive bony changes to suggest for osteomyelitis. . No acute fracture or dislocation. Partially imaged ORIF of mid diaphysis of ulna. . Similar small elbow joint effusion. . Mild elbow joint degenerative changes. . Lateral humeral epicondyle enthesophytes. . Radial tuberosity enthesophytes. . Osteopenia. Additional: . Lower lumbar spine and cervical fusion hardware, noted on scout,   1.  Interval reduction in nonspecific ill-defined subcutaneous edema, reticulation, and fat stranding about the dorsal radial aspect of the elbow joint. No organized collection or encapsulation. No soft tissue gas. 2.  No CT findings of osteomyelitis.   CT Chest Abdomen Pelvis W Contrast Result Date: 06/09/2024 CT CHEST ABDOMEN PELVIS W CONTRAST, 06/09/2024 3:01 PM INDICATION:  neutropenic fever COMPARISON: CT-guided bone marrow biopsy 06/01/2024; CT abdomen and pelvis 05/29/2024. TECHNIQUE: CT images of the chest, abdomen, and pelvis were obtained following intravenous administration of iodinated contrast. Conventional axial reconstructions and multiplanar reformatted images were submitted for review.  FINDINGS: . Thoracic inlet: Within normal limits. . Chest wall/Axilla: Scattered mildly prominent, likely reactive bilateral axillary lymph nodes. . Central airways: Patent. . Mediastinum/Hila: No enlarged lymphadenopathy. Small hiatal hernia. SABRA Heart: Heart size within  normal limits. No pericardial effusion. Heavy  coronary artery calcifications. . Vessels: Aorta normal in caliber. Diffuse calcified atherosclerosis of the aortic arch. No central pulmonary embolism. . Lungs/Pleura: Similar bibasilar dependent atelectasis. No consolidation or pleural effusion. No suspicious pulmonary nodules. . Liver: No suspicious focal findings. . Gallbladder/Biliary: Mildly prominent biliary ducts, likely due to status post cholecystectomy. SABRA Spleen: Unremarkable. . Pancreas: Unremarkable. . Adrenals: Unremarkable. . Kidneys: Symmetric enhancement. No hydronephrosis. . Peritoneum/Mesenteries/Extraperitoneum: No free air. No free fluid or loculated drainable collection. No pathologically enlarged lymph nodes. . Gastrointestinal tract: No evidence of obstruction. Colonic diverticulosis, without diverticulitis. Normal appendix. SABRA Ureters: Unremarkable. . Bladder: Partially decompressed. . Reproductive System: Unremarkable. . Vascular: Tortuous abdominal aorta with diffuse aortobiiliac calcified atherosclerosis, with involvement of main branches, predominantly at the origin of this procedure mesenteric artery (series 901, image 82). No aortic aneurysm. . Musculoskeletal: Partially imaged posterior cervical spine fixation hardware. Posterior fixation hardware spanning L4-S1. Lumbar spine levoscoliosis. No acute abnormality. Multilevel degenerative changes of the spine. Polyarticular degenerative changes. No aggressive focal bony lesions. Abdominal wall soft tissues unremarkable.   1.  No acute findings within the imaged chest, abdomen or pelvis. 2.  Stable ancillary findings as above.   XR Chest 1 View Result Date: 06/09/2024 XR CHEST 1 VIEW, 06/08/2024 6:54 PM INDICATION:febrile neutropenia COMPARISON: 05/26/2024 CT FINDINGS: Supportive devices: None Cardiovascular/lungs/pleura: Similar appearance of cardia mediastinal silhouette. Mild pulmonary interstitial edema. No focal consolidation. No pleural effusion or discernible pneumothorax. Other:  No interval osseous changes.   Mild pulmonary interstitial edema. No focal consolidation.   CT Elbow Left WO Contrast Result Date: 06/06/2024 CT ELBOW LEFT WITHOUT CONTRAST, 06/04/2024 11:30 AM INDICATION: swelling,hemarthrosis COMPARISON: None. TECHNIQUE: Thin-section images of the left elbow were obtained without intravenous contrast. Supplemental 2D reformatted images were generated and reviewed as needed. All CT scans at [facility] and [facility] are performed using radiation dose optimization techniques as appropriate to a performed exam, including but not limited to one or more of the following: automatic exposure control, adjustment of the mA and/or kV according to patient size, use of iterative reconstruction technique. In addition, our institution participates in a radiation dose monitoring program to optimize patient radiation exposure. Limitations: Patient was imaged with the arm over the body with resultant beam hardening and photon starvation artifacts degrading the image quality and limiting the evaluation of its fine details. FINDINGS: . Nonspecific ill-defined subcutaneous edema about the dorsal radial aspect of the elbow joint, measuring 6 x 2 cm in maximal axial diameters. No organized collection or encapsulation. No intra- or intermuscular fluid or edema. No overlying skin thickening. . Few prominent epitrochlear lymph nodes. . No soft tissue gas locules. . No erosive bony changes to suggest for osteomyelitis. . No acute fracture or dislocation. . Mild elbow joint effusion. . Mild elbow joint degenerative changes. . Lateral humeral epicondyle enthesophytes. . Radial tuberosity enthesophytes. . Osteopenia. Additional: . Lower lumbar spine fusion hardware, noted on scout, . Left ulnar diaphysis ORIF, noted on scout.   1.  Ill-defined subcutaneous edema about the dorsal radial aspect of elbow joint; findings are nonspecific and could represent dependent  edema, hemorrhage, and/or cellulitis, depending on the clinical findings. This area measures approximately 6 cm in maximal dimension. 2.  No acute fracture or dislocation. 3.  Mild elbow joint degenerative changes. Small effusion. 4.  No soft tissue gas. 5.  No erosive bony changes to suggest for osteomyelitis.    _____________________________________________________________________________ Discharge Instructions:  Discharge Orders     DNAR per Portable DNAR     Hospice Coordination     Details:    Actions: Application for Full Hospice Services   Lifting Limits:     Details:    Lifting Limits: No lifting limits   Regular diet     Details:    Diet type: Regular        No future appointments.

## 2024-07-11 ENCOUNTER — Other Ambulatory Visit: Payer: Self-pay | Admitting: Internal Medicine

## 2024-07-11 ENCOUNTER — Other Ambulatory Visit: Payer: Self-pay

## 2024-07-11 DIAGNOSIS — K5904 Chronic idiopathic constipation: Secondary | ICD-10-CM

## 2024-07-11 DIAGNOSIS — K5900 Constipation, unspecified: Secondary | ICD-10-CM

## 2024-07-11 NOTE — Telephone Encounter (Signed)
 Patient is under care of inpatient hospice

## 2024-07-12 ENCOUNTER — Other Ambulatory Visit: Payer: Self-pay

## 2024-07-12 ENCOUNTER — Encounter: Payer: Self-pay | Admitting: Pharmacist

## 2024-07-13 ENCOUNTER — Other Ambulatory Visit: Payer: Self-pay
